# Patient Record
Sex: Female | Born: 1980 | Race: White | Hispanic: No | Marital: Married | Smoking: Never smoker
Health system: Southern US, Community
[De-identification: ages and names within clinical notes are randomized; demographics above are authoritative.]

## PROBLEM LIST (undated history)

## (undated) DIAGNOSIS — T7840XA Allergy, unspecified, initial encounter: Secondary | ICD-10-CM

## (undated) DIAGNOSIS — R112 Nausea with vomiting, unspecified: Secondary | ICD-10-CM

## (undated) DIAGNOSIS — F419 Anxiety disorder, unspecified: Secondary | ICD-10-CM

## (undated) DIAGNOSIS — J329 Chronic sinusitis, unspecified: Secondary | ICD-10-CM

## (undated) DIAGNOSIS — J45909 Unspecified asthma, uncomplicated: Secondary | ICD-10-CM

## (undated) DIAGNOSIS — E079 Disorder of thyroid, unspecified: Secondary | ICD-10-CM

## (undated) DIAGNOSIS — Z9889 Other specified postprocedural states: Secondary | ICD-10-CM

## (undated) DIAGNOSIS — B009 Herpesviral infection, unspecified: Secondary | ICD-10-CM

## (undated) DIAGNOSIS — A64 Unspecified sexually transmitted disease: Secondary | ICD-10-CM

## (undated) DIAGNOSIS — F4310 Post-traumatic stress disorder, unspecified: Secondary | ICD-10-CM

## (undated) DIAGNOSIS — G43009 Migraine without aura, not intractable, without status migrainosus: Secondary | ICD-10-CM

## (undated) DIAGNOSIS — G43019 Migraine without aura, intractable, without status migrainosus: Principal | ICD-10-CM

## (undated) DIAGNOSIS — E282 Polycystic ovarian syndrome: Secondary | ICD-10-CM

## (undated) HISTORY — DX: Post-traumatic stress disorder, unspecified: F43.10

## (undated) HISTORY — DX: Unspecified sexually transmitted disease: A64

## (undated) HISTORY — DX: Anxiety disorder, unspecified: F41.9

## (undated) HISTORY — PX: TONSILLECTOMY: SUR1361

## (undated) HISTORY — DX: Migraine without aura, not intractable, without status migrainosus: G43.009

## (undated) HISTORY — DX: Allergy, unspecified, initial encounter: T78.40XA

## (undated) HISTORY — DX: Herpesviral infection, unspecified: B00.9

## (undated) HISTORY — DX: Disorder of thyroid, unspecified: E07.9

## (undated) HISTORY — PX: COLPOSCOPY: SHX161

## (undated) HISTORY — DX: Migraine without aura, intractable, without status migrainosus: G43.019

## (undated) HISTORY — DX: Polycystic ovarian syndrome: E28.2

## (undated) HISTORY — DX: Chronic sinusitis, unspecified: J32.9

## (undated) HISTORY — DX: Unspecified asthma, uncomplicated: J45.909

---
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
## 2000-07-25 ENCOUNTER — Encounter: Admission: RE | Admit: 2000-07-25 | Discharge: 2000-07-25 | Payer: Self-pay | Admitting: *Deleted

## 2000-07-25 ENCOUNTER — Encounter: Payer: Self-pay | Admitting: *Deleted

## 2001-01-05 ENCOUNTER — Other Ambulatory Visit: Admission: RE | Admit: 2001-01-05 | Discharge: 2001-01-05 | Payer: Self-pay | Admitting: *Deleted

## 2013-02-04 DIAGNOSIS — F4310 Post-traumatic stress disorder, unspecified: Secondary | ICD-10-CM

## 2013-02-04 HISTORY — DX: Post-traumatic stress disorder, unspecified: F43.10

## 2015-07-06 HISTORY — PX: NASAL SINUS SURGERY: SHX719

## 2016-06-20 ENCOUNTER — Encounter: Payer: Self-pay | Admitting: Neurology

## 2016-06-20 ENCOUNTER — Encounter (INDEPENDENT_AMBULATORY_CARE_PROVIDER_SITE_OTHER): Payer: Self-pay

## 2016-06-20 ENCOUNTER — Ambulatory Visit (INDEPENDENT_AMBULATORY_CARE_PROVIDER_SITE_OTHER): Payer: PRIVATE HEALTH INSURANCE | Admitting: Neurology

## 2016-06-20 DIAGNOSIS — G43019 Migraine without aura, intractable, without status migrainosus: Secondary | ICD-10-CM | POA: Diagnosis not present

## 2016-06-20 HISTORY — DX: Migraine without aura, intractable, without status migrainosus: G43.019

## 2016-06-20 MED ORDER — PREDNISONE 5 MG PO TABS: ORAL_TABLET | ORAL | 0 refills | Status: DC

## 2016-06-20 MED ORDER — TOPIRAMATE 25 MG PO TABS: ORAL_TABLET | ORAL | 3 refills | Status: DC

## 2016-06-20 NOTE — Progress Notes (Signed)
Reason for visit: Headache  Referring physician: [name] is a 36 y.o. female  History of present illness:  Elizabeth Contreras is a 36 year old right-handed white female with a history of migraine headache that has been present off and on over the last 2 and half years. The patient just recently moved from [place] to this area about 2 weeks ago. Her headaches previously were about twice a week, but they have converted to daily headaches since her move. The patient usually has left-sided headaches, occasionally on the right. The headaches are in the frontotemporal regions, but more recently she has had dull achy sensations in the lower face as well with some tingling in the tongue at times on the left. The patient denies any electric sharp shocking pain. She has undergone a course of Naprosyn over 5 days which seemed to help her headaches, her headaches have become less severe after this trial. In the past, she has used propranolol which was not well tolerated. Imitrex was also not tolerated. The patient has been treated with Tylenol No. 3 previously. She indicates that she had MRI evaluation 2 years ago that was reportedly normal. The patient has had sinus surgery in June 2017. The patient denies any neck pain, she does have some photophobia and phonophobia with the headache. She may have nausea without vomiting. She denies any dizziness. She has not had any cognitive clouding with the headache. She reports that weather changes may have some impact on the headache. She denies any visual disturbances or problems with memory. She denies any issues with numbness or weakness of the arms or legs and denies problems with balance or difficulty controlling the bowels or the bladder. She comes to this office for an evaluation.  Past Medical History:  Diagnosis Date  . Asthma   . Herpes   . Migraine   . PCOS (polycystic ovarian syndrome)   . Sinusitis     Past Surgical History:    Procedure Laterality Date  . NASAL SINUS SURGERY  07/2015    Family History  Problem Relation Age of Onset  . Migraines Mother   . Aneurysm Mother   . Cancer Maternal Aunt        ovarian  . Cancer Paternal Uncle        colon    Social history:  reports that she has never smoked. She has never used smokeless tobacco. She reports that she drinks alcohol. She reports that she does not use drugs.  Medications:  Prior to Admission medications   Medication Sig Start Date End Date Taking? Authorizing Provider  5-Hydroxytryptophan (5-HTP) 100 MG CAPS Take by mouth.   Yes [provider]  acetaminophen (TYLENOL) 325 MG tablet Take 650 mg by mouth every 6 (six) hours as needed.   Yes [provider]  fexofenadine (ALLEGRA) 30 MG tablet Take 30 mg by mouth 2 (two) times daily.   Yes [provider]  fluticasone (FLONASE) 50 MCG/ACT nasal spray Place into both nostrils daily.   Yes [provider]  ibuprofen (ADVIL,MOTRIN) 200 MG tablet Take 200 mg by mouth every 6 (six) hours as needed.   Yes [provider]  naproxen (NAPROSYN) 500 MG tablet Take 500 mg by mouth 2 (two) times daily with a meal.   Yes [provider]  Probiotic Product (PROBIOTIC-10 PO) Take by mouth daily.   Yes [provider]  UNABLE TO FIND Med Name: allergy immune drops   Yes  [provider]  VALACYCLOVIR HCL PO Take 1 tablet by mouth daily.   Yes [provider]     No Known Allergies  ROS:  Out of a complete 14 system review of symptoms, the patient complains only of the following symptoms, and all other reviewed systems are negative.  Ringing in the ears Increased thirst Allergies Headache Anxiety, post-traumatic stress disorder Snoring  Blood pressure (!) 88/58, pulse (!) 59, height 5\' 6"  (1.676 m), weight 140 lb (63.5 kg).  Physical Exam  General: The patient is alert and cooperative at the time of the  examination.  Eyes: Pupils are equal, round, and reactive to light. Discs are flat bilaterally. Good venous pulsations are seen bilaterally.  Neck: The neck is supple, no carotid bruits are noted.  Respiratory: The respiratory examination is clear.  Cardiovascular: The cardiovascular examination reveals a regular rate and rhythm, no obvious murmurs or rubs are noted.   Neuromuscular: Range of movement of the cervical spine is full, no crepitus is noted in the temporal mandibular joints.  Skin: Extremities are without significant edema.  Neurologic Exam  Mental status: The patient is alert and oriented x 3 at the time of the examination. The patient has apparent normal recent and remote memory, with an apparently normal attention span and concentration ability.  Cranial nerves: Facial symmetry is present. There is good sensation of the face to pinprick and soft touch bilaterally. The strength of the facial muscles and the muscles to head turning and shoulder shrug are normal bilaterally. Speech is well enunciated, no aphasia or dysarthria is noted. Extraocular movements are full. Visual fields are full. The tongue is midline, and the patient has symmetric elevation of the soft palate. No obvious hearing deficits are noted.  Motor: The motor testing reveals 5 over 5 strength of all 4 extremities. Good symmetric motor tone is noted throughout.  Sensory: Sensory testing is intact to pinprick, soft touch, vibration sensation, and position sense on all 4 extremities. No evidence of extinction is noted.  Coordination: Cerebellar testing reveals good finger-nose-finger and heel-to-shin bilaterally.  Gait and station: Gait is normal. Tandem gait is normal. Romberg is negative. No drift is seen.  Reflexes: Deep tendon reflexes are symmetric and normal bilaterally. Toes are downgoing bilaterally.   Assessment/Plan:  1. Migraine headache, converted migraine, "lower half headaches"  The patient  will be placed on Topamax and the dose will be gradually increased over time. She will call for any dose adjustments. The patient is to take ibuprofen if needed for the headache, she will be given a 5 mg 6 day prednisone Dosepak. She will follow-up in 3 months.  Jill Alexanders MD 06/20/2016 10:18 AM  [facility] [address] [place], [place] [address]  Phone [phone] Fax [phone]

## 2016-06-20 NOTE — Patient Instructions (Signed)
   We will start prednisone for 6 days, and get on Topamax daily for the headache.  Topamax (topiramate) is a seizure medication that has an FDA approval for seizures and for migraine headache. Potential side effects of this medication include weight loss, cognitive slowing, tingling in the fingers and toes, and carbonated drinks will taste bad. If any significant side effects are noted on this drug, please contact our office.

## 2016-07-02 ENCOUNTER — Telehealth: Payer: Self-pay | Admitting: Neurology

## 2016-07-02 MED ORDER — ZONISAMIDE 25 MG PO CAPS: ORAL_CAPSULE | ORAL | 1 refills | Status: DC

## 2016-07-02 NOTE — Telephone Encounter (Signed)
Pt called back. She stated Dr Jannifer Franklin gave her a course of steroids for her migraine when she saw him 06/20/16. She completed course. Migraine went away and came back worse. She then went to urgent care, received steroid shot. She is wondering what other options she has for treatment.  She started topamax, but stopped. She only took for 3-4 nights and stopped. She felt "out of it", waking up with nausea, tingling in hands and feet. She could not tolerate medication. She has tried several triptans in the past but cannot tolerate triptans.  Advised I will send to CW,MD to advise. She verbalized understanding.

## 2016-07-02 NOTE — Telephone Encounter (Signed)
Called pt. LVM returning message. Gave [facility] phone number for call back. Need more information as to why she is requesting sooner appt

## 2016-07-02 NOTE — Telephone Encounter (Signed)
Pt requests sooner apt. Best call back is [phone]

## 2016-08-01 ENCOUNTER — Telehealth: Payer: Self-pay | Admitting: General Practice

## 2016-08-01 NOTE — Telephone Encounter (Signed)
Left message on VM to call the office.  Pt wouldlike to be a new pt.  Received new pt form.

## 2016-09-16 ENCOUNTER — Ambulatory Visit (INDEPENDENT_AMBULATORY_CARE_PROVIDER_SITE_OTHER): Payer: PRIVATE HEALTH INSURANCE | Admitting: Neurology

## 2016-09-16 ENCOUNTER — Encounter: Payer: Self-pay | Admitting: Neurology

## 2016-09-16 VITALS — BP 102/64 | HR 66 | Ht 66.0 in | Wt 142.0 lb

## 2016-09-16 DIAGNOSIS — G43019 Migraine without aura, intractable, without status migrainosus: Secondary | ICD-10-CM | POA: Diagnosis not present

## 2016-09-16 MED ORDER — NORTRIPTYLINE HCL 10 MG PO CAPS: 10.0000 mg | ORAL_CAPSULE | Freq: Every day | ORAL | 3 refills | Status: DC

## 2016-09-16 NOTE — Patient Instructions (Signed)
   We will start nortriptyline 10 mg at night.  Pamelor (nortriptyline) is an antidepressant medication that has many uses that may include headache, whiplash injuries, or for peripheral neuropathy pain. Side effects may include drowsiness, dry mouth, blurred vision, or constipation. As with any antidepressant medication, worsening depression may occur. If you had any significant side effects, please call our office. The full effects of this medication may take 7-10 days after starting the drug, or going up on the dose.

## 2016-09-16 NOTE — Progress Notes (Signed)
Reason for visit: Migraine headache  Elizabeth Contreras is an 36 y.o. female  History of present illness:  Elizabeth Contreras is a 36 year old right-handed white female with a history of migraine headaches. The patient had been placed on Topamax but she could not tolerate the drug and she went off the medication. Zonegran was called in but she never started the medication. She has been taking CBD oil and she will take ibuprofen if needed. Her headaches have reduced to 1 or 2 a week, she may occasionally have a severe headache, the last one was 6 weeks ago. The patient has had reduced stress after she has settled in from her move from [place] and she has gotten married. The patient in the past has tried propranolol but did not tolerate the drug. She returns for an evaluation.  Past Medical History:  Diagnosis Date  . Asthma   . Common migraine with intractable migraine 06/20/2016  . Herpes   . Migraine   . PCOS (polycystic ovarian syndrome)   . Sinusitis     Past Surgical History:  Procedure Laterality Date  . NASAL SINUS SURGERY  07/2015    Family History  Problem Relation Age of Onset  . Migraines Mother   . Aneurysm Mother   . Cancer Maternal Aunt        ovarian  . Cancer Paternal Uncle        colon    Social history:  reports that she has never smoked. She has never used smokeless tobacco. She reports that she drinks alcohol. She reports that she does not use drugs.    Allergies  Allergen Reactions  . Topamax [Topiramate]     Cognitive slowing    Medications:  Prior to Admission medications   Medication Sig Start Date End Date Taking? Authorizing Provider  5-Hydroxytryptophan (5-HTP) 100 MG CAPS Take by mouth.   Yes [provider]  acetaminophen (TYLENOL) 325 MG tablet Take 650 mg by mouth every 6 (six) hours as needed.   Yes [provider]  fexofenadine (ALLEGRA) 30 MG tablet Take 30 mg by mouth 2 (two) times daily.   Yes [provider]    fluticasone (FLONASE) 50 MCG/ACT nasal spray Place into both nostrils daily.   Yes [provider]  ibuprofen (ADVIL,MOTRIN) 200 MG tablet Take 200 mg by mouth every 6 (six) hours as needed.   Yes [provider]  Magnesium 400 MG TABS Take 1 tablet by mouth daily.   Yes [provider]  naproxen (NAPROSYN) 500 MG tablet Take 500 mg by mouth 2 (two) times daily with a meal.   Yes [provider]  Probiotic Product (PROBIOTIC-10 PO) Take by mouth daily.   Yes [provider]  UNABLE TO FIND Med Name: allergy immune drops   Yes [provider]  UNABLE TO FIND Take 1 Dose by mouth daily. Med Name: CBD oil 20MG  nightly   Yes [provider]  VALACYCLOVIR HCL PO Take 1 tablet by mouth daily.   Yes [provider]  zonisamide (ZONEGRAN) 25 MG capsule Take one tablet at night for one week, then take 2 tablets at night for one week, then take 3 tablets at night. Patient not taking: Reported on 09/16/2016 07/02/16   Kathrynn Ducking, MD    ROS:  Out of a complete 14 system review of symptoms, the patient complains only of the following symptoms, and all other reviewed systems are negative.  Headache Achy muscles  Anxiety Nausea  Blood pressure 102/64, pulse 66, height 5\' 6"  (1.676 m), weight 142 lb (64.4 kg).  Physical Exam  General: The patient is alert and cooperative at the time of the examination.  Skin: No significant peripheral edema is noted.   Neurologic Exam  Mental status: The patient is alert and oriented x 3 at the time of the examination. The patient has apparent normal recent and remote memory, with an apparently normal attention span and concentration ability.   Cranial nerves: Facial symmetry is present. Speech is normal, no aphasia or dysarthria is noted. Extraocular movements are full. Visual fields are full.  Motor: The patient has good strength in all 4 extremities.  Sensory examination: Soft  touch sensation is symmetric on the face, arms, and legs.  Coordination: The patient has good finger-nose-finger and heel-to-shin bilaterally.  Gait and station: The patient has a normal gait. Tandem gait is normal. Romberg is negative. No drift is seen.  Reflexes: Deep tendon reflexes are symmetric.   Assessment/Plan:  1. Migraine headache  The patient has been very sensitive to several medications, we will try low-dose nortriptyline taking 10 mg at night. The patient will call for any dose adjustments. She will follow-up in 4 months.  Elizabeth Alexanders MD 09/16/2016 11:08 AM  [facility] [address] [place], [place] [address]  Phone [phone] Fax [phone]

## 2016-11-06 ENCOUNTER — Ambulatory Visit (INDEPENDENT_AMBULATORY_CARE_PROVIDER_SITE_OTHER): Payer: PRIVATE HEALTH INSURANCE | Admitting: Family Medicine

## 2016-11-06 ENCOUNTER — Encounter: Payer: Self-pay | Admitting: Family Medicine

## 2016-11-06 VITALS — BP 110/78 | HR 67 | Temp 97.7°F | Ht 65.25 in | Wt 146.0 lb

## 2016-11-06 DIAGNOSIS — R5383 Other fatigue: Secondary | ICD-10-CM | POA: Diagnosis not present

## 2016-11-06 DIAGNOSIS — B009 Herpesviral infection, unspecified: Secondary | ICD-10-CM | POA: Diagnosis not present

## 2016-11-06 DIAGNOSIS — Z889 Allergy status to unspecified drugs, medicaments and biological substances status: Secondary | ICD-10-CM | POA: Diagnosis not present

## 2016-11-06 DIAGNOSIS — Z7689 Persons encountering health services in other specified circumstances: Secondary | ICD-10-CM

## 2016-11-06 DIAGNOSIS — Z8669 Personal history of other diseases of the nervous system and sense organs: Secondary | ICD-10-CM

## 2016-11-06 DIAGNOSIS — Z8639 Personal history of other endocrine, nutritional and metabolic disease: Secondary | ICD-10-CM

## 2016-11-06 DIAGNOSIS — F4310 Post-traumatic stress disorder, unspecified: Secondary | ICD-10-CM | POA: Diagnosis not present

## 2016-11-06 MED ORDER — FLUTICASONE PROPIONATE 50 MCG/ACT NA SUSP: 2.0000 | Freq: Every day | NASAL | 11 refills | Status: DC

## 2016-11-06 MED ORDER — VALACYCLOVIR HCL 500 MG PO TABS: 500.0000 mg | ORAL_TABLET | Freq: Every day | ORAL | 4 refills | Status: DC

## 2016-11-06 MED ORDER — ALPRAZOLAM 0.25 MG PO TABS: 0.2500 mg | ORAL_TABLET | Freq: Two times a day (BID) | ORAL | 0 refills | Status: DC | PRN

## 2016-11-06 NOTE — Patient Instructions (Addendum)
We have ordered labs at this visit. It can take up to 1-2 weeks for results and processing. If results require follow up or explanation, we will call you with instructions. Clinically stable results will be released to your MYCHART or sent to you via mail. If you have not heard from us or cannot find your results in MYCHART in 2 weeks please contact our office at [phone].    Fatigue Fatigue is feeling tired all of the time, a lack of energy, or a lack of motivation. Occasional or mild fatigue is often a normal response to activity or life in general. However, long-lasting (chronic) or extreme fatigue may indicate an underlying medical condition. Follow these instructions at home: Watch your fatigue for any changes. The following actions may help to lessen any discomfort you are feeling:  Talk to your health care provider about how much sleep you need each night. Try to get the required amount every night.  Take medicines only as directed by your health care provider.  Eat a healthy and nutritious diet. Ask your health care provider if you need help changing your diet.  Drink enough fluid to keep your urine clear or pale yellow.  Practice ways of relaxing, such as yoga, meditation, massage therapy, or acupuncture.  Exercise regularly.  Change situations that cause you stress. Try to keep your work and personal routine reasonable.  Do not abuse illegal drugs.  Limit alcohol intake to no more than 1 drink per day for nonpregnant women and 2 drinks per day for men. One drink equals 12 ounces of beer, 5 ounces of wine, or 1 ounces of hard liquor.  Take a multivitamin, if directed by your health care provider.  Contact a health care provider if:  Your fatigue does not get better.  You have a fever.  You have unintentional weight loss or gain.  You have headaches.  You have difficulty: ? Falling asleep. ? Sleeping throughout the night.  You feel angry, guilty, anxious, or  sad.  You are unable to have a bowel movement (constipation).  You skin is dry.  Your legs or another part of your body is swollen. Get help right away if:  You feel confused.  Your vision is blurry.  You feel faint or pass out.  You have a severe headache.  You have severe abdominal, pelvic, or back pain.  You have chest pain, shortness of breath, or an irregular or fast heartbeat.  You are unable to urinate or you urinate less than normal.  You develop abnormal bleeding, such as bleeding from the rectum, vagina, nose, lungs, or nipples.  You vomit blood.  You have thoughts about harming yourself or committing suicide.  You are worried that you might harm someone else. This information is not intended to replace advice given to you by your health care provider. Make sure you discuss any questions you have with your health care provider. Document Released: 11/18/2006 Document Revised: 06/29/2015 Document Reviewed: 05/25/2013 Elsevier Interactive Patient Education  2018 [employer] Health Maintenance, Female Adopting a healthy lifestyle and getting preventive care can go a long way to promote health and wellness. Talk with your health care provider about what schedule of regular examinations is right for you. This is a good chance for you to check in with your provider about disease prevention and staying healthy. In between checkups, there are plenty of things you can do on your own. Experts have done a lot of research about which lifestyle changes   and preventive measures are most likely to keep you healthy. Ask your health care provider for more information. Weight and diet Eat a healthy diet  Be sure to include plenty of vegetables, fruits, low-fat dairy products, and lean protein.  Do not eat a lot of foods high in solid fats, added sugars, or salt.  Get regular exercise. This is one of the most important things you can do for your health. ? Most adults should  exercise for at least 150 minutes each week. The exercise should increase your heart rate and make you sweat (moderate-intensity exercise). ? Most adults should also do strengthening exercises at least twice a week. This is in addition to the moderate-intensity exercise.  Maintain a healthy weight  Body mass index (BMI) is a measurement that can be used to identify possible weight problems. It estimates body fat based on height and weight. Your health care provider can help determine your BMI and help you achieve or maintain a healthy weight.  For females 20 years of age and older: ? A BMI below 18.5 is considered underweight. ? A BMI of 18.5 to 24.9 is normal. ? A BMI of 25 to 29.9 is considered overweight. ? A BMI of 30 and above is considered obese.  Watch levels of cholesterol and blood lipids  You should start having your blood tested for lipids and cholesterol at 36 years of age, then have this test every 5 years.  You may need to have your cholesterol levels checked more often if: ? Your lipid or cholesterol levels are high. ? You are older than 36 years of age. ? You are at high risk for heart disease.  Cancer screening Lung Cancer  Lung cancer screening is recommended for adults 55-80 years old who are at high risk for lung cancer because of a history of smoking.  A yearly low-dose CT scan of the lungs is recommended for people who: ? Currently smoke. ? Have quit within the past 15 years. ? Have at least a 30-pack-year history of smoking. A pack year is smoking an average of one pack of cigarettes a day for 1 year.  Yearly screening should continue until it has been 15 years since you quit.  Yearly screening should stop if you develop a health problem that would prevent you from having lung cancer treatment.  Breast Cancer  Practice breast self-awareness. This means understanding how your breasts normally appear and feel.  It also means doing regular breast  self-exams. Let your health care provider know about any changes, no matter how small.  If you are in your 20s or 30s, you should have a clinical breast exam (CBE) by a health care provider every 1-3 years as part of a regular health exam.  If you are 40 or older, have a CBE every year. Also consider having a breast X-ray (mammogram) every year.  If you have a family history of breast cancer, talk to your health care provider about genetic screening.  If you are at high risk for breast cancer, talk to your health care provider about having an MRI and a mammogram every year.  Breast cancer gene (BRCA) assessment is recommended for women who have family members with BRCA-related cancers. BRCA-related cancers include: ? Breast. ? Ovarian. ? Tubal. ? Peritoneal cancers.  Results of the assessment will determine the need for genetic counseling and BRCA1 and BRCA2 testing.  Cervical Cancer Your health care provider may recommend that you be screened regularly for cancer   of the pelvic organs (ovaries, uterus, and vagina). This screening involves a pelvic examination, including checking for microscopic changes to the surface of your cervix (Pap test). You may be encouraged to have this screening done every 3 years, beginning at age 21.  For women ages 30-65, health care providers may recommend pelvic exams and Pap testing every 3 years, or they may recommend the Pap and pelvic exam, combined with testing for human papilloma virus (HPV), every 5 years. Some types of HPV increase your risk of cervical cancer. Testing for HPV may also be done on women of any age with unclear Pap test results.  Other health care providers may not recommend any screening for nonpregnant women who are considered low risk for pelvic cancer and who do not have symptoms. Ask your health care provider if a screening pelvic exam is right for you.  If you have had past treatment for cervical cancer or a condition that could  lead to cancer, you need Pap tests and screening for cancer for at least 20 years after your treatment. If Pap tests have been discontinued, your risk factors (such as having a new sexual partner) need to be reassessed to determine if screening should resume. Some women have medical problems that increase the chance of getting cervical cancer. In these cases, your health care provider may recommend more frequent screening and Pap tests.  Colorectal Cancer  This type of cancer can be detected and often prevented.  Routine colorectal cancer screening usually begins at 36 years of age and continues through 36 years of age.  Your health care provider may recommend screening at an earlier age if you have risk factors for colon cancer.  Your health care provider may also recommend using home test kits to check for hidden blood in the stool.  A small camera at the end of a tube can be used to examine your colon directly (sigmoidoscopy or colonoscopy). This is done to check for the earliest forms of colorectal cancer.  Routine screening usually begins at age 50.  Direct examination of the colon should be repeated every 5-10 years through 36 years of age. However, you may need to be screened more often if early forms of precancerous polyps or small growths are found.  Skin Cancer  Check your skin from head to toe regularly.  Tell your health care provider about any new moles or changes in moles, especially if there is a change in a mole's shape or color.  Also tell your health care provider if you have a mole that is larger than the size of a pencil eraser.  Always use sunscreen. Apply sunscreen liberally and repeatedly throughout the day.  Protect yourself by wearing long sleeves, pants, a wide-brimmed hat, and sunglasses whenever you are outside.  Heart disease, diabetes, and high blood pressure  High blood pressure causes heart disease and increases the risk of stroke. High blood pressure  is more likely to develop in: ? People who have blood pressure in the high end of the normal range (130-139/85-89 mm Hg). ? People who are overweight or obese. ? People who are African American.  If you are 18-39 years of age, have your blood pressure checked every 3-5 years. If you are 40 years of age or older, have your blood pressure checked every year. You should have your blood pressure measured twice-once when you are at a hospital or clinic, and once when you are not at a hospital or clinic. Record the   average of the two measurements. To check your blood pressure when you are not at a hospital or clinic, you can use: ? An automated blood pressure machine at a pharmacy. ? A home blood pressure monitor.  If you are between 55 years and 79 years old, ask your health care provider if you should take aspirin to prevent strokes.  Have regular diabetes screenings. This involves taking a blood sample to check your fasting blood sugar level. ? If you are at a normal weight and have a low risk for diabetes, have this test once every three years after 36 years of age. ? If you are overweight and have a high risk for diabetes, consider being tested at a younger age or more often. Preventing infection Hepatitis B  If you have a higher risk for hepatitis B, you should be screened for this virus. You are considered at high risk for hepatitis B if: ? You were born in a country where hepatitis B is common. Ask your health care provider which countries are considered high risk. ? Your parents were born in a high-risk country, and you have not been immunized against hepatitis B (hepatitis B vaccine). ? You have HIV or AIDS. ? You use needles to inject street drugs. ? You live with someone who has hepatitis B. ? You have had sex with someone who has hepatitis B. ? You get hemodialysis treatment. ? You take certain medicines for conditions, including cancer, organ transplantation, and autoimmune  conditions.  Hepatitis C  Blood testing is recommended for: ? Everyone born from 1945 through 1965. ? Anyone with known risk factors for hepatitis C.  Sexually transmitted infections (STIs)  You should be screened for sexually transmitted infections (STIs) including gonorrhea and chlamydia if: ? You are sexually active and are younger than 36 years of age. ? You are older than 36 years of age and your health care provider tells you that you are at risk for this type of infection. ? Your sexual activity has changed since you were last screened and you are at an increased risk for chlamydia or gonorrhea. Ask your health care provider if you are at risk.  If you do not have HIV, but are at risk, it may be recommended that you take a prescription medicine daily to prevent HIV infection. This is called pre-exposure prophylaxis (PrEP). You are considered at risk if: ? You are sexually active and do not regularly use condoms or know the HIV status of your partner(s). ? You take drugs by injection. ? You are sexually active with a partner who has HIV.  Talk with your health care provider about whether you are at high risk of being infected with HIV. If you choose to begin PrEP, you should first be tested for HIV. You should then be tested every 3 months for as long as you are taking PrEP. Pregnancy  If you are premenopausal and you may become pregnant, ask your health care provider about preconception counseling.  If you may become pregnant, take 400 to 800 micrograms (mcg) of folic acid every day.  If you want to prevent pregnancy, talk to your health care provider about birth control (contraception). Osteoporosis and menopause  Osteoporosis is a disease in which the bones lose minerals and strength with aging. This can result in serious bone fractures. Your risk for osteoporosis can be identified using a bone density scan.  If you are 65 years of age or older, or if you are at   risk for  osteoporosis and fractures, ask your health care provider if you should be screened.  Ask your health care provider whether you should take a calcium or vitamin D supplement to lower your risk for osteoporosis.  Menopause may have certain physical symptoms and risks.  Hormone replacement therapy may reduce some of these symptoms and risks. Talk to your health care provider about whether hormone replacement therapy is right for you. Follow these instructions at home:  Schedule regular health, dental, and eye exams.  Stay current with your immunizations.  Do not use any tobacco products including cigarettes, chewing tobacco, or electronic cigarettes.  If you are pregnant, do not drink alcohol.  If you are breastfeeding, limit how much and how often you drink alcohol.  Limit alcohol intake to no more than 1 drink per day for nonpregnant women. One drink equals 12 ounces of beer, 5 ounces of wine, or 1 ounces of hard liquor.  Do not use street drugs.  Do not share needles.  Ask your health care provider for help if you need support or information about quitting drugs.  Tell your health care provider if you often feel depressed.  Tell your health care provider if you have ever been abused or do not feel safe at home. This information is not intended to replace advice given to you by your health care provider. Make sure you discuss any questions you have with your health care provider. Document Released: 08/06/2010 Document Revised: 06/29/2015 Document Reviewed: 10/25/2014 Elsevier Interactive Patient Education  2018 [employer]  Recurrent Migraine Headache A migraine headache is very bad, throbbing pain that is usually on one side of your head. Recurrent migraines keep coming back (recurring). Talk with your doctor about what things may bring on (trigger) your migraine headaches. Follow these instructions at home: Medicines  Take over-the-counter and prescription medicines only  as told by your doctor.  Do not drive or use heavy machinery while taking prescription pain medicine. Lifestyle  Do not use any products that contain nicotine or tobacco, such as cigarettes and e-cigarettes. If you need help quitting, ask your doctor.  Limit alcohol intake to no more than 1 drink a day for nonpregnant women and 2 drinks a day for men. One drink equals 12 oz of beer, 5 oz of wine, or 1 oz of hard liquor.  Get 7-9 hours of sleep each night.  Lessen any stress in your life. Ask your doctor about ways to lower your stress.  Stay at a healthy weight. Talk with your doctor if you need help losing weight.  Get regular exercise. General instructions  Keep a journal to find out if certain things bring on migraine headaches. For example, write down: ? What you eat and drink. ? How much sleep you get. ? Any change to your diet or medicines.  Lie down in a dark, quiet room when you have a migraine.  Try placing a cool towel over your head when you have a migraine.  Keep lights dim if bright lights bother you or make your migraines worse.  Keep all follow-up visits as told by your doctor. This is important. Contact a doctor if:  Medicine does not help your migraines.  Your pain keeps coming back.  You have a fever.  You have weight loss without trying. Get help right away if:  Your migraine becomes really bad and medicine does not help.  You have a stiff neck.  You have trouble seeing.  Your muscles   are weak or you lose control of your muscles.  You lose your balance or have trouble walking.  You feel like you will pass out (faint) or you pass out.  You have really bad symptoms that are different than your first symptoms.  You start having sudden, very bad headaches that last for one second or less, like a thunderclap. Summary  A migraine headache is very bad, throbbing pain that is usually on one side of your head.  Talk with your doctor about what  things may bring on (trigger) your migraine headaches.  Take over-the-counter and prescription medicines only as told by your doctor.  Lie down in a dark, quiet room when you have a migraine.  Keep a journal about what you eat and drink, how much sleep you get, and any changes to your medicines. This can help you find out if certain things make you have migraine headaches. This information is not intended to replace advice given to you by your health care provider. Make sure you discuss any questions you have with your health care provider. Document Released: 10/31/2007 Document Revised: 12/15/2015 Document Reviewed: 12/15/2015 Elsevier Interactive Patient Education  2017 [employer]  Recurrent Migraine Headache A migraine headache is very bad, throbbing pain that is usually on one side of your head. Recurrent migraines keep coming back (recurring). Talk with your doctor about what things may bring on (trigger) your migraine headaches. Follow these instructions at home: Medicines  Take over-the-counter and prescription medicines only as told by your doctor.  Do not drive or use heavy machinery while taking prescription pain medicine. Lifestyle  Do not use any products that contain nicotine or tobacco, such as cigarettes and e-cigarettes. If you need help quitting, ask your doctor.  Limit alcohol intake to no more than 1 drink a day for nonpregnant women and 2 drinks a day for men. One drink equals 12 oz of beer, 5 oz of wine, or 1 oz of hard liquor.  Get 7-9 hours of sleep each night.  Lessen any stress in your life. Ask your doctor about ways to lower your stress.  Stay at a healthy weight. Talk with your doctor if you need help losing weight.  Get regular exercise. General instructions  Keep a journal to find out if certain things bring on migraine headaches. For example, write down: ? What you eat and drink. ? How much sleep you get. ? Any change to your diet or  medicines.  Lie down in a dark, quiet room when you have a migraine.  Try placing a cool towel over your head when you have a migraine.  Keep lights dim if bright lights bother you or make your migraines worse.  Keep all follow-up visits as told by your doctor. This is important. Contact a doctor if:  Medicine does not help your migraines.  Your pain keeps coming back.  You have a fever.  You have weight loss without trying. Get help right away if:  Your migraine becomes really bad and medicine does not help.  You have a stiff neck.  You have trouble seeing.  Your muscles are weak or you lose control of your muscles.  You lose your balance or have trouble walking.  You feel like you will pass out (faint) or you pass out.  You have really bad symptoms that are different than your first symptoms.  You start having sudden, very bad headaches that last for one second or less, like a thunderclap. Summary  A migraine headache is very bad, throbbing pain that is usually on one side of your head.  Talk with your doctor about what things may bring on (trigger) your migraine headaches.  Take over-the-counter and prescription medicines only as told by your doctor.  Lie down in a dark, quiet room when you have a migraine.  Keep a journal about what you eat and drink, how much sleep you get, and any changes to your medicines. This can help you find out if certain things make you have migraine headaches. This information is not intended to replace advice given to you by your health care provider. Make sure you discuss any questions you have with your health care provider. Document Released: 10/31/2007 Document Revised: 12/15/2015 Document Reviewed: 12/15/2015 Elsevier Interactive Patient Education  2017 [employer]  
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
## 2016-11-07 LAB — BASIC METABOLIC PANEL
BUN: 11 mg/dL (ref 6–23)
CALCIUM: 9.3 mg/dL (ref 8.4–10.5)
CO2: 27 mEq/L (ref 19–32)
Chloride: 102 mEq/L (ref 96–112)
Creatinine, Ser: 0.72 mg/dL (ref 0.40–1.20)
GFR: 97.09 mL/min (ref >60.00)
GLUCOSE: 91 mg/dL (ref 70–99)
Potassium: 3.9 mEq/L (ref 3.5–5.1)
SODIUM: 137 meq/L (ref 135–145)

## 2016-11-07 LAB — CBC WITH DIFFERENTIAL/PLATELET
BASOS PCT: 0.2 % (ref 0.0–3.0)
Basophils Absolute: 0 10*3/uL (ref 0.0–0.1)
EOS ABS: 0.4 10*3/uL (ref 0.0–0.7)
EOS PCT: 5 % (ref 0.0–5.0)
HEMATOCRIT: 40.7 % (ref 36.0–46.0)
HEMOGLOBIN: 13.3 g/dL (ref 12.0–15.0)
LYMPHS PCT: 35.7 % (ref 12.0–46.0)
Lymphs Abs: 2.9 10*3/uL (ref 0.7–4.0)
MCHC: 32.8 g/dL (ref 30.0–36.0)
MCV: 90.7 fl (ref 78.0–100.0)
Monocytes Absolute: 0.4 10*3/uL (ref 0.1–1.0)
Monocytes Relative: 5.1 % (ref 3.0–12.0)
Neutro Abs: 4.4 10*3/uL (ref 1.4–7.7)
Neutrophils Relative %: 54 % (ref 43.0–77.0)
Platelets: 243 10*3/uL (ref 150.0–400.0)
RBC: 4.48 Mil/uL (ref 3.87–5.11)
RDW: 13.2 % (ref 11.5–15.5)
WBC: 8.2 10*3/uL (ref 4.0–10.5)

## 2016-11-07 LAB — FERRITIN: FERRITIN: 38.8 ng/mL (ref 10.0–291.0)

## 2016-11-07 LAB — VITAMIN B12: VITAMIN B 12: 653 pg/mL (ref 211–911)

## 2016-11-07 LAB — FOLATE: Folate: >23.9 ng/mL (ref >5.9)

## 2016-11-07 LAB — VITAMIN D 25 HYDROXY (VIT D DEFICIENCY, FRACTURES): VITD: 24.95 ng/mL — AB (ref 30.00–100.00)

## 2016-11-07 LAB — T4, FREE: Free T4: 0.66 ng/dL (ref 0.60–1.60)

## 2016-11-07 LAB — TSH: TSH: 5.62 u[IU]/mL — ABNORMAL HIGH (ref 0.35–4.50)

## 2016-11-11 ENCOUNTER — Telehealth: Payer: Self-pay | Admitting: Obstetrics and Gynecology

## 2016-11-11 NOTE — Telephone Encounter (Signed)
Called and left a message for patient to call back to schedule a new patient doctor referral to establish care.

## 2016-11-12 NOTE — Telephone Encounter (Signed)
Called and left a message for patient to call back to schedule a new patient doctor referral to establish care.

## 2016-11-12 NOTE — Telephone Encounter (Signed)
Patient returning your call.

## 2016-11-13 ENCOUNTER — Other Ambulatory Visit: Payer: Self-pay | Admitting: Family Medicine

## 2016-11-13 MED ORDER — VITAMIN D (ERGOCALCIFEROL) 1.25 MG (50000 UNIT) PO CAPS: 50000.0000 [IU] | ORAL_CAPSULE | ORAL | 0 refills | Status: DC

## 2016-11-13 NOTE — Telephone Encounter (Signed)
Called and left a message for patient to call back to schedule a new patient doctor referral to establish care.

## 2016-11-14 NOTE — Progress Notes (Signed)
Synthroid (levothyroixine).  Depends.

## 2016-11-20 ENCOUNTER — Other Ambulatory Visit: Payer: Self-pay | Admitting: Family Medicine

## 2016-11-20 ENCOUNTER — Encounter: Payer: Self-pay | Admitting: Obstetrics and Gynecology

## 2016-11-20 ENCOUNTER — Ambulatory Visit (INDEPENDENT_AMBULATORY_CARE_PROVIDER_SITE_OTHER): Payer: PRIVATE HEALTH INSURANCE | Admitting: Obstetrics and Gynecology

## 2016-11-20 ENCOUNTER — Other Ambulatory Visit (HOSPITAL_COMMUNITY)
Admission: RE | Admit: 2016-11-20 | Discharge: 2016-11-20 | Disposition: A | Discharge status: Home / Self Care | Payer: PRIVATE HEALTH INSURANCE | Source: Ambulatory Visit | Attending: Obstetrics and Gynecology | Admitting: Obstetrics and Gynecology

## 2016-11-20 VITALS — BP 100/58 | HR 72 | Resp 14 | Ht 66.0 in | Wt 146.0 lb

## 2016-11-20 DIAGNOSIS — Z01419 Encounter for gynecological examination (general) (routine) without abnormal findings: Secondary | ICD-10-CM

## 2016-11-20 DIAGNOSIS — Z124 Encounter for screening for malignant neoplasm of cervix: Secondary | ICD-10-CM | POA: Insufficient documentation

## 2016-11-20 DIAGNOSIS — N941 Unspecified dyspareunia: Secondary | ICD-10-CM | POA: Diagnosis not present

## 2016-11-20 DIAGNOSIS — E039 Hypothyroidism, unspecified: Secondary | ICD-10-CM

## 2016-11-20 DIAGNOSIS — Z9889 Other specified postprocedural states: Secondary | ICD-10-CM

## 2016-11-20 DIAGNOSIS — E038 Other specified hypothyroidism: Secondary | ICD-10-CM

## 2016-11-20 NOTE — Addendum Note (Signed)
Addended by: Dorothy Spark on: 11/20/2016 04:37 PM   Modules accepted: Orders

## 2016-11-20 NOTE — Patient Instructions (Signed)

## 2016-11-20 NOTE — Progress Notes (Signed)
36 y.o. G0P0000 MarriedCaucasianF here for annual exam.  She has a h/o PCOS. Cycles q 4.5-7 weeks. No BTB. Cramps are mostly tolerable. Helped with OTC ibuprofen.  Not using contraception, husband with female factor infertility. They don't want kids. She has hair growth that she gets rid of.  She recently had an elevated TSH and subclinical hypothyroidism. She is going in to see her provider to discuss possible treatment. Her weight is up 15 lbs in the last year. She has been working out more and eating better. She has some fatigue. Slight constipation. The last 3 years she has had irregular paps, leep 3 years ago, colpo last year was okay.  Period Duration (Days): 3 days- only  Period Pattern: (!) Irregular Menstrual Flow: Light, Heavy Menstrual Control: Tampon Menstrual Control Change Freq (Hours): changes tampon 3-4 tmes a day Dysmenorrhea: (!) Moderate  She c/o intermittent dyspareunia, can be entry or deep. Gets better with time. Occasionally notices spotting after intercourse. Thinks mostly a lubrication issue and size issue.   Patient's last menstrual period was 11/02/2016 (approximate).          Sexually active: Yes.    The current method of family planning is none.    Exercising: No.  The patient does not participate in regular exercise at present. Smoker:  no  Health Maintenance: Pap:  10/2015 abnormal + HPV had colposcopy History of abnormal Pap:  LEEP in 2015, Colposcopy last year TDaP:  2009, doesn't want it today.  Gardasil: no    reports that she has never smoked. She has never used smokeless tobacco. She reports that she drinks alcohol. She reports that she does not use drugs. She drinks 2 drinks a month. 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 Just got married in June  Past Medical History:  Diagnosis Date  . Asthma   . Common migraine with intractable migraine 06/20/2016  . Herpes   . Migraine without aura   . PCOS (polycystic ovarian  syndrome)   . PTSD (post-traumatic stress disorder) 2015  . Sinusitis   . 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 Thyroid disease     Past Surgical History:  Procedure Laterality Date  . COLPOSCOPY    . NASAL SINUS SURGERY  07/2015    Current Outpatient Prescriptions  Medication Sig Dispense Refill  . 5-Hydroxytryptophan (5-HTP) 100 MG CAPS Take by mouth.    Marland Kitchen acetaminophen (TYLENOL) 325 MG tablet Take 650 mg by mouth every 6 (six) hours as needed.    . ALPRAZolam (XANAX) 0.25 MG tablet Take 1 tablet (0.25 mg total) by mouth 2 (two) times daily as needed for anxiety. 20 tablet 0  . B Complex Vitamins (VITAMIN-B COMPLEX) TABS     . fexofenadine (ALLEGRA) 30 MG tablet Take 30 mg by mouth 2 (two) times daily.    . fluticasone (FLONASE) 50 MCG/ACT nasal spray Place 2 sprays into both nostrils daily. 15.8 g 11  . ibuprofen (ADVIL,MOTRIN) 200 MG tablet Take 200 mg by mouth every 6 (six) hours as needed.    . Magnesium 400 MG TABS Take 1 tablet by mouth daily.    . Misc Natural Products (ADRENAL) CAPS     . Probiotic Product (PROBIOTIC-10 PO) Take by mouth daily.    Marland Kitchen UNABLE TO FIND Take 1 Dose by mouth daily. Med Name: CBD oil 20MG  nightly    . valACYclovir (VALTREX) 500 MG tablet Take  1 tablet (500 mg total) by mouth daily. 30 tablet 4  . Vitamin D, Ergocalciferol, (DRISDOL) 50000 units CAPS capsule Take 1 capsule (50,000 Units total) by mouth every 7 (seven) days. 8 capsule 0   No current facility-administered medications for this visit.     Family History  Problem Relation Age of Onset  . Migraines Mother   . Aneurysm Mother   . Cancer Maternal Aunt        ovarian  . Cancer Paternal Uncle        colon  MAunt was in her early 63's.  Paternal uncle was in his 99's  Review of Systems  Constitutional: Negative.   HENT: Negative.   Eyes: Negative.   Respiratory: Negative.   Cardiovascular: Negative.   Gastrointestinal: Negative.   Endocrine: Negative.    Genitourinary: Positive for dyspareunia.  Musculoskeletal: Negative.   Skin: Negative.   Allergic/Immunologic: Negative.   Neurological: Negative.   Psychiatric/Behavioral: Negative.     Exam:   BP (!) 100/58 (BP Location: Right Arm, Patient Position: Sitting, Cuff Size: Normal)   Pulse 72   Resp 14   Ht 5\' 6"  (1.676 m)   Wt 146 lb (66.2 kg)   LMP 11/02/2016 (Approximate)   BMI 23.57 kg/m   Weight change: @WEIGHTCHANGE @ Height:   Height: 5\' 6"  (167.6 cm)  Ht Readings from Last 3 Encounters:  11/20/16 5\' 6"  (1.676 m)  11/06/16 5' 5.25" (1.657 m)  09/16/16 5\' 6"  (1.676 m)    General appearance: alert, cooperative and appears stated age Head: Normocephalic, without obvious abnormality, atraumatic Neck: no adenopathy, supple, symmetrical, trachea midline and thyroid normal to inspection and palpation Lungs: clear to auscultation bilaterally Cardiovascular: regular rate and rhythm Breasts: normal appearance, no masses or tenderness Abdomen: soft, non-tender; non distended,  no masses,  no organomegaly Extremities: extremities normal, atraumatic, no cyanosis or edema Skin: Skin color, texture, turgor normal. No rashes or lesions Lymph nodes: Cervical, supraclavicular, and axillary nodes normal. No abnormal inguinal nodes palpated Neurologic: Grossly normal   Pelvic: External genitalia:  no lesions              Urethra:  normal appearing urethra with no masses, tenderness or lesions              Bartholins and Skenes: normal                 Vagina: normal appearing vagina with normal color and discharge, no lesions              Cervix: no cervical motion tenderness and no lesions               Bimanual Exam:  Uterus:  normal size, contour, position, consistency, mobility, non-tender              Adnexa: no mass, fullness, tenderness               Rectovaginal: Confirms               Anus:  normal sphincter tone, no lesions Pelvic floor: not tender  Chaperone was present  for exam.  A:  Well Woman with normal exam  H/O LEEP  H/O abnormal pap last year  Dyspareunia  P:   Pap with hpv  Get copy of records of paps  Discussed breast self exam  Discussed calcium and vit D intake  Use a lubricant, she should control rate and depth of penetration.   Information on gardasil given.

## 2016-11-21 ENCOUNTER — Ambulatory Visit (INDEPENDENT_AMBULATORY_CARE_PROVIDER_SITE_OTHER): Payer: PRIVATE HEALTH INSURANCE | Admitting: Family Medicine

## 2016-11-21 ENCOUNTER — Encounter: Payer: Self-pay | Admitting: Family Medicine

## 2016-11-21 VITALS — BP 98/80 | HR 67

## 2016-11-21 DIAGNOSIS — E559 Vitamin D deficiency, unspecified: Secondary | ICD-10-CM

## 2016-11-21 DIAGNOSIS — E039 Hypothyroidism, unspecified: Secondary | ICD-10-CM | POA: Diagnosis not present

## 2016-11-21 DIAGNOSIS — E038 Other specified hypothyroidism: Secondary | ICD-10-CM

## 2016-11-21 NOTE — Progress Notes (Signed)
Subjective:    Patient ID: Elizabeth Contreras, female    DOB: 10/08/1980, 36 y.o.   MRN: [id number]  No chief complaint on file.   HPI Patient was seen today for f/u on labs.  At last visit pt mentioned decreased energy, was found to have vit D deficiency and subclinical hypothyroidism.  Pt just started taking Ergocalciferol 50,000 IU.  Pt wanted to further discuss subclinical hypothyroidism.  Since last visit, she has been working out more and has noticed a slight increase in wt and energy.  Past Medical History:  Diagnosis Date  . Asthma   . Common migraine with intractable migraine 06/20/2016  . Herpes   . Migraine without aura   . PCOS (polycystic ovarian syndrome)   . PTSD (post-traumatic stress disorder) 2015  . Sinusitis   . 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 Thyroid disease     Allergies  Allergen Reactions  . Topamax [Topiramate]     Cognitive slowing    ROS General: Denies fever, chills, night sweats, changes in weight, changes in appetite HEENT: Denies headaches, ear pain, changes in vision, rhinorrhea, sore throat CV: Denies CP, palpitations, SOB, orthopnea Pulm: Denies SOB, cough, wheezing GI: Denies abdominal pain, nausea, vomiting, diarrhea, constipation GU: Denies dysuria, hematuria, frequency, vaginal discharge Msk: Denies muscle cramps, joint pains Neuro: Denies weakness, numbness, tingling Skin: Denies rashes, bruising Psych: Denies depression, anxiety, hallucinations     Objective:    Blood pressure 98/80, pulse 67, last menstrual period 11/02/2016.   Gen. Pleasant, well-nourished, in no distress, normal affect   HEENT: [place]/AT, face symmetric, no scleral icterus, PERRLA, nares patent without drainage. Lungs: no accessory muscle use, CTAB Cardiovascular: RRR, no m/r/g, no peripheral edema Musculoskeletal: No deformities, no cyanosis or clubbing, normal tone Neuro:  A&Ox3, CN II-XII intact, normal gait Skin:  Warm, no lesions/  rash   Wt Readings from Last 3 Encounters:  11/20/16 146 lb (66.2 kg)  11/06/16 146 lb (66.2 kg)  09/16/16 142 lb (64.4 kg)    Lab Results  Component Value Date   WBC 8.2 11/06/2016   HGB 13.3 11/06/2016   HCT 40.7 11/06/2016   PLT 243.0 11/06/2016   GLUCOSE 91 11/06/2016   NA 137 11/06/2016   K 3.9 11/06/2016   CL 102 11/06/2016   CREATININE 0.72 11/06/2016   BUN 11 11/06/2016   CO2 27 11/06/2016   TSH 5.62 (H) 11/06/2016    Assessment/Plan:  Subclinical hypothyroidism  -Questions answered to satisfaction. -Pt wishes to wait on medication at this time. -Given information on topic -Will recheck TSH, T4 in 6 wks from time of initial labs.  Future lab orders placed this visit. - Plan: future TSH, T4, free  Vitamin D deficiency -Continue Ergocalciferol 50, 000 IU weekly x 8 wks  F/u prn

## 2016-11-21 NOTE — Patient Instructions (Addendum)
Vitamin D Deficiency °Vitamin D deficiency is when your body does not have enough vitamin D. Vitamin D is important to your body for many reasons: °· It helps the body to absorb two important minerals, called calcium and phosphorus. °· It plays a role in bone health. °· It may help to prevent some diseases, such as diabetes and multiple sclerosis. °· It plays a role in muscle function, including heart function. ° °You can get vitamin D by: °· Eating foods that naturally contain vitamin D. °· Eating or drinking milk or other dairy products that have vitamin D added to them. °· Taking a vitamin D supplement or a multivitamin supplement that contains vitamin D. °· Being in the sun. Your body naturally makes vitamin D when your skin is exposed to sunlight. Your body changes the sunlight into a form of the vitamin that the body can use. ° °If vitamin D deficiency is severe, it can cause a condition in which your bones become soft. In adults, this condition is called osteomalacia. In children, this condition is called rickets. °What are the causes? °Vitamin D deficiency may be caused by: °· Not eating enough foods that contain vitamin D. °· Not getting enough sun exposure. °· Having certain digestive system diseases that make it difficult for your body to absorb vitamin D. These diseases include Crohn disease, chronic pancreatitis, and cystic fibrosis. °· Having a surgery in which a part of the stomach or a part of the small intestine is removed. °· Being obese. °· Having chronic kidney disease or liver disease. ° °What increases the risk? °This condition is more likely to develop in: °· Older people. °· People who do not spend much time outdoors. °· People who live in a long-term care facility. °· People who have had broken bones. °· People with weak or thin bones (osteoporosis). °· People who have a disease or condition that changes how the body absorbs vitamin D. °· People who have dark skin. °· People who take certain  medicines, such as steroid medicines or certain seizure medicines. °· People who are overweight or obese. ° °What are the signs or symptoms? °In mild cases of vitamin D deficiency, there may not be any symptoms. If the condition is severe, symptoms may include: °· Bone pain. °· Muscle pain. °· Falling often. °· Broken bones caused by a minor injury. ° °How is this diagnosed? °This condition is usually diagnosed with a blood test. °How is this treated? °Treatment for this condition may depend on what caused the condition. Treatment options include: °· Taking vitamin D supplements. °· Taking a calcium supplement. Your health care provider will suggest what dose is best for you. ° °Follow these instructions at home: °· Take medicines and supplements only as told by your health care provider. °· Eat foods that contain vitamin D. Choices include: °? Fortified dairy products, cereals, or juices. Fortified means that vitamin D has been added to the food. Check the label on the package to be sure. °? Fatty fish, such as salmon or trout. °? Eggs. °? Oysters. °· Do not use a tanning bed. °· Maintain a healthy weight. Lose weight, if needed. °· Keep all follow-up visits as told by your health care provider. This is important. °Contact a health care provider if: °· Your symptoms do not go away. °· You feel like throwing up (nausea) or you throw up (vomit). °· You have fewer bowel movements than usual or it is difficult for you to have a   bowel movement (constipation). °This information is not intended to replace advice given to you by your health care provider. Make sure you discuss any questions you have with your health care provider. °Document Released: 04/15/2011 Document Revised: 07/05/2015 Document Reviewed: 06/08/2014 °Elsevier Interactive Patient Education © 2018 [employer] ° °

## 2016-11-22 LAB — CYTOLOGY - PAP: HPV (WINDOPATH): DETECTED — AB

## 2016-11-27 ENCOUNTER — Telehealth: Payer: Self-pay | Admitting: *Deleted

## 2016-11-27 NOTE — Telephone Encounter (Signed)
Notes recorded by Burnice Logan, RN on 11/27/2016 at 11:09 AM EDT Left message to call Elizabeth Contreras at [phone]. See telephone encounter dated 11/27/16. ------  Notes recorded by Salvadore Dom, MD on 11/25/2016 at 5:26 PM EDT Please inform and set her up for a colposcopy. She has a h/o a leep and then an abnormal pap last year, so she knows what a colposcopy is.

## 2016-12-03 NOTE — Telephone Encounter (Signed)
Spoke with patient, advised as seen below per Dr. Talbert Nan. Advised of pap LGSIL, positive HPV. No current contraceptive, LMP 11/02/16. Patient aware to return call to office on first day of menses to schedule colposcopy. Patient verbalizes understanding and is agreeable.

## 2016-12-04 ENCOUNTER — Encounter: Payer: Self-pay | Admitting: Family Medicine

## 2016-12-16 NOTE — Telephone Encounter (Signed)
Left message to call Madalin Hughart at [phone].  

## 2016-12-18 ENCOUNTER — Encounter: Payer: Self-pay | Admitting: Adult Health

## 2016-12-19 NOTE — Telephone Encounter (Signed)
Spoke with patient. Reports LMP 12/07/16. Currently SA, no contraception. Advised patient colposcopy to be scheduled before day 12 of cycle. Call with first day of menses to schedule colposcopy before day 12 of cycle.   Advised patient will review with Dr. Talbert Nan and return call with any additional recommendations. Patient is agreeable and verbalizes understanding.   Reviewed with Dr. Talbert Nan -agreeable to plan, call with start of menses.   1 month recall placed 01/18/17 for colpo.  Dr. Talbert Nan -ok to close encounter? Lab result open to f/u and 1 mo  recall placed.

## 2016-12-19 NOTE — Telephone Encounter (Signed)
Patient returning call.

## 2016-12-19 NOTE — Telephone Encounter (Signed)
Left message to call Akeia Perot at [phone].  

## 2016-12-24 ENCOUNTER — Ambulatory Visit (INDEPENDENT_AMBULATORY_CARE_PROVIDER_SITE_OTHER): Payer: PRIVATE HEALTH INSURANCE | Admitting: Family Medicine

## 2016-12-24 ENCOUNTER — Encounter: Payer: Self-pay | Admitting: Family Medicine

## 2016-12-24 VITALS — BP 100/70 | HR 63 | Temp 98.7°F | Resp 12 | Ht 66.0 in | Wt 142.5 lb

## 2016-12-24 DIAGNOSIS — J069 Acute upper respiratory infection, unspecified: Secondary | ICD-10-CM | POA: Diagnosis not present

## 2016-12-24 DIAGNOSIS — R059 Cough, unspecified: Secondary | ICD-10-CM

## 2016-12-24 DIAGNOSIS — J4521 Mild intermittent asthma with (acute) exacerbation: Secondary | ICD-10-CM | POA: Diagnosis not present

## 2016-12-24 DIAGNOSIS — R05 Cough: Secondary | ICD-10-CM

## 2016-12-24 MED ORDER — ALBUTEROL SULFATE HFA 108 (90 BASE) MCG/ACT IN AERS: 2.0000 | INHALATION_SPRAY | Freq: Four times a day (QID) | RESPIRATORY_TRACT | 1 refills | Status: DC | PRN

## 2016-12-24 MED ORDER — PREDNISONE 20 MG PO TABS: 40.0000 mg | ORAL_TABLET | Freq: Every day | ORAL | 0 refills | Status: AC

## 2016-12-24 MED ORDER — BENZONATATE 100 MG PO CAPS: 200.0000 mg | ORAL_CAPSULE | Freq: Two times a day (BID) | ORAL | 0 refills | Status: AC | PRN

## 2016-12-24 NOTE — Progress Notes (Signed)
ACUTE VISIT  HPI:  Chief Complaint  Patient presents with  . Cough    [name] is a 36 y.o.female here today complaining of 11 days of cough.  Initially she had some sore throat but this has improved.  Cough  This is a new problem. Associated symptoms include postnasal drip, rhinorrhea, a sore throat and wheezing. Pertinent negatives include no chills, ear congestion, ear pain, eye redness, fever, headaches, heartburn, myalgias, rash or shortness of breath. The symptoms are aggravated by exercise. Her past medical history is significant for environmental allergies.    Wheezing started about 2 weeks before URI symptoms. Exacerbated with exercise.   No Hx of recent travel. No sick contact. No known insect bite.  Hx of allergies: Hx of asthma,Albuterol inh last used 10/2015.  OTC medications for this problem: Zinc powder and vit D.   Symptoms otherwise stable.   Review of Systems  Constitutional: Positive for fatigue. Negative for activity change, appetite change, chills and fever.  HENT: Positive for congestion, postnasal drip, rhinorrhea and sore throat. Negative for ear pain, mouth sores, sinus pressure, trouble swallowing and voice change.   Eyes: Negative for discharge, redness and itching.  Respiratory: Positive for cough and wheezing. Negative for chest tightness and shortness of breath.   Gastrointestinal: Negative for abdominal pain, diarrhea, heartburn, nausea and vomiting.  Musculoskeletal: Negative for myalgias and neck pain.  Skin: Negative for pallor and rash.  Allergic/Immunologic: Positive for environmental allergies.  Neurological: Negative for weakness and headaches.  Hematological: Negative for adenopathy. Does not bruise/bleed easily.      Current Outpatient Medications on File Prior to Visit  Medication Sig Dispense Refill  . 5-Hydroxytryptophan (5-HTP) 100 MG CAPS Take by mouth.    Marland Kitchen acetaminophen (TYLENOL) 325 MG tablet Take  650 mg by mouth every 6 (six) hours as needed.    . ALPRAZolam (XANAX) 0.25 MG tablet Take 1 tablet (0.25 mg total) by mouth 2 (two) times daily as needed for anxiety. 20 tablet 0  . ALPRAZolam (XANAX) 0.25 MG tablet alprazolam 0.25 mg tablet    . B Complex Vitamins (VITAMIN-B COMPLEX) TABS     . Cholecalciferol (VITAMIN D) 2000 units CAPS Take by mouth.    . fexofenadine (ALLEGRA) 30 MG tablet Take 30 mg by mouth 2 (two) times daily.    . fluticasone (FLONASE) 50 MCG/ACT nasal spray Place 2 sprays into both nostrils daily. 15.8 g 11  . ibuprofen (ADVIL,MOTRIN) 200 MG tablet Take 200 mg by mouth every 6 (six) hours as needed.    . Magnesium 400 MG TABS Take 1 tablet by mouth daily.    . Misc Natural Products (ADRENAL) CAPS     . Probiotic Product (PROBIOTIC-10 PO) Take by mouth daily.    Marland Kitchen UNABLE TO FIND Take 1 Dose by mouth daily. Med Name: CBD oil 20MG  nightly    . valACYclovir (VALTREX) 500 MG tablet Take 1 tablet (500 mg total) by mouth daily. 30 tablet 4  . Vitamin D, Ergocalciferol, (DRISDOL) 50000 units CAPS capsule      No current facility-administered medications on file prior to visit.      Past Medical History:  Diagnosis Date  . Asthma   . Common migraine with intractable migraine 06/20/2016  . Herpes   . Migraine without aura   . PCOS (polycystic ovarian syndrome)   . PTSD (post-traumatic stress disorder) 2015  . Sinusitis   . 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 Thyroid disease    Allergies  Allergen Reactions  . Topamax [Topiramate]     Cognitive slowing    Social History   Socioeconomic History  . Marital status: Married    Spouse name: None  . Number of children: 0  . Years of education: 68  . Highest education level: None  Social Needs  . Financial resource strain: None  . Food insecurity - worry: None  . Food insecurity - inability: None  . Transportation needs - medical: None  . Transportation needs - non-medical: None    Occupational History    Comment: unemployed  Tobacco Use  . Smoking status: Never Smoker  . Smokeless tobacco: Never Used  Substance and Sexual Activity  . Alcohol use: Yes    Comment: once weekly  . Drug use: No  . Sexual activity: Yes    Partners: Male    Birth control/protection: None  Other Topics Concern  . None  Social History Narrative   Lives with fiancee   Caffeine -tea 1-2 daily    Vitals:   12/24/16 1108  BP: 100/70  Pulse: 63  Resp: 12  Temp: 98.7 F (37.1 C)  SpO2: 98%   Body mass index is 23 kg/m.   Physical Exam  Nursing note and vitals reviewed. Constitutional: She is oriented to person, place, and time. She appears well-developed and well-nourished. She does not appear ill. No distress.  HENT:  Head: Normocephalic and atraumatic.  Right Ear: Tympanic membrane, external ear and ear canal normal.  Left Ear: Tympanic membrane, external ear and ear canal normal.  Nose: Rhinorrhea present. Right sinus exhibits no maxillary sinus tenderness and no frontal sinus tenderness. Left sinus exhibits no maxillary sinus tenderness and no frontal sinus tenderness.  Mouth/Throat: Oropharynx is clear and moist and mucous membranes are normal.  Post nasal drainage.  Eyes: Conjunctivae are normal.  Cardiovascular: Normal rate and regular rhythm.  No murmur heard. Respiratory: Effort normal and breath sounds normal. No respiratory distress.  Prolonged expiration.  Lymphadenopathy:       Head (right side): No submandibular adenopathy present.       Head (left side): No submandibular adenopathy present.    She has no cervical adenopathy.  Neurological: She is alert and oriented to person, place, and time. She has normal strength.  Skin: Skin is warm. No rash noted. No erythema.  Psychiatric: She has a normal mood and affect. Her speech is normal.  Well groomed, good eye contact.     ASSESSMENT AND PLAN:   Ms. Elizabeth Contreras was seen today for cough.  Diagnoses and  all orders for this visit:  Mild intermittent asthma with acute exacerbation  Today auscultation otherwise negative. Albuterol inh 2 puff every 6 hours for a week then as needed for wheezing or shortness of breath.  She does not want to take Prednisone now. Rx sent, so she can start in 2-3 days if not any better. Side effects discussed.  -     predniSONE (DELTASONE) 20 MG tablet; Take 2 tablets (40 mg total) by mouth daily with breakfast for 5 days. -     albuterol (PROVENTIL HFA;VENTOLIN HFA) 108 (90 Base) MCG/ACT inhaler; Inhale 2 puffs into the lungs every 6 (six) hours as needed for wheezing or shortness of breath.  Viral upper respiratory tract infection  Resolved and now having residual symptoms. Instructed to monitor for signs of complications, including new onset of fever among some, clearly instructed about  warning signs.  F/U as needed.  Cough  Explained that cough and nasal congestion can last a few days and sometimes weeks. Further recommendations will be given according to CXR report.  -     benzonatate (TESSALON) 100 MG capsule; Take 2 capsules (200 mg total) by mouth 2 (two) times daily as needed for up to 10 days for cough. -     DG Chest 2 View; Future    -Ms. Elizabeth Contreras was advised to seek attention immediately if symptoms worsen or to follow if they persist or new concerns arise.       Arlynn Mcdermid G. [place], MD  [employer] [place] office.

## 2016-12-24 NOTE — Patient Instructions (Addendum)
  [name] I have seen you today for an acute visit.  A few things to remember from today's visit:   URI, acute  Mild intermittent asthma with acute exacerbation - Plan: predniSONE (DELTASONE) 20 MG tablet, albuterol (PROVENTIL HFA;VENTOLIN HFA) 108 (90 Base) MCG/ACT inhaler  Cough - Plan: benzonatate (TESSALON) 100 MG capsule, DG Chest 2 View   Medications prescribed today are intended for short period of time and will not be refill upon request, a follow up appointment might be necessary to discuss continuation of of treatment if appropriate.     In general please monitor for signs of worsening symptoms and seek immediate medical attention if any concerning.  If symptoms are not resolved in 1-2 weeks you should schedule a follow up appointment with your doctor, before if needed.  I hope you get better soon!

## 2016-12-25 ENCOUNTER — Ambulatory Visit: Payer: PRIVATE HEALTH INSURANCE | Admitting: Adult Health

## 2017-01-15 ENCOUNTER — Telehealth: Payer: Self-pay | Admitting: Obstetrics and Gynecology

## 2017-01-15 DIAGNOSIS — R87612 Low grade squamous intraepithelial lesion on cytologic smear of cervix (LGSIL): Secondary | ICD-10-CM

## 2017-01-15 DIAGNOSIS — B977 Papillomavirus as the cause of diseases classified elsewhere: Secondary | ICD-10-CM

## 2017-01-15 NOTE — Telephone Encounter (Signed)
Spoke with patient, colpo scheduled for 01/20/17 at 1:30pm with Dr. Talbert Nan. Advised to take Motrin 800 mg with food and water one hour before procedure. Patient verbalizes understanding and is agreeable.   Routing to provider for final review. Patient is agreeable to disposition. Will close encounter.   Cc: Lerry Liner

## 2017-01-15 NOTE — Telephone Encounter (Signed)
Spoke with patient. LMP 01/12/17, calling to schedule colpo. Patient will be leaving out of town on 12/18. Advised patient will review scheduling with nursing supervisor and return call, patient agreeable.

## 2017-01-15 NOTE — Telephone Encounter (Signed)
Patient called to report she started her menstrual cycle on 01/12/17 and she is ready to schedule her colposcopy.  Routing to triage. Cc: Deloris Ping

## 2017-01-16 ENCOUNTER — Telehealth: Payer: Self-pay | Admitting: Obstetrics and Gynecology

## 2017-01-16 ENCOUNTER — Ambulatory Visit: Payer: PRIVATE HEALTH INSURANCE | Admitting: Adult Health

## 2017-01-16 NOTE — Telephone Encounter (Signed)
Patient returned call. Reviewed benefit for scheduled colposcopy on 01/20/17 (not ultrasound as listed in previous message in error). Patient understood information presented, but is requesting to defer scheduling until after February 03, 2017.  Advised I will review with her provider.  Routing to Dr Talbert Nan  cc: Lamont Snowball, RN

## 2017-01-16 NOTE — Telephone Encounter (Signed)
I think she can defer for a short time, but would recommend she try and get this done in the next 6 months.

## 2017-01-16 NOTE — Telephone Encounter (Signed)
Call placed to patient to review benefits for a scheduled ultrasound. Left voicemail message requesting a return call.

## 2017-01-20 ENCOUNTER — Ambulatory Visit: Payer: Self-pay | Admitting: Obstetrics and Gynecology

## 2017-01-20 NOTE — Telephone Encounter (Signed)
Spoke with patient and conveyed the information, as noted in previous message from Dr Talbert Nan. Patient states she will call in January 2019 at the onset of her next cycle for scheduling.   cc: Dr Talbert Nan

## 2017-02-05 ENCOUNTER — Other Ambulatory Visit (INDEPENDENT_AMBULATORY_CARE_PROVIDER_SITE_OTHER): Payer: PRIVATE HEALTH INSURANCE

## 2017-02-05 DIAGNOSIS — E038 Other specified hypothyroidism: Secondary | ICD-10-CM

## 2017-02-05 DIAGNOSIS — E039 Hypothyroidism, unspecified: Secondary | ICD-10-CM

## 2017-02-06 LAB — T4, FREE: FREE T4: 0.72 ng/dL (ref 0.60–1.60)

## 2017-02-06 LAB — TSH: TSH: 3.15 u[IU]/mL (ref 0.35–4.50)

## 2017-02-10 ENCOUNTER — Telehealth: Payer: Self-pay | Admitting: Family Medicine

## 2017-02-10 NOTE — Telephone Encounter (Signed)
Phone call from pt. for lab results.  Results given per Dr. Volanda Napoleon review.  (No result note was forwarded to the [facility] Triage Pool.)

## 2017-02-24 ENCOUNTER — Telehealth: Payer: Self-pay | Admitting: Obstetrics and Gynecology

## 2017-02-24 NOTE — Telephone Encounter (Signed)
Patient wants to schedule colposcopy

## 2017-02-24 NOTE — Telephone Encounter (Signed)
Spoke with patient, calling to schedule colpo.   LMP 02/20/17 No current contraceptive. SA. Patient states spouse "unable to have children".   Patient states she is currently out of town, will return on 1/28. Advised patient colpo typically scheduled before day 12 of cycle, 1/28 would be day 12.   Colpo scheduled for 1/29 at 1:30pm with Dr. Talbert Nan. Advised patient to abstain until after colpo. Advised to take Motrin 800 mg with food and water one hour before procedure.  Advised will review with Dr. Talbert Nan, will return call with any additional recommendations. Patient verbalizes understanding and is agreeable.   Dr. Nelson Chimes, ok to proceed with colpo as scheduled?   Cc: Lerry Liner

## 2017-02-24 NOTE — Telephone Encounter (Signed)
Yes it is okay to proceed with the colposcopy

## 2017-02-24 NOTE — Telephone Encounter (Signed)
Will close encounter

## 2017-03-04 ENCOUNTER — Other Ambulatory Visit: Payer: Self-pay

## 2017-03-04 ENCOUNTER — Encounter: Payer: Self-pay | Admitting: Obstetrics and Gynecology

## 2017-03-04 ENCOUNTER — Ambulatory Visit (INDEPENDENT_AMBULATORY_CARE_PROVIDER_SITE_OTHER): Payer: PRIVATE HEALTH INSURANCE | Admitting: Obstetrics and Gynecology

## 2017-03-04 VITALS — BP 110/64 | HR 84 | Resp 14 | Wt 143.0 lb

## 2017-03-04 DIAGNOSIS — R87612 Low grade squamous intraepithelial lesion on cytologic smear of cervix (LGSIL): Secondary | ICD-10-CM | POA: Diagnosis not present

## 2017-03-04 DIAGNOSIS — B977 Papillomavirus as the cause of diseases classified elsewhere: Secondary | ICD-10-CM

## 2017-03-04 DIAGNOSIS — Z01812 Encounter for preprocedural laboratory examination: Secondary | ICD-10-CM | POA: Diagnosis not present

## 2017-03-04 LAB — POCT URINE PREGNANCY: PREG TEST UR: NEGATIVE

## 2017-03-04 NOTE — Progress Notes (Signed)
GYNECOLOGY  VISIT   HPI: 37 y.o.   Married  Caucasian  female   G0P0000 with Patient's last menstrual period was 02/20/2017.   here for a colposcopy. Recent pap with LSIL, +HPV. She has a h/o of a LEEP in 4/15, had an ASCUS/+HPV pap in 9/17 and [place] another [place] ([place]). Per patient the colpo was "okay". She is here with her husband.  GYNECOLOGIC HISTORY: Patient's last menstrual period was 02/20/2017. Contraception:none Menopausal hormone therapy: none        OB History    Gravida Para Term Preterm AB Living   0 0 0 0 0 0   SAB TAB Ectopic Multiple Live Births   0 0 0 0 0         Patient Active Problem List   Diagnosis Date Noted  . Subclinical hypothyroidism 11/21/2016  . Vitamin D deficiency 11/21/2016  . Common migraine with intractable migraine 06/20/2016    Past Medical History:  Diagnosis Date  . Asthma   . Common migraine with intractable migraine 06/20/2016  . Herpes   . Migraine without aura   . PCOS (polycystic ovarian syndrome)   . PTSD (post-traumatic stress disorder) 2015  . Sinusitis   . 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 Thyroid disease     Past Surgical History:  Procedure Laterality Date  . COLPOSCOPY    . NASAL SINUS SURGERY  07/2015    Current Outpatient Medications  Medication Sig Dispense Refill  . 5-Hydroxytryptophan (5-HTP) 100 MG CAPS Take by mouth.    Marland Kitchen acetaminophen (TYLENOL) 325 MG tablet Take 650 mg by mouth every 6 (six) hours as needed.    Marland Kitchen albuterol (PROVENTIL HFA;VENTOLIN HFA) 108 (90 Base) MCG/ACT inhaler Inhale 2 puffs into the lungs every 6 (six) hours as needed for wheezing or shortness of breath. 1 Inhaler 1  . ALPRAZolam (XANAX) 0.25 MG tablet Take 1 tablet (0.25 mg total) by mouth 2 (two) times daily as needed for anxiety. 20 tablet 0  . B Complex Vitamins (VITAMIN-B COMPLEX) TABS     . Cholecalciferol (VITAMIN D) 2000 units CAPS Take by mouth.    . fexofenadine (ALLEGRA) 30 MG tablet  Take 30 mg by mouth 2 (two) times daily.    Marland Kitchen ibuprofen (ADVIL,MOTRIN) 200 MG tablet Take 200 mg by mouth every 6 (six) hours as needed.    . Magnesium 400 MG TABS Take 1 tablet by mouth daily.    . Probiotic Product (PROBIOTIC-10 PO) Take by mouth daily.    Marland Kitchen UNABLE TO FIND Take 1 Dose by mouth daily. Med Name: CBD oil 20MG  nightly    . valACYclovir (VALTREX) 500 MG tablet Take 1 tablet (500 mg total) by mouth daily. 30 tablet 4  . Vitamin D, Ergocalciferol, (DRISDOL) 50000 units CAPS capsule      No current facility-administered medications for this visit.      ALLERGIES: Topamax [topiramate]  Family History  Problem Relation Age of Onset  . Migraines Mother   . Aneurysm Mother   . Cancer Maternal Aunt        ovarian  . Cancer Paternal Uncle        colon    Social History   Socioeconomic History  . Marital status: Married    Spouse name: Not on file  . Number of children: 0  . Years of education: 65  . Highest education level: Not on file  Social Needs  .  Financial resource strain: Not on file  . Food insecurity - worry: Not on file  . Food insecurity - inability: Not on file  . Transportation needs - medical: Not on file  . Transportation needs - non-medical: Not on file  Occupational History    Comment: unemployed  Tobacco Use  . Smoking status: Never Smoker  . Smokeless tobacco: Never Used  Substance and Sexual Activity  . Alcohol use: Yes    Comment: once weekly  . Drug use: No  . Sexual activity: Yes    Partners: Male    Birth control/protection: None  Other Topics Concern  . Not on file  Social History Narrative   Lives with fiancee   Caffeine -tea 1-2 daily    Review of Systems  Constitutional: Negative.   HENT: Negative.   Eyes: Negative.   Respiratory: Negative.   Cardiovascular: Negative.   Gastrointestinal: Negative.   Genitourinary: Negative.   Musculoskeletal: Negative.   Skin: Negative.   Neurological: Negative.   Endo/Heme/Allergies:  Negative.   Psychiatric/Behavioral: Negative.     PHYSICAL EXAMINATION:    BP 110/64 (BP Location: Right Arm, Patient Position: Sitting, Cuff Size: Normal)   Pulse 84   Resp 14   Wt 143 lb (64.9 kg)   LMP 02/20/2017   BMI 23.08 kg/m     General appearance: alert, cooperative and appears stated age  Pelvic: External genitalia:  no lesions              Urethra:  normal appearing urethra with no masses, tenderness or lesions              Bartholins and Skenes: normal                 Vagina: normal appearing vagina with normal color and discharge, no lesions              Cervix: no gross lesions  Colposcopy: unsatisfactory, small area of aceto-white changes at 9 o'clock (just at the external os), biopsy done. Endocervical curettage performed. Biopsy site treated with silver nitrate. Negative lugols examination of the vagina.  Chaperone was present for exam.  ASSESSMENT LSIL, +HPV, prior LEEP    PLAN Colposcopy with biopsy and ECC done Further plans based on results   An After Visit Summary was printed and given to the patient.

## 2017-03-04 NOTE — Patient Instructions (Signed)

## 2017-03-07 ENCOUNTER — Telehealth: Payer: Self-pay

## 2017-03-07 DIAGNOSIS — D069 Carcinoma in situ of cervix, unspecified: Secondary | ICD-10-CM

## 2017-03-07 NOTE — Telephone Encounter (Signed)
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 Unfortunately she does need another leep (had one out of state a few years ago). Please set this up. I think this has been very stressful for her. She brought her husband to her colposcopy. See if she wants some ativan prior to the procedure (if she has a ride)

## 2017-03-07 NOTE — Telephone Encounter (Signed)
Spoke with patient. Advised of results as seen below. Patient verbalizes understanding. Does want rx for Ativan before her appointment. Aware she will need to have someone drive her to and from appointment. Will need to pick up rx from the office and sign consent before appointment. Patient is agreeable. LEEP scheduled for 03/14/2017 at 1 pm with Dr.Jertson.  Dr.Jertson please advise dosage of Ativan.

## 2017-03-07 NOTE — Telephone Encounter (Signed)
Ativan 1 mg, take one tablet one hour prior to the procedure.

## 2017-03-10 MED ORDER — LORAZEPAM 1 MG PO TABS: ORAL_TABLET | ORAL | 0 refills | Status: DC

## 2017-03-10 NOTE — Telephone Encounter (Signed)
Rx written and to [place] for signature.

## 2017-03-11 NOTE — Telephone Encounter (Signed)
Called placed to patient [facility] to reveiw benefits for procedure.

## 2017-03-12 NOTE — Telephone Encounter (Signed)
Patient in office, signed consent for LEEP procedure. Printed RX for Ativan given to patient. Patient is aware will need driver day of procedure.   Signed consent to E. Hanner, LPN  Routing to provider for final review. Patient is agreeable to disposition. Will close encounter.  Cc: E. Hanner, K. Sprague

## 2017-03-14 ENCOUNTER — Ambulatory Visit (INDEPENDENT_AMBULATORY_CARE_PROVIDER_SITE_OTHER): Payer: PRIVATE HEALTH INSURANCE | Admitting: Obstetrics and Gynecology

## 2017-03-14 ENCOUNTER — Encounter: Payer: Self-pay | Admitting: Obstetrics and Gynecology

## 2017-03-14 ENCOUNTER — Other Ambulatory Visit: Payer: Self-pay

## 2017-03-14 VITALS — BP 110/70 | HR 80 | Resp 12 | Wt 142.0 lb

## 2017-03-14 DIAGNOSIS — D069 Carcinoma in situ of cervix, unspecified: Secondary | ICD-10-CM | POA: Diagnosis not present

## 2017-03-14 DIAGNOSIS — Z01812 Encounter for preprocedural laboratory examination: Secondary | ICD-10-CM

## 2017-03-14 LAB — POCT URINE PREGNANCY: PREG TEST UR: NEGATIVE

## 2017-03-14 NOTE — Patient Instructions (Signed)

## 2017-03-14 NOTE — Progress Notes (Signed)
GYNECOLOGY  VISIT   HPI: 37 y.o.   Married  Caucasian  female   G0P0000 with Patient's last menstrual period was 02/20/2017.   here for a LEEP. Patient took Ativan 1mg  1 hour prior to today's procedure. 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 She is here with her husband. No plans for children.  GYNECOLOGIC HISTORY: Patient's last menstrual period was 02/20/2017. Contraception:none  Menopausal hormone therapy: none         OB History    Gravida Para Term Preterm AB Living   0 0 0 0 0 0   SAB TAB Ectopic Multiple Live Births   0 0 0 0 0         Patient Active Problem List   Diagnosis Date Noted  . Subclinical hypothyroidism 11/21/2016  . Vitamin D deficiency 11/21/2016  . Common migraine with intractable migraine 06/20/2016    Past Medical History:  Diagnosis Date  . Asthma   . Common migraine with intractable migraine 06/20/2016  . Herpes   . Migraine without aura   . PCOS (polycystic ovarian syndrome)   . PTSD (post-traumatic stress disorder) 2015  . Sinusitis   . 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 Thyroid disease     Past Surgical History:  Procedure Laterality Date  . COLPOSCOPY    . NASAL SINUS SURGERY  07/2015    Current Outpatient Medications  Medication Sig Dispense Refill  . 5-Hydroxytryptophan (5-HTP) 100 MG CAPS Take by mouth.    Marland Kitchen acetaminophen (TYLENOL) 325 MG tablet Take 650 mg by mouth every 6 (six) hours as needed.    Marland Kitchen albuterol (PROVENTIL HFA;VENTOLIN HFA) 108 (90 Base) MCG/ACT inhaler Inhale 2 puffs into the lungs every 6 (six) hours as needed for wheezing or shortness of breath. 1 Inhaler 1  . ALPRAZolam (XANAX) 0.25 MG tablet Take 1 tablet (0.25 mg total) by mouth 2 (two) times daily as needed for anxiety. 20 tablet 0  . B Complex Vitamins (VITAMIN-B COMPLEX) TABS     . Cholecalciferol (VITAMIN D) 2000 units CAPS Take by mouth.    . fexofenadine (ALLEGRA) 30 MG tablet Take 30 mg by mouth 2  (two) times daily.    Marland Kitchen ibuprofen (ADVIL,MOTRIN) 200 MG tablet Take 200 mg by mouth every 6 (six) hours as needed.    Marland Kitchen LORazepam (ATIVAN) 1 MG tablet Take 1 tablet one hour prior to procedure. 1 tablet 0  . Magnesium 400 MG TABS Take 1 tablet by mouth daily.    . Probiotic Product (PROBIOTIC-10 PO) Take by mouth daily.    Marland Kitchen UNABLE TO FIND Take 1 Dose by mouth daily. Med Name: CBD oil 20MG  nightly    . valACYclovir (VALTREX) 500 MG tablet Take 1 tablet (500 mg total) by mouth daily. 30 tablet 4  . Vitamin D, Ergocalciferol, (DRISDOL) 50000 units CAPS capsule      No current facility-administered medications for this visit.      ALLERGIES: Topamax [topiramate]  Family History  Problem Relation Age of Onset  . Migraines Mother   . Aneurysm Mother   . Cancer Maternal Aunt        ovarian  . Cancer Paternal Uncle        colon    Social History   Socioeconomic History  . Marital status: Married    Spouse name: Not on file  . Number of children: 0  . Years of education: 16  . Highest  education level: Not on file  Social Needs  . Financial resource strain: Not on file  . Food insecurity - worry: Not on file  . Food insecurity - inability: Not on file  . Transportation needs - medical: Not on file  . Transportation needs - non-medical: Not on file  Occupational History    Comment: unemployed  Tobacco Use  . Smoking status: Never Smoker  . Smokeless tobacco: Never Used  Substance and Sexual Activity  . Alcohol use: Yes    Comment: once weekly  . Drug use: No  . Sexual activity: Yes    Partners: Male    Birth control/protection: None  Other Topics Concern  . Not on file  Social History Narrative   Lives with fiancee   Caffeine -tea 1-2 daily    Review of Systems  Constitutional: Negative.   HENT: Negative.   Eyes: Negative.   Respiratory: Negative.   Cardiovascular: Negative.   Gastrointestinal: Negative.   Musculoskeletal: Negative.   Skin: Negative.    Neurological: Negative.   Endo/Heme/Allergies: Negative.   Psychiatric/Behavioral: Negative.     PHYSICAL EXAMINATION:    BP 110/70 (BP Location: Right Arm, Patient Position: Sitting, Cuff Size: Normal)   Pulse 80   Resp 12   Wt 142 lb (64.4 kg)   LMP 02/20/2017   BMI 22.92 kg/m     General appearance: alert, cooperative and appears stated age  Pelvic: External genitalia:  no lesions              Urethra:  normal appearing urethra with no masses, tenderness or lesions              Bartholins and Skenes: normal                 Vagina: normal appearing vagina with normal color and discharge, no lesions              Cervix: no gross lesions  Procedure: The patient was counseled as to the risks of the procedure, including: infection, bleeding, future pregnancy risks and cervical stenosis. A consent form was signed.  Under colposcopic guidance, Lugols solution was placed on the cervix and a paracervical block was injected using 1% lidocaine with epinephrine. Under colposcopic guidance, the 2 x 0.8 cm loop was used to remove a portion of the ectocervix taking care to get the entire transformation zone.  A second 1 x 1 cm loop was used to remove a portion of the endocervix. The settings were 55 cut, 25 coag with a blend of 1.  An ECC was performed. The cautery ball was then used to cauterize the base of the biopsy site and monsels were placed. The patient tolerated the procedure well.   Chaperone was present for exam.  ASSESSMENT CIN III, unsatisfactory colposcopy    PLAN Loop cone cervical biopsy with ECC F/U in one month   An After Visit Summary was printed and given to the patient.

## 2017-03-14 NOTE — Addendum Note (Signed)
Addended by: Dorothy Spark on: 03/14/2017 01:37 PM   Modules accepted: Orders

## 2017-04-14 ENCOUNTER — Ambulatory Visit: Payer: PRIVATE HEALTH INSURANCE | Admitting: Obstetrics and Gynecology

## 2017-04-14 ENCOUNTER — Other Ambulatory Visit: Payer: Self-pay

## 2017-04-14 ENCOUNTER — Encounter: Payer: Self-pay | Admitting: Obstetrics and Gynecology

## 2017-04-14 VITALS — BP 90/54 | HR 66 | Resp 14 | Wt 143.0 lb

## 2017-04-14 DIAGNOSIS — Z9889 Other specified postprocedural states: Secondary | ICD-10-CM

## 2017-04-14 NOTE — Progress Notes (Signed)
GYNECOLOGY  VISIT   HPI: 37 y.o.   Married  Caucasian  female   [id number] with Patient's last menstrual period was 04/01/2017.   here for f/u leep.  She is doing well, had a normal cycle at the end of 2/19. Slight spotting the other day.   Diagnosis 1. 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 - ENDOCERVICAL GLANDS INVOLVED. - NO INVASIVE PROCESS IDENTIFIED. - DYSPLASIA EXTENDS TO INKED RESECTION MARGIN. 2. 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 - NO INVASIVE PROCESS IDENTIFIED. - SURGICAL RESECTION MARGIN APPEARS FREE. 3. Endocervix, curettage - SMALL FRAGMENTS OF BENIGN ENDOCERVICAL AND ENDOMETRIAL GLANDULAR TISSUE. - SMALL FRAGMENTS OF BENIGN SQUAMOUS EPITHELIUM. - NO ATYPIA, DYSPLASIA, HYPERPLASIA, OR MALIGNANCY IDENTIFIED. Mark Martinique MD Pathologist, Electronic Signature (Case signed 03/17/2017)  I spoke with the pathologist, the only + margin was the deep margin of the exocervix. CIN II-III on exo and endocervix loop specimens, negative margins, negative ECC    GYNECOLOGIC HISTORY: Patient's last menstrual period was 04/01/2017. Contraception:none Menopausal hormone therapy: none        OB History    Gravida Para Term Preterm AB Living   0 0 0 0 0 0   SAB TAB Ectopic Multiple Live Births   0 0 0 0 0         Patient Active Problem List   Diagnosis Date Noted  . Subclinical hypothyroidism 11/21/2016  . Vitamin D deficiency 11/21/2016  . Common migraine with intractable migraine 06/20/2016    Past Medical History:  Diagnosis Date  . Asthma   . Common migraine with intractable migraine 06/20/2016  . Herpes   . Migraine without aura   . PCOS (polycystic ovarian  syndrome)   . PTSD (post-traumatic stress disorder) 2015  . Sinusitis   . 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 Thyroid disease     Past Surgical History:  Procedure Laterality Date  . COLPOSCOPY    . NASAL SINUS SURGERY  07/2015    Current Outpatient Medications  Medication Sig Dispense Refill  . 5-Hydroxytryptophan (5-HTP) 100 MG CAPS Take by mouth.    Marland Kitchen acetaminophen (TYLENOL) 325 MG tablet Take 650 mg by mouth every 6 (six) hours as needed.    Marland Kitchen albuterol (PROVENTIL HFA;VENTOLIN HFA) 108 (90 Base) MCG/ACT inhaler Inhale 2 puffs into the lungs every 6 (six) hours as needed for wheezing or shortness of breath. 1 Inhaler 1  . ALPRAZolam (XANAX) 0.25 MG tablet Take 1 tablet (0.25 mg total) by mouth 2 (two) times daily as needed for anxiety. 20 tablet 0  . B Complex Vitamins (VITAMIN-B COMPLEX) TABS     . Cholecalciferol (VITAMIN D) 2000 units CAPS Take by mouth.    . fexofenadine (ALLEGRA) 30 MG tablet Take 30 mg by mouth 2 (two) times daily.    Marland Kitchen ibuprofen (ADVIL,MOTRIN) 200 MG tablet Take 200 mg by mouth every 6 (six) hours as needed.    Marland Kitchen LORazepam (ATIVAN) 1 MG tablet Take 1 tablet one hour prior to procedure. 1 tablet 0  . Magnesium 400 MG TABS Take 1 tablet by mouth daily.    . Probiotic Product (PROBIOTIC-10 PO) Take by mouth daily.    Marland Kitchen UNABLE TO FIND Take 1 Dose by mouth daily.  Med Name: CBD oil 20MG  nightly    . valACYclovir (VALTREX) 500 MG tablet Take 1 tablet (500 mg total) by mouth daily. 30 tablet 4  . Vitamin D, Ergocalciferol, (DRISDOL) 50000 units CAPS capsule      No current facility-administered medications for this visit.      ALLERGIES: Topamax [topiramate]  Family History  Problem Relation Age of Onset  . Migraines Mother   . Aneurysm Mother   . Cancer Maternal Aunt        ovarian  . Cancer Paternal Uncle        colon    Social History   Socioeconomic History  . Marital status: Married    Spouse name: Not on file  . Number of  children: 0  . Years of education: 59  . Highest education level: Not on file  Social Needs  . Financial resource strain: Not on file  . Food insecurity - worry: Not on file  . Food insecurity - inability: Not on file  . Transportation needs - medical: Not on file  . Transportation needs - non-medical: Not on file  Occupational History    Comment: unemployed  Tobacco Use  . Smoking status: Never Smoker  . Smokeless tobacco: Never Used  Substance and Sexual Activity  . Alcohol use: Yes    Comment: once weekly  . Drug use: No  . Sexual activity: Yes    Partners: Male    Birth control/protection: None  Other Topics Concern  . Not on file  Social History Narrative   Lives with fiancee   Caffeine -tea 1-2 daily    Review of Systems  Constitutional: Negative.   HENT: Negative.   Eyes: Negative.   Respiratory: Negative.   Cardiovascular: Negative.   Gastrointestinal: Negative.   Genitourinary: Negative.   Musculoskeletal: Negative.   Skin: Negative.   Neurological: Negative.   Endo/Heme/Allergies: Negative.   Psychiatric/Behavioral: Negative.     PHYSICAL EXAMINATION:    BP (!) 90/54 (BP Location: Right Arm, Patient Position: Sitting, Cuff Size: Normal)   Pulse 66   Resp 14   Wt 143 lb (64.9 kg)   LMP 04/01/2017   BMI 23.08 kg/m     General appearance: alert, cooperative and appears stated age  Pelvic: External genitalia:  no lesions              Urethra:  normal appearing urethra with no masses, tenderness or lesions              Bartholins and Skenes: normal                 Vagina: normal appearing vagina with normal color and discharge, no lesions              Cervix: no lesions and healing very well, slight granulation tissue posteriorly, treated with silver nitrate              Bimanual Exam:  Uterus:  normal size, contour, position, consistency, mobility, non-tender              Adnexa: no mass, fullness, tenderness                Chaperone was present  for exam.  ASSESSMENT H/O CIN III, s/p loop cone, negative margins Friable cervix posteriorly, treated with silver nitrate.     PLAN F/U for annual exam and pap in the fall Call with any concerns   An After Visit Summary was printed and given to  the patient.

## 2017-05-05 ENCOUNTER — Encounter: Payer: Self-pay | Admitting: Family Medicine

## 2017-05-05 ENCOUNTER — Ambulatory Visit: Payer: PRIVATE HEALTH INSURANCE | Admitting: Family Medicine

## 2017-05-05 VITALS — BP 118/60 | HR 64 | Temp 97.5°F | Wt 139.0 lb

## 2017-05-05 DIAGNOSIS — R05 Cough: Secondary | ICD-10-CM

## 2017-05-05 DIAGNOSIS — R0982 Postnasal drip: Secondary | ICD-10-CM | POA: Diagnosis not present

## 2017-05-05 DIAGNOSIS — J302 Other seasonal allergic rhinitis: Secondary | ICD-10-CM | POA: Diagnosis not present

## 2017-05-05 DIAGNOSIS — R059 Cough, unspecified: Secondary | ICD-10-CM

## 2017-05-05 MED ORDER — FLUTICASONE PROPIONATE 50 MCG/ACT NA SUSP: 1.0000 | Freq: Every day | NASAL | 3 refills | Status: DC

## 2017-05-05 MED ORDER — CETIRIZINE HCL 10 MG PO TABS: 10.0000 mg | ORAL_TABLET | Freq: Every day | ORAL | 11 refills | Status: DC

## 2017-05-05 MED ORDER — PREDNISONE 20 MG PO TABS: 40.0000 mg | ORAL_TABLET | Freq: Every day | ORAL | 0 refills | Status: AC

## 2017-05-05 NOTE — Progress Notes (Signed)
Subjective:    Patient ID: Elizabeth Contreras, female    DOB: 1980/08/13, 37 y.o.   MRN: [id number]  Chief Complaint  Patient presents with  . Cough    HPI Patient was seen today for acute concern.  Pt endorses cough which has become productive times 8 days.  Pt also notices ear pressure with blowing nose/sneezing.  Pt has been using her albuterol inhaler as needed as her cough is often worse at night with wheezing.  Pt denies fever, sore throat, headache, facial pain or pressure.  Pt denies sick contacts.  Pt has tried DayQuil and Sudafed for her symptoms with little relief.  Past Medical History:  Diagnosis Date  . Asthma   . Common migraine with intractable migraine 06/20/2016  . Herpes   . Migraine without aura   . PCOS (polycystic ovarian syndrome)   . PTSD (post-traumatic stress disorder) 2015  . Sinusitis   . 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 Thyroid disease     Allergies  Allergen Reactions  . Topamax [Topiramate]     Cognitive slowing    ROS General: Denies fever, chills, night sweats, changes in weight, changes in appetite HEENT: Denies headaches, ear pain, changes in vision, rhinorrhea, sore throat CV: Denies CP, palpitations, SOB, orthopnea Pulm: Denies SOB   + at times productive cough and wheezing at night GI: Denies abdominal pain, nausea, vomiting, diarrhea, constipation GU: Denies dysuria, hematuria, frequency, vaginal discharge Msk: Denies muscle cramps, joint pains Neuro: Denies weakness, numbness, tingling Skin: Denies rashes, bruising Psych: Denies depression, anxiety, hallucinations     Objective:    Blood pressure 118/60, pulse 64, temperature (!) 97.5 F (36.4 C), temperature source Oral, weight 139 lb (63 kg), SpO2 97 %.   Gen. Pleasant, well-nourished, in no distress, normal affect   HEENT: [place]/AT, face symmetric, no scleral icterus, PERRLA, nares patent without drainage, pharynx without erythema or exudate.  Postnasal  drainage noted.  TMs full bilaterally.  No cervical lymphadenopathy Lungs: no accessory muscle use, faint scattered wheezes throughout Cardiovascular: RRR, no m/r/g, no peripheral edema Neuro:  A&Ox3, CN II-XII intact, normal gait   Wt Readings from Last 3 Encounters:  05/05/17 139 lb (63 kg)  04/14/17 143 lb (64.9 kg)  03/14/17 142 lb (64.4 kg)    Lab Results  Component Value Date   WBC 8.2 11/06/2016   HGB 13.3 11/06/2016   HCT 40.7 11/06/2016   PLT 243.0 11/06/2016   GLUCOSE 91 11/06/2016   NA 137 11/06/2016   K 3.9 11/06/2016   CL 102 11/06/2016   CREATININE 0.72 11/06/2016   BUN 11 11/06/2016   CO2 27 11/06/2016   TSH 3.15 02/05/2017    Assessment/Plan:  Post-nasal drainage -Given handout - Plan: fluticasone (FLONASE) 50 MCG/ACT nasal spray, cetirizine (ZYRTEC) 10 MG tablet  Cough  -Given history of asthma will give Rx for prednisone burst.  Pt wishes to wait to pick up rx if wheezing continues - Plan: predniSONE (DELTASONE) 20 MG tablet  Seasonal allergies  - Plan: cetirizine (ZYRTEC) 10 MG tablet  Follow-up PRN  Grier Mitts, MD

## 2017-05-05 NOTE — Patient Instructions (Signed)
Postnasal Drip Postnasal drip is the feeling of mucus going down the back of your throat. Mucus is a slimy substance that moistens and cleans your nose and throat, as well as the air pockets in face bones near your forehead and cheeks (sinuses). Small amounts of mucus pass from your nose and sinuses down the back of your throat all the time. This is normal. When you produce too much mucus or the mucus gets too thick, you can feel it. Some common causes of postnasal drip include:  Having more mucus because of: ? A cold or the flu. ? Allergies. ? Cold air. ? Certain medicines.  Having more mucus that is thicker because of: ? A sinus or nasal infection. ? Dry air. ? A food allergy.  Follow these instructions at home: Relieving discomfort  Gargle with a salt-water mixture 3-4 times a day or as needed. To make a salt-water mixture, completely dissolve -1 tsp of salt in 1 cup of warm water.  If the air in your home is dry, use a humidifier to add moisture to the air.  Use a saline spray or container (neti pot) to flush out the nose (nasal irrigation). These methods can help clear away mucus and keep the nasal passages moist. General instructions  Take over-the-counter and prescription medicines only as told by your health care provider.  Follow instructions from your health care provider about eating or drinking restrictions. You may need to avoid caffeine.  Avoid things that you know you are allergic to (allergens), like dust, mold, pollen, pets, or certain foods.  Drink enough fluid to keep your urine pale yellow.  Keep all follow-up visits as told by your health care provider. This is important. Contact a health care provider if:  You have a fever.  You have a sore throat.  You have difficulty swallowing.  You have headache.  You have sinus pain.  You have a cough that does not go away.  The mucus from your nose becomes thick and is green or yellow in color.  You have  cold or flu symptoms that last more than 10 days. Summary  Postnasal drip is the feeling of mucus going down the back of your throat.  If your health care provider approves, use nasal irrigation or a nasal spray 2?4 times a day.  Avoid things that you know you are allergic to (allergens), like dust, mold, pollen, pets, or certain foods. This information is not intended to replace advice given to you by your health care provider. Make sure you discuss any questions you have with your health care provider. Document Released: 05/06/2016 Document Revised: 05/06/2016 Document Reviewed: 05/06/2016 Elsevier Interactive Patient Education  2018 [employer].  Cough, Adult Coughing is a reflex that clears your throat and your airways. Coughing helps to heal and protect your lungs. It is normal to cough occasionally, but a cough that happens with other symptoms or lasts a long time may be a sign of a condition that needs treatment. A cough may last only 2-3 weeks (acute), or it may last longer than 8 weeks (chronic). What are the causes? Coughing is commonly caused by:  Breathing in substances that irritate your lungs.  A viral or bacterial respiratory infection.  Allergies.  Asthma.  Postnasal drip.  Smoking.  Acid backing up from the stomach into the esophagus (gastroesophageal reflux).  Certain medicines.  Chronic lung problems, including COPD (or rarely, lung cancer).  Other medical conditions such as heart failure.  Follow  these instructions at home: Pay attention to any changes in your symptoms. Take these actions to help with your discomfort:  Take medicines only as told by your health care provider. ? If you were prescribed an antibiotic medicine, take it as told by your health care provider. Do not stop taking the antibiotic even if you start to feel better. ? Talk with your health care provider before you take a cough suppressant medicine.  Drink enough fluid to keep your  urine clear or pale yellow.  If the air is dry, use a cold steam vaporizer or humidifier in your bedroom or your home to help loosen secretions.  Avoid anything that causes you to cough at work or at home.  If your cough is worse at night, try sleeping in a semi-upright position.  Avoid cigarette smoke. If you smoke, quit smoking. If you need help quitting, ask your health care provider.  Avoid caffeine.  Avoid alcohol.  Rest as needed.  Contact a health care provider if:  You have new symptoms.  You cough up pus.  Your cough does not get better after 2-3 weeks, or your cough gets worse.  You cannot control your cough with suppressant medicines and you are losing sleep.  You develop pain that is getting worse or pain that is not controlled with pain medicines.  You have a fever.  You have unexplained weight loss.  You have night sweats. Get help right away if:  You cough up blood.  You have difficulty breathing.  Your heartbeat is very fast. This information is not intended to replace advice given to you by your health care provider. Make sure you discuss any questions you have with your health care provider. Document Released: 07/20/2010 Document Revised: 06/29/2015 Document Reviewed: 03/30/2014 Elsevier Interactive Patient Education  [employer].

## 2017-05-16 ENCOUNTER — Other Ambulatory Visit: Payer: Self-pay | Admitting: *Deleted

## 2017-05-16 ENCOUNTER — Telehealth: Payer: Self-pay | Admitting: Family Medicine

## 2017-05-16 ENCOUNTER — Encounter: Payer: Self-pay | Admitting: Family Medicine

## 2017-05-16 ENCOUNTER — Ambulatory Visit: Payer: PRIVATE HEALTH INSURANCE | Admitting: Family Medicine

## 2017-05-16 VITALS — BP 100/62 | HR 57 | Temp 97.7°F | Wt 139.0 lb

## 2017-05-16 DIAGNOSIS — K130 Diseases of lips: Secondary | ICD-10-CM

## 2017-05-16 MED ORDER — HYDROCORTISONE 0.5 % EX OINT: 1.0000 "application " | TOPICAL_OINTMENT | Freq: Two times a day (BID) | CUTANEOUS | 0 refills | Status: DC

## 2017-05-16 MED ORDER — HYDROCORTISONE 0.5 % EX OINT: 1.0000 "application " | TOPICAL_OINTMENT | Freq: Two times a day (BID) | CUTANEOUS | 0 refills | Status: AC

## 2017-05-16 NOTE — Telephone Encounter (Signed)
Rx rerouted to different pharmacy per patient request.

## 2017-05-16 NOTE — Patient Instructions (Signed)
Food Allergy A food allergy is when your body reacts to a food in a way that is not normal. The reaction can be gentle or very bad. Signs of a Gentle Reaction  Stuffy nose.  Tingling in the mouth.  An itchy, red rash.  Throwing up (vomiting).  Watery poop (diarrhea). Signs of a Very Bad Reaction  Puffiness (swelling). This may be on the lips, face, or tongue, or in the mouth or throat.  Breathing loudly (wheezing).  A hoarse voice.  Itchy, red, swollen areas of skin (hives).  Dizziness or light-headedness.  Fainting.  Trouble breathing or swallowing.  A tight feeling in the chest.  A very fast heartbeat. Follow these instructions at home: General instructions  Avoid the foods that you are allergic to.  Read food labels. Look for ingredients that you are allergic to.  When you are at a restaurant, tell your server that you have an allergy. Ask if your meal has an ingredient that you are allergic to.  Take medicines only as told by your doctor. Do not drive until the medicine has worn off, unless your doctor says it is okay.  Tell all people who care for you that you have a food allergy. This includes your doctor and dentist.  If you think that you might be allergic to something else, talk with your doctor. Do not eat a food to see if you are allergic to it without talking with your doctor first. If you have a very bad allergy:  Wear a bracelet or necklace that says you have an allergy.  Carry your allergy kit (anaphylaxis kit) or an allergy shot (epinephrine injection) with you all the time. Use them as told by your doctor.  Make sure that you, your family, and your boss know: ? How to use your allergy kit. ? How to give you an allergy shot.  If you use the medicine epinephrine: ? Get more right away in case you have another reaction. ? Get help. You can have a life-threatening reaction after taking the medicine. If you are being tested for an  allergy :  Follow a diet as told by your doctor.  Keep a food diary as told by your doctor. Every day, write down: ? What you eat and drink and when. ? What problems you have and when. Contact a doctor if:  The signs of your reaction have not gone away within 2 days.  The signs of your reaction get worse.  You have new signs of a reaction. Get help right away if:  You use the medicine epinephrine.  You are having a very bad reaction. Signs of a very bad reaction are: ? Puffiness. This may be on the lips, face, or tongue, or in the mouth or throat. ? Breathing loudly. ? A hoarse voice. ? Itchy, red swollen areas of skin. ? Dizziness or light-headedness. ? Fainting. ? Trouble breathing or swallowing. ? A tight feeling in the chest. ? A very fast heartbeat. This information is not intended to replace advice given to you by your health care provider. Make sure you discuss any questions you have with your health care provider. Document Released: 07/11/2009 Document Revised: 06/29/2015 Document Reviewed: 11/02/2013 Elsevier Interactive Patient Education  2018 [employer] Inc[employer]  

## 2017-05-16 NOTE — Telephone Encounter (Signed)
Copied from [place]. Topic: Quick Communication - Rx Refill/Question >> May 16, 2017  1:43 PM Robina Ade, Helene Kelp D wrote: Medication:hydrocortisone ointment 0.5 % Has the patient contacted their pharmacy? Yes but needs it sent to a different pharmacy  (Agent: If no, request that the patient contact the pharmacy for the refill.) Preferred Pharmacy (with phone number or street name): [facility] on [address] Agent: Please be advised that RX refills may take up to 3 business days. We ask that you follow-up with your pharmacy.

## 2017-05-16 NOTE — Progress Notes (Signed)
Subjective:    Patient ID: Elizabeth Contreras, female    DOB: Jun 25, 1980, 37 y.o.   MRN: [id number]  No chief complaint on file.   HPI Patient was seen today for acute concern.  Pt endorses bumps around her lips that burn times the last 3 days.  Pt denies recent change in diet, but does endorse eating a lot of mangoes recently.  Pt states that she did not eat the skin however she graded the fruit from this and with her teeth.  Pt also endorsed mild edema in hands and she was unable to put on her wedding rings.  Pt has not taking anything for her symptoms.  Past Medical History:  Diagnosis Date  . Asthma   . Common migraine with intractable migraine 06/20/2016  . Herpes   . Migraine without aura   . PCOS (polycystic ovarian syndrome)   . PTSD (post-traumatic stress disorder) 2015  . Sinusitis   . 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 Thyroid disease     Allergies  Allergen Reactions  . Topamax [Topiramate]     Cognitive slowing    ROS General: Denies fever, chills, night sweats, changes in weight, changes in appetite HEENT: Denies headaches, ear pain, changes in vision, rhinorrhea, sore throat  CV: Denies CP, palpitations, SOB, orthopnea Pulm: Denies SOB, cough, wheezing GI: Denies abdominal pain, nausea, vomiting, diarrhea, constipation GU: Denies dysuria, hematuria, frequency, vaginal discharge Msk: Denies muscle cramps, joint pains Neuro: Denies weakness, numbness, tingling Skin: Denies rashes, bruising  + lip edema with rash surrounding Psych: Denies depression, anxiety, hallucinations     Objective:    Blood pressure 100/62, pulse (!) 57, temperature 97.7 F (36.5 C), temperature source Oral, weight 139 lb (63 kg), SpO2 98 %.   Gen. Pleasant, well-nourished, in no distress, normal affect   HEENT: [place]/AT, face symmetric, moves edematous and mildly erythematous, appeared dry with fine papules around the vermilion.  Conjunctiva clear, no scleral icterus,  PERRLA, nares patent without drainage Lungs: no accessory muscle use, CTAB Cardiovascular: RRR, no peripheral edema    Wt Readings from Last 3 Encounters:  05/16/17 139 lb (63 kg)  05/05/17 139 lb (63 kg)  04/14/17 143 lb (64.9 kg)    Lab Results  Component Value Date   WBC 8.2 11/06/2016   HGB 13.3 11/06/2016   HCT 40.7 11/06/2016   PLT 243.0 11/06/2016   GLUCOSE 91 11/06/2016   NA 137 11/06/2016   K 3.9 11/06/2016   CL 102 11/06/2016   CREATININE 0.72 11/06/2016   BUN 11 11/06/2016   CO2 27 11/06/2016   TSH 3.15 02/05/2017    Assessment/Plan:  Cheilitis  -Patient educated on condition -Patient given handout -Patient also given tube of petroleum jelly - Plan: hydrocortisone ointment 0.5 % -Follow-up PRN  Grier Mitts, MD

## 2017-06-14 ENCOUNTER — Encounter: Payer: Self-pay | Admitting: Family Medicine

## 2017-06-14 DIAGNOSIS — B009 Herpesviral infection, unspecified: Secondary | ICD-10-CM

## 2017-06-16 MED ORDER — VALACYCLOVIR HCL 500 MG PO TABS: 500.0000 mg | ORAL_TABLET | Freq: Every day | ORAL | 4 refills | Status: DC

## 2017-07-22 ENCOUNTER — Encounter: Payer: Self-pay | Admitting: Family Medicine

## 2017-11-20 ENCOUNTER — Other Ambulatory Visit: Payer: Self-pay | Admitting: Family Medicine

## 2017-11-20 DIAGNOSIS — B009 Herpesviral infection, unspecified: Secondary | ICD-10-CM

## 2017-12-16 ENCOUNTER — Other Ambulatory Visit: Payer: Self-pay | Admitting: Family Medicine

## 2017-12-16 DIAGNOSIS — B009 Herpesviral infection, unspecified: Secondary | ICD-10-CM

## 2018-01-12 ENCOUNTER — Other Ambulatory Visit: Payer: Self-pay | Admitting: Family Medicine

## 2018-01-12 DIAGNOSIS — B009 Herpesviral infection, unspecified: Secondary | ICD-10-CM

## 2018-02-22 ENCOUNTER — Other Ambulatory Visit: Payer: Self-pay | Admitting: Family Medicine

## 2018-02-22 DIAGNOSIS — B009 Herpesviral infection, unspecified: Secondary | ICD-10-CM

## 2018-02-24 ENCOUNTER — Other Ambulatory Visit: Payer: Self-pay | Admitting: Family Medicine

## 2018-02-24 DIAGNOSIS — J4521 Mild intermittent asthma with (acute) exacerbation: Secondary | ICD-10-CM

## 2018-03-09 ENCOUNTER — Ambulatory Visit (INDEPENDENT_AMBULATORY_CARE_PROVIDER_SITE_OTHER): Payer: BLUE CROSS/BLUE SHIELD | Admitting: Family Medicine

## 2018-03-09 ENCOUNTER — Encounter: Payer: Self-pay | Admitting: Family Medicine

## 2018-03-09 VITALS — BP 98/64 | HR 68 | Temp 98.2°F | Wt 136.0 lb

## 2018-03-09 DIAGNOSIS — E039 Hypothyroidism, unspecified: Secondary | ICD-10-CM | POA: Diagnosis not present

## 2018-03-09 DIAGNOSIS — S46912A Strain of unspecified muscle, fascia and tendon at shoulder and upper arm level, left arm, initial encounter: Secondary | ICD-10-CM | POA: Diagnosis not present

## 2018-03-09 DIAGNOSIS — D367 Benign neoplasm of other specified sites: Secondary | ICD-10-CM

## 2018-03-09 DIAGNOSIS — N6001 Solitary cyst of right breast: Secondary | ICD-10-CM

## 2018-03-09 DIAGNOSIS — E038 Other specified hypothyroidism: Secondary | ICD-10-CM

## 2018-03-09 DIAGNOSIS — R413 Other amnesia: Secondary | ICD-10-CM | POA: Diagnosis not present

## 2018-03-09 LAB — CBC
HCT: 40.6 % (ref 36.0–46.0)
HEMOGLOBIN: 13.4 g/dL (ref 12.0–15.0)
MCHC: 33.1 g/dL (ref 30.0–36.0)
MCV: 92.5 fl (ref 78.0–100.0)
Platelets: 190 10*3/uL (ref 150.0–400.0)
RBC: 4.39 Mil/uL (ref 3.87–5.11)
RDW: 12.9 % (ref 11.5–15.5)
WBC: 9 10*3/uL (ref 4.0–10.5)

## 2018-03-09 LAB — TSH: TSH: 3.45 u[IU]/mL (ref 0.35–4.50)

## 2018-03-09 LAB — FOLATE: Folate: 16.3 ng/mL (ref >5.9)

## 2018-03-09 LAB — T4, FREE: Free T4: 0.83 ng/dL (ref 0.60–1.60)

## 2018-03-09 LAB — VITAMIN B12: Vitamin B-12: 474 pg/mL (ref 211–911)

## 2018-03-09 LAB — VITAMIN D 25 HYDROXY (VIT D DEFICIENCY, FRACTURES): VITD: 56.08 ng/mL (ref 30.00–100.00)

## 2018-03-09 NOTE — Progress Notes (Signed)
Subjective:    Patient ID: Elizabeth Contreras, female    DOB: 01-11-1981, 38 y.o.   MRN: [id number]  No chief complaint on file.   HPI Patient was seen today for acute concern.  Pt notes lump in breast.  initally noted 2 months ago.  Pt states it has remained so she decided to have it checked.  Pt denies breast pain, nipple d/c, inversion, or skin changes.  Pt has a spot on her R arm she would like checked.  Pt state she was told the are was a cyst. Present x 10 yrs. It has become slightly larger and the skin feels tight but not uncomfortable.  The skin over the area has become slightly darker in color.  Pt dx'd with subclinical hypothyroidism.  Has not had a recent TSH, T4 recheck.  Pt also notes memory concerns.  States having trouble remembering names of her husband's family members that have passed.  L shoulder pain x a few wks.  Pt endorses working out more than her normal.  Notes doing push ups.  Pt has tried to rest but has continued to work out.  Pt inquires about it being safe to take valtrex 500 mg daily.  Pt has been taking the med to keep her husband from getting HSV1.  Pt denies recent outbreaks.  Past Medical History:  Diagnosis Date  . Asthma   . Common migraine with intractable migraine 06/20/2016  . Herpes   . Migraine without aura   . PCOS (polycystic ovarian syndrome)   . PTSD (post-traumatic stress disorder) 2015  . Sinusitis   . 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 Thyroid disease     Allergies  Allergen Reactions  . Topamax [Topiramate]     Cognitive slowing    ROS General: Denies fever, chills, night sweats, changes in weight, changes in appetite HEENT: Denies headaches, ear pain, changes in vision, rhinorrhea, sore throat CV: Denies CP, palpitations, SOB, orthopnea Pulm: Denies SOB, cough, wheezing GI: Denies abdominal pain, nausea, vomiting, diarrhea, constipation GU: Denies dysuria, hematuria, frequency, vaginal discharge  +lump in  R breast. Msk: Denies muscle cramps, joint pains Neuro: Denies weakness, numbness, tingling Skin: Denies rashes, bruising +bump on R arm. Psych: Denies depression, anxiety, hallucinations      Objective:    Blood pressure 98/64, pulse 68, temperature 98.2 F (36.8 C), temperature source Oral, weight 136 lb (61.7 kg), last menstrual period 02/21/2018, SpO2 98 %.  Gen. Pleasant, well-nourished, in no distress, normal affect   HEENT: /AT, face symmetric, no scleral icterus, PERRLA, nares patent without drainage  Lungs: no accessory muscle use, CTAB, no wheezes or rales Cardiovascular: RRR, no m/r/g, noperipheral edema GU:  Dense fibrocystic breast tissue b/l.  R lateral breast at 9 o' clock position with 8 mm round mobile mass.  No skin changes, nipple inversion, or nipple d/c. Musculoskeletal: No TTP of LUE.  No deformities, no cyanosis or clubbing, normal tone Neuro:  A&Ox3, CN II-XII intact, normal gait Skin:  Warm, no lesions/ rash.  Nodule of R arm.   Wt Readings from Last 3 Encounters:  03/09/18 136 lb (61.7 kg)  05/16/17 139 lb (63 kg)  05/05/17 139 lb (63 kg)    Lab Results  Component Value Date   WBC 8.2 11/06/2016   HGB 13.3 11/06/2016   HCT 40.7 11/06/2016   PLT 243.0 11/06/2016   GLUCOSE 91 11/06/2016   NA 137 11/06/2016   K 3.9 11/06/2016  CL 102 11/06/2016   CREATININE 0.72 11/06/2016   BUN 11 11/06/2016   CO2 27 11/06/2016   TSH 3.15 02/05/2017    Assessment/Plan:  Cyst of right breast  - Plan: US BREAST COMPLETE UNI RIGHT INC AXILLA  Dermoid cyst of arm, right  - Plan: Ambulatory referral to Dermatology  Subclinical hypothyroidism  - Plan: TSH, T4, free  Strain of left shoulder, initial encounter -discussed rest, ice, heat, NSAIDs -given handout  Memory deficit  - Plan: TSH, T4, free, Vitamin D, 25-hydroxy, CBC (no diff), Folate, Vitamin B12   F/u prn  Grier Mitts, MD

## 2018-03-09 NOTE — Patient Instructions (Signed)
Breast Cyst  A breast cyst is a sac in the breast that is filled with fluid. Breast cysts are usually noncancerous (benign). They are common among women, and they are most often located in the upper, outer portion of the breast. One or more cysts may develop. They form when fluid builds up inside of the breast glands. There are several types of breast cysts:  Macrocyst. This is a cyst that is about 2 inches (5.1 cm) across (in diameter).  Microcyst. This is a very small cyst that you cannot feel, but it can be seen with imaging tests such as an X-ray of the breast (mammogram) or ultrasound.  Galactocele. This is a cyst that contains milk. It may develop if you suddenly stop breastfeeding. Breast cysts do not increase your risk of breast cancer. They usually disappear after menopause, unless you take artificial hormones (are on hormone therapy). What are the causes? The exact cause of breast cysts is not known. Possible causes include:  Blockage of tubes (ducts) in the breast glands, which leads to fluid buildup. Duct blockage may result from: ? Fibrocystic breast changes. This is a common, benign condition that occurs when women go through hormonal changes during the menstrual cycle. This is a common cause of multiple breast cysts. ? Overgrowth of breast tissue or breast glands. ? Scar tissue in the breast from previous surgery.  Changes in certain female hormones (estrogen and progesterone). What increases the risk? You may be more likely to develop breast cysts if you have not gone through menopause. What are the signs or symptoms? Symptoms of a breast cyst may include:  Feeling one or more smooth, round, soft lumps (like grapes) in the breast that are easily moveable. The lump(s) may get bigger and more painful before your period and get smaller after your period.  Breast discomfort or pain. How is this diagnosed? A cyst can be felt during a physical exam by your health care provider.  A mammogram and ultrasound will be done to confirm the diagnosis. Fluid may be removed from the cyst with a needle (fine-needle aspiration) and tested to make sure the cyst is not cancerous. How is this treated? Treatment may not be necessary. Your health care provider may monitor the cyst to see if it goes away on its own. If the cyst is uncomfortable or gets bigger, or if you do not like how the cyst makes your breast look, you may need treatment. Treatment may include:  Hormone treatment.  Fine-needle aspiration, to drain fluid from the cyst. There is a chance of the cyst coming back (recurring) after aspiration.  Surgery to remove the cyst. Follow these instructions at home:  See your health care provider regularly. ? Get a yearly physical exam. ? If you are 36-49 years old, get a clinical breast exam every 1-3 years. After age 47, get this exam every year. ? Get mammograms as often as directed.  Do a breast self-exam every month, or as often as directed. Having many breast cysts, or "lumpy" breasts, may make it harder to feel for new lumps. Understand how your breasts normally look and feel, and write down any changes in your breasts so you can tell your health care provider about the changes. A breast self-exam involves: ? Comparing your breasts in the mirror. ? Looking for visible changes in your skin or nipples. ? Feeling for lumps or changes.  Take over-the-counter and prescription medicines only as told by your health care provider.  Wear  a supportive bra, especially when exercising.  Follow instructions from your health care provider about eating and drinking restrictions. ? Avoid caffeine. ? Cut down on salt (sodium) in what you eat and drink, especially before your menstrual period. Too much sodium can cause fluid buildup (retention), breast swelling, and discomfort.  Keep all follow-up visits as told your health care provider. This is important. Contact a health care  provider if:  You feel, or think you feel, a lump in your breast.  You notice that both breasts look or feel different than usual.  Your breast is still causing pain after your menstrual period is over.  You find new lumps or bumps that were not there before.  You feel lumps in your armpit (axilla). Get help right away if:  You have severe pain, tenderness, redness, or warmth in your breast.  You have fluid or blood leaking from your nipple.  Your breast lump becomes hard and painful.  You notice dimpling or wrinkling of the breast or nipple. This information is not intended to replace advice given to you by your health care provider. Make sure you discuss any questions you have with your health care provider. Document Released: 01/21/2005 Document Revised: 10/13/2015 Document Reviewed: 10/13/2015 Elsevier Interactive Patient Education  2019 [place].  Muscle Strain A muscle strain is an injury that occurs when a muscle is stretched beyond its normal length. Usually, a small number of muscle fibers are torn when this happens. There are three types of muscle strains. First-degree strains have the least amount of muscle fiber tearing and the least amount of pain. Second-degree and third-degree strains have more tearing and pain. Usually, recovery from muscle strain takes 1-2 weeks. Complete healing normally takes 5-6 weeks. What are the causes? This condition is caused when a sudden, violent force is placed on a muscle and stretches it too far. This may occur with a fall, lifting, or sports. What increases the risk? This condition is more likely to develop in athletes and people who are physically active. What are the signs or symptoms? Symptoms of this condition include:  Pain.  Bruising.  Swelling.  Trouble using the muscle. How is this diagnosed? This condition is diagnosed based on a physical exam and your medical history. Tests may also be done, including an X-ray,  ultrasound, or MRI. How is this treated? This condition is initially treated with PRICE therapy. This therapy involves:  Protecting the muscle from being injured again.  Resting the injured muscle.  Icing the injured muscle.  Applying pressure (compression) to the injured muscle. This may be done with a splint or elastic bandage.  Raising (elevating) the injured muscle. Your health care provider may also recommend medicine for pain. Follow these instructions at home: If you have a splint:  Wear the splint as told by your health care provider. Remove it only as told by your health care provider.  Loosen the splint if your fingers or toes tingle, become numb, or turn cold and blue.  Keep the splint clean.  If the splint is not waterproof: ? Do not let it get wet. ? Cover it with a watertight covering when you take a bath or a shower. Managing pain, stiffness, and swelling   If directed, put ice on the injured area. ? If you have a removable splint, remove it as told by your health care provider. ? Put ice in a plastic bag. ? Place a towel between your skin and the bag. ? Leave  the ice on for 20 minutes, 2-3 times a day.  Move your fingers or toes often to avoid stiffness and to lessen swelling.  Raise (elevate) the injured area above the level of your heart while you are sitting or lying down.  Wear an elastic bandage as told by your health care provider. Make sure that it is not too tight. General instructions  Take over-the-counter and prescription medicines only as told by your health care provider.  Restrict your activity and rest the injured muscle as told by your health care provider. Gentle movements may be allowed.  If physical therapy was prescribed, do exercises as told by your health care provider.  Do not put pressure on any part of the splint until it is fully hardened. This may take several hours.  Do not use any products that contain nicotine or tobacco,  such as cigarettes and e-cigarettes. These can delay bone healing. If you need help quitting, ask your health care provider.  Ask your health care provider when it is safe to drive if you have a splint.  Keep all follow-up visits as told by your health care provider. This is important. How is this prevented?  Warm up before exercising. This helps to prevent future muscle strains. Contact a health care provider if:  You have more pain or swelling in the injured area. Get help right away if:  You have numbness or tingling or lose a lot of strength in the injured area. Summary  A muscle strain is an injury that occurs when a muscle is stretched beyond its normal length.  This condition is caused when a sudden, violent force is placed on a muscle and stretches it too far.  This condition is initially treated with PRICE therapy, which involves protecting, resting, icing, compressing, and elevating.  Gentle movements may be allowed. If physical therapy was prescribed, do exercises as told by your health care provider. This information is not intended to replace advice given to you by your health care provider. Make sure you discuss any questions you have with your health care provider. Document Released: 01/21/2005 Document Revised: 02/28/2016 Document Reviewed: 02/28/2016 Elsevier Interactive Patient Education  2019 [place].  Epidermal Cyst  An epidermal cyst is a small, painless lump under your skin. The cyst contains a grayish-white, bad-smelling substance (keratin). Do not try to pop or open an epidermal cyst yourself. What are the causes?  A blocked hair follicle.  A hair that curls and re-enters the skin instead of growing straight out of the skin.  A blocked pore.  Irritated skin.  An injury to the skin.  Certain conditions that are passed along from parent to child (inherited).  Human papillomavirus (HPV).  Long-term sun damage to the skin. What increases the  risk?  Having acne.  Being overweight.  Being 42-58 years old. What are the signs or symptoms? These cysts are usually harmless, but they can get infected. Symptoms of infection may include:  Redness.  Inflammation.  Tenderness.  Warmth.  Fever.  A grayish-white, bad-smelling substance drains from the cyst.  Pus drains from the cyst. How is this treated? In many cases, epidermal cysts go away on their own without treatment. If a cyst becomes infected, treatment may include:  Opening and draining the cyst, done by a doctor. After draining, you may need minor surgery to remove the rest of the cyst.  Antibiotic medicine.  Shots of medicines (steroids) that help to reduce inflammation.  Surgery to remove the  cyst. Surgery may be done if the cyst: ? Becomes large. ? Bothers you. ? Has a chance of turning into cancer.  Do not try to open a cyst yourself. Follow these instructions at home:  Take over-the-counter and prescription medicines only as told by your doctor.  If you were prescribed an antibiotic medicine, take it it as told by your doctor. Do not stop using the antibiotic even if you start to feel better.  Keep the area around your cyst clean and dry.  Wear loose, dry clothing.  Avoid touching your cyst.  Check your cyst every day for signs of infection. Check for: ? Redness, swelling, or pain. ? Fluid or blood. ? Warmth. ? Pus or a bad smell.  Keep all follow-up visits as told by your doctor. This is important. How is this prevented?  Wear clean, dry, clothing.  Avoid wearing tight clothing.  Keep your skin clean and dry. Take showers or baths every day. Contact a doctor if:  Your cyst has symptoms of infection.  Your condition does not improve or gets worse.  You have a cyst that looks different from other cysts you have had.  You have a fever. Get help right away if:  Redness spreads from the cyst into the area close by. Summary  An  epidermal cyst is a sac made of skin tissue.  If a cyst becomes infected, treatment may include surgery to open and drain the cyst, or to remove it.  Take over-the-counter and prescription medicines only as told by your doctor.  Contact a doctor if your condition is not improving or is getting worse.  Keep all follow-up visits as told by your doctor. This is important. This information is not intended to replace advice given to you by your health care provider. Make sure you discuss any questions you have with your health care provider. Document Released: 02/29/2004 Document Revised: 08/04/2017 Document Reviewed: 11/23/2014 Elsevier Interactive Patient Education  2019 [employer].

## 2018-03-19 ENCOUNTER — Other Ambulatory Visit: Payer: Self-pay | Admitting: Family Medicine

## 2018-03-19 DIAGNOSIS — N6001 Solitary cyst of right breast: Secondary | ICD-10-CM

## 2018-03-27 ENCOUNTER — Other Ambulatory Visit: Payer: Self-pay | Admitting: Family Medicine

## 2018-03-27 DIAGNOSIS — B009 Herpesviral infection, unspecified: Secondary | ICD-10-CM

## 2018-04-02 ENCOUNTER — Ambulatory Visit
Admission: RE | Admit: 2018-04-02 | Discharge: 2018-04-02 | Disposition: A | Discharge status: Home / Self Care | Payer: BLUE CROSS/BLUE SHIELD | Source: Ambulatory Visit | Attending: Family Medicine | Admitting: Family Medicine

## 2018-04-02 DIAGNOSIS — N6011 Diffuse cystic mastopathy of right breast: Secondary | ICD-10-CM | POA: Diagnosis not present

## 2018-04-02 DIAGNOSIS — N6001 Solitary cyst of right breast: Secondary | ICD-10-CM

## 2018-04-02 DIAGNOSIS — R922 Inconclusive mammogram: Secondary | ICD-10-CM | POA: Diagnosis not present

## 2018-07-06 ENCOUNTER — Other Ambulatory Visit: Payer: Self-pay

## 2018-07-06 ENCOUNTER — Encounter: Payer: Self-pay | Admitting: Family Medicine

## 2018-07-06 ENCOUNTER — Ambulatory Visit: Payer: Self-pay

## 2018-07-06 ENCOUNTER — Ambulatory Visit (INDEPENDENT_AMBULATORY_CARE_PROVIDER_SITE_OTHER): Payer: BLUE CROSS/BLUE SHIELD | Admitting: Family Medicine

## 2018-07-06 DIAGNOSIS — F419 Anxiety disorder, unspecified: Secondary | ICD-10-CM | POA: Diagnosis not present

## 2018-07-06 DIAGNOSIS — J4521 Mild intermittent asthma with (acute) exacerbation: Secondary | ICD-10-CM

## 2018-07-06 DIAGNOSIS — B349 Viral infection, unspecified: Secondary | ICD-10-CM

## 2018-07-06 DIAGNOSIS — L729 Follicular cyst of the skin and subcutaneous tissue, unspecified: Secondary | ICD-10-CM
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
## 2018-07-06 NOTE — Telephone Encounter (Signed)
Pt is scheduled for today  °
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
## 2018-07-08 ENCOUNTER — Encounter: Payer: Self-pay | Admitting: Family Medicine

## 2018-07-08 ENCOUNTER — Other Ambulatory Visit: Payer: Self-pay

## 2018-07-08 ENCOUNTER — Ambulatory Visit (INDEPENDENT_AMBULATORY_CARE_PROVIDER_SITE_OTHER): Payer: BLUE CROSS/BLUE SHIELD | Admitting: Family Medicine

## 2018-07-08 DIAGNOSIS — L729 Follicular cyst of the skin and subcutaneous tissue, unspecified: Secondary | ICD-10-CM

## 2018-07-08 NOTE — Progress Notes (Signed)
Virtual Visit via Video Note Pt initially given an in office appointment, however given recent febrile illness pt advised not to come into office. I connected with Chari Manning. [place] on 07/08/18 at  2:30 PM EDT by a video enabled telemedicine application and verified that I am speaking with the correct person using two identifiers.  Location patient: home Location provider:work or home office Persons participating in the virtual visit: patient, provider  I discussed the limitations of evaluation and management by telemedicine and the availability of in person appointments. The patient expressed understanding and agreed to proceed.   HPI: Pt following up on cyst on L lateral chest/upper flank.  Area at times is irritated by pt's bra strap.  Area without increased warmth or drainage.  Pt denies fever, chills, n/v.  Area discussed during virtual visit on Monday 6/1.  Unable to see the area at the time of visit.  Pt's in office appt today canceled 2/2 her recent febrile viral illness in light of the COVID-19 pandemic.     ROS: See pertinent positives and negatives per HPI.  Past Medical History:  Diagnosis Date  . Asthma   . Common migraine with intractable migraine 06/20/2016  . Herpes   . Migraine without aura   . PCOS (polycystic ovarian syndrome)   . PTSD (post-traumatic stress disorder) 2015  . Sinusitis   . 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 Thyroid disease     Past Surgical History:  Procedure Laterality Date  . COLPOSCOPY    . NASAL SINUS SURGERY  07/2015    Family History  Problem Relation Age of Onset  . Migraines Mother   . Aneurysm Mother   . Cancer Maternal Aunt        ovarian  . Cancer Paternal Uncle        colon     Current Outpatient Medications:  .  5-Hydroxytryptophan (5-HTP) 50 MG CAPS, Take 50 mg by mouth. , Disp: , Rfl:  .  acetaminophen (TYLENOL) 325 MG tablet, Take 650 mg by mouth every 6 (six) hours as needed., Disp: , Rfl:  .   ALPRAZolam (XANAX) 0.25 MG tablet, Take 1 tablet (0.25 mg total) by mouth 2 (two) times daily as needed for anxiety., Disp: 20 tablet, Rfl: 0 .  b complex-C-folic acid 1 MG capsule, Take 1 capsule by mouth daily., Disp: , Rfl:  .  Cholecalciferol (VITAMIN D) 2000 units CAPS, Take by mouth., Disp: , Rfl:  .  fluticasone (FLONASE) 50 MCG/ACT nasal spray, Place 1 spray into both nostrils daily., Disp: 16 g, Rfl: 3 .  ibuprofen (ADVIL,MOTRIN) 200 MG tablet, Take 200 mg by mouth every 6 (six) hours as needed., Disp: , Rfl:  .  Loratadine (CLARITIN PO), Take by mouth daily at 12 noon., Disp: , Rfl:  .  PROAIR HFA 108 (90 Base) MCG/ACT inhaler, INHALE 2 PUFFS EVERY 6 HOURS AS NEEDED FOR WHEEZE OR SHORTNESS OF BREATH, Disp: 8.5 Inhaler, Rfl: 1 .  Probiotic Product (PROBIOTIC-10 PO), Take by mouth daily., Disp: , Rfl:  .  UNABLE TO FIND, Take 1 Dose by mouth daily. Med Name: CBD oil 20MG  nightly, Disp: , Rfl:  .  valACYclovir (VALTREX) 500 MG tablet, TAKE 1 TABLET BY MOUTH EVERY DAY, Disp: 30 tablet, Rfl: 5  EXAM:  VITALS per patient if applicable:  RR between 12-20 bpm  GENERAL: alert, oriented, appears well and in no acute distress  HEENT: atraumatic, conjunctiva clear, no obvious  abnormalities on inspection of external nose and ears  NECK: normal movements of the head and neck  LUNGS: on inspection no signs of respiratory distress, breathing rate appears normal, no obvious gross SOB, gasping or wheezing  CV: no obvious cyanosis  MS: moves all visible extremities without noticeable abnormality  PSYCH/NEURO: pleasant and cooperative, no obvious depression or anxiety, speech and thought processing grossly intact  ASSESSMENT AND PLAN:  Discussed the following assessment and plan:  Cyst of skin  -questions regarding area answered -discussed avoiding garments that can rub/irritate area.  Ok to use ice. -pt given precautions -will tentatively plan to have pt in office next wks for further  eval of area and possible I&D.  F/u prn next wk.   I discussed the assessment and treatment plan with the patient. The patient was provided an opportunity to ask questions and all were answered. The patient agreed with the plan and demonstrated an understanding of the instructions.   The patient was advised to call back or seek an in-person evaluation if the symptoms worsen or if the condition fails to improve as anticipated.  Billie Ruddy, MD

## 2018-07-15 ENCOUNTER — Encounter: Payer: Self-pay | Admitting: Family Medicine

## 2018-07-15 ENCOUNTER — Other Ambulatory Visit: Payer: Self-pay

## 2018-07-15 ENCOUNTER — Ambulatory Visit (INDEPENDENT_AMBULATORY_CARE_PROVIDER_SITE_OTHER): Payer: BLUE CROSS/BLUE SHIELD | Admitting: Family Medicine

## 2018-07-15 VITALS — BP 98/70 | HR 60 | Temp 97.6°F | Wt 135.0 lb

## 2018-07-15 DIAGNOSIS — T3695XA Adverse effect of unspecified systemic antibiotic, initial encounter: Secondary | ICD-10-CM | POA: Diagnosis not present

## 2018-07-15 DIAGNOSIS — L0291 Cutaneous abscess, unspecified: Secondary | ICD-10-CM

## 2018-07-15 DIAGNOSIS — B379 Candidiasis, unspecified: Secondary | ICD-10-CM

## 2018-07-15 MED ORDER — SULFAMETHOXAZOLE-TRIMETHOPRIM 800-160 MG PO TABS: 1.0000 | ORAL_TABLET | Freq: Two times a day (BID) | ORAL | 0 refills | Status: AC

## 2018-07-15 MED ORDER — FLUCONAZOLE 150 MG PO TABS: 150.0000 mg | ORAL_TABLET | Freq: Once | ORAL | 0 refills | Status: AC

## 2018-07-15 NOTE — Patient Instructions (Addendum)
Skin Abscess  A skin abscess is an infected area on or under your skin that contains a collection of pus and other material. An abscess may also be called a furuncle, carbuncle, or boil. An abscess can occur in or on almost any part of your body. Some abscesses break open (rupture) on their own. Most continue to get worse unless they are treated. The infection can spread deeper into the body and eventually into your blood, which can make you feel ill. Treatment usually involves draining the abscess. What are the causes? An abscess occurs when germs, like bacteria, pass through your skin and cause an infection. This may be caused by:  A scrape or cut on your skin.  A puncture wound through your skin, including a needle injection or insect bite.  Blocked oil or sweat glands.  Blocked and infected hair follicles.  A cyst that forms beneath your skin (sebaceous cyst) and becomes infected. What increases the risk? This condition is more likely to develop in people who:  Have a weak body defense system (immune system).  Have diabetes.  Have dry and irritated skin.  Get frequent injections or use illegal IV drugs.  Have a foreign body in a wound, such as a splinter.  Have problems with their lymph system or veins. What are the signs or symptoms? Symptoms of this condition include:  A painful, firm bump under the skin.  A bump with pus at the top. This may break through the skin and drain. Other symptoms include:  Redness surrounding the abscess site.  Warmth.  Swelling of the lymph nodes (glands) near the abscess.  Tenderness.  A sore on the skin. How is this diagnosed? This condition may be diagnosed based on:  A physical exam.  Your medical history.  A sample of pus. This may be used to find out what is causing the infection.  Blood tests.  Imaging tests, such as an ultrasound, CT scan, or MRI. How is this treated? A small abscess that drains on its own may not  need treatment. Treatment for larger abscesses may include:  Moist heat or heat pack applied to the area several times a day.  A procedure to drain the abscess (incision and drainage).  Antibiotic medicines. For a severe abscess, you may first get antibiotics through an IV and then change to antibiotics by mouth. Follow these instructions at home: Medicines   Take over-the-counter and prescription medicines only as told by your health care provider.  If you were prescribed an antibiotic medicine, take it as told by your health care provider. Do not stop taking the antibiotic even if you start to feel better. Abscess care   If you have an abscess that has not drained, apply heat to the affected area. Use the heat source that your health care provider recommends, such as a moist heat pack or a heating pad. ? Place a towel between your skin and the heat source. ? Leave the heat on for 20-30 minutes. ? Remove the heat if your skin turns bright red. This is especially important if you are unable to feel pain, heat, or cold. You may have a greater risk of getting burned.  Follow instructions from your health care provider about how to take care of your abscess. Make sure you: ? Cover the abscess with a bandage (dressing). ? Change your dressing or gauze as told by your health care provider. ? Wash your hands with soap and water before you change the   as told by your health care provider.  Wash your hands with soap and water before you change the dressing or gauze. If soap and water are not available, use hand sanitizer.  Check your abscess every day for signs of a worsening infection. Check for:  More redness, swelling, or pain.  More fluid or blood.  Warmth.  More pus or a bad smell.  General instructions  To avoid spreading the infection:  Do not share personal care items, towels, or hot tubs with others.  Avoid making skin contact with other people.  Keep all follow-up visits as told by your health care provider. This is important.  Contact a health care provider if you have:  More redness, swelling, or pain around your abscess.  More fluid or blood coming from  your abscess.  Warm skin around your abscess.  More pus or a bad smell coming from your abscess.  A fever.  Muscle aches.  Chills or a general ill feeling.  Get help right away if you:  Have severe pain.  See red streaks on your skin spreading away from the abscess.  Summary  A skin abscess is an infected area on or under your skin that contains a collection of pus and other material.  A small abscess that drains on its own may not need treatment.  Treatment for larger abscesses may include having a procedure to drain the abscess and taking an antibiotic.  This information is not intended to replace advice given to you by your health care provider. Make sure you discuss any questions you have with your health care provider.  Document Released: 10/31/2004 Document Revised: 03/06/2017 Document Reviewed: 03/06/2017  Elsevier Interactive Patient Education  2019 [employer]    Incision and Drainage, Care After  Refer to this sheet in the next few weeks. These instructions provide you with information about caring for yourself after your procedure. Your health care provider may also give you more specific instructions. Your treatment has been planned according to current medical practices, but problems sometimes occur. Call your health care provider if you have any problems or questions after your procedure.  What can I expect after the procedure?  After the procedure, it is common to have:  Pain or discomfort around your incision site.  Drainage from your incision.  Follow these instructions at home:  Take over-the-counter and prescription medicines only as told by your health care provider.  If you were prescribed an antibiotic medicine, take it as told by your health care provider. Do not stop taking the antibiotic even if you start to feel better.  Follow instructions from your health care provider about:  How to take care of your incision.  When and how you should change your packing and bandage (dressing). Wash  your hands with soap and water before you change your dressing. If soap and water are not available, use hand sanitizer.  When you should remove your dressing.  Do not take baths, swim, or use a hot tub until your health care provider approves.  Keep all follow-up visits as told by your health care provider. This is important.  Check your incision area every day for signs of infection. Check for:  More redness, swelling, or pain.  More fluid or blood.  Warmth.  Pus or a bad smell.  Contact a health care provider if:  Your cyst or abscess returns.  You have a fever.  You have more redness, swelling, or pain around your incision.    You have more fluid or blood coming from your incision.  Your incision feels warm to the touch.  You have pus or a bad smell coming from your incision.  Get help right away if:  You have severe pain or bleeding.  You cannot eat or drink without vomiting.  You have decreased urine output.  You become short of breath.  You have chest pain.  You cough up blood.  The area where the incision and drainage occurred becomes numb or it tingles.  This information is not intended to replace advice given to you by your health care provider. Make sure you discuss any questions you have with your health care provider.  Document Released: 04/15/2011 Document Revised: 06/23/2015 Document Reviewed: 11/11/2014  Elsevier Interactive Patient Education  2019 [employer]

## 2018-07-15 NOTE — Progress Notes (Signed)
Subjective:    Patient ID: Kristopher Oppenheim, female    DOB: May 23, 1980, 38 y.o.   MRN: [id number]  No chief complaint on file.   HPI Patient was seen today for f/u on abscess with possible I&D.  Area present on L upper flank inferior to L breast x a while.  Irritated by bra rubbing on it.  Nonpainful, mild erythema.  Denies drainage, erythema, increased warmth.  Past Medical History:  Diagnosis Date  . Asthma   . Common migraine with intractable migraine 06/20/2016  . Herpes   . Migraine without aura   . PCOS (polycystic ovarian syndrome)   . PTSD (post-traumatic stress disorder) 2015  . Sinusitis   . 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 Thyroid disease     Allergies  Allergen Reactions  . Topamax [Topiramate]     Cognitive slowing    ROS General: Denies fever, chills, night sweats, changes in weight, changes in appetite HEENT: Denies headaches, ear pain, changes in vision, rhinorrhea, sore throat CV: Denies CP, palpitations, SOB, orthopnea Pulm: Denies SOB, cough, wheezing GI: Denies abdominal pain, nausea, vomiting, diarrhea, constipation GU: Denies dysuria, hematuria, frequency, vaginal discharge Msk: Denies muscle cramps, joint pains Neuro: Denies weakness, numbness, tingling Skin: Denies rashes, bruising   +abscess Psych: Denies depression, anxiety, hallucinations     Objective:    Blood pressure 98/70, pulse 60, temperature 97.6 F (36.4 C), temperature source Oral, weight 135 lb (61.2 kg), SpO2 99 %.   Gen. Pleasant, well-nourished, anxious, in no distress, normal affect   HEENT: [place]/AT, face symmetric, conjunctiva clear, no scleral icterus, PERRLA, nares patent without drainage Lungs: no accessory muscle use Cardiovascular: RRR, no peripheral edema Neuro:  A&Ox3, CN II-XII intact, normal gait Skin:  Warm, no rash.  1.5 cm abscess on L upper flank lateral to inferior lateral L breast.   Wt Readings from Last 3 Encounters:   07/15/18 135 lb (61.2 kg)  03/09/18 136 lb (61.7 kg)  05/16/17 139 lb (63 kg)    Lab Results  Component Value Date   WBC 9.0 03/09/2018   HGB 13.4 03/09/2018   HCT 40.6 03/09/2018   PLT 190.0 03/09/2018   GLUCOSE 91 11/06/2016   NA 137 11/06/2016   K 3.9 11/06/2016   CL 102 11/06/2016   CREATININE 0.72 11/06/2016   BUN 11 11/06/2016   CO2 27 11/06/2016   TSH 3.45 03/09/2018    Incision and Drainage Procedure Note  Pre-operative Diagnosis: abscess  Post-operative Diagnosis: same  Anesthesia: 1% lidocaine with epinephrine  Procedure Details  The procedure, risks and complications have been discussed in detail (including, but not limited to airway compromise, infection, bleeding) with the patient, and the patient has signed consent to the procedure.  The skin was sterilely prepped and draped over the affected area in the usual fashion. After adequate local anesthesia, I&D with a #11 and 15 blade was performed on the left upper flank, inferior to L breast. Purulent drainage: present The patient was observed until stable.  EBL: minimal  Condition: Tolerated procedure well and Stable   Complications: none.   Assessment/Plan:  Abscess  -I&D preformed.  See procedure note above -given precautions -given handouts - Plan: sulfamethoxazole-trimethoprim (BACTRIM DS) 800-160 MG tablet  Antibiotic-induced yeast infection  - Plan: fluconazole (DIFLUCAN) 150 MG tablet  F/u prn  Grier Mitts, MD

## 2018-10-02 ENCOUNTER — Encounter: Payer: Self-pay | Admitting: Family Medicine

## 2018-10-02 ENCOUNTER — Other Ambulatory Visit: Payer: Self-pay | Admitting: Family Medicine

## 2018-10-02 DIAGNOSIS — B009 Herpesviral infection, unspecified: Secondary | ICD-10-CM

## 2018-10-16 ENCOUNTER — Other Ambulatory Visit: Payer: Self-pay

## 2018-10-19 ENCOUNTER — Other Ambulatory Visit: Payer: Self-pay

## 2018-10-19 ENCOUNTER — Ambulatory Visit: Payer: BC Managed Care – PPO | Admitting: Obstetrics and Gynecology

## 2018-10-19 ENCOUNTER — Other Ambulatory Visit (HOSPITAL_COMMUNITY)
Admission: RE | Admit: 2018-10-19 | Discharge: 2018-10-19 | Disposition: A | Discharge status: Home / Self Care | Payer: BC Managed Care – PPO | Source: Ambulatory Visit | Attending: Obstetrics and Gynecology | Admitting: Obstetrics and Gynecology

## 2018-10-19 ENCOUNTER — Encounter: Payer: Self-pay | Admitting: Obstetrics and Gynecology

## 2018-10-19 VITALS — BP 100/62 | HR 72 | Temp 97.1°F | Ht 66.0 in | Wt 137.0 lb

## 2018-10-19 DIAGNOSIS — Z01419 Encounter for gynecological examination (general) (routine) without abnormal findings: Secondary | ICD-10-CM

## 2018-10-19 DIAGNOSIS — Z0000 Encounter for general adult medical examination without abnormal findings: Secondary | ICD-10-CM

## 2018-10-19 DIAGNOSIS — Z9889 Other specified postprocedural states: Secondary | ICD-10-CM

## 2018-10-19 DIAGNOSIS — N913 Primary oligomenorrhea: Secondary | ICD-10-CM | POA: Diagnosis not present

## 2018-10-19 DIAGNOSIS — Z23 Encounter for immunization: Secondary | ICD-10-CM | POA: Diagnosis not present

## 2018-10-19 DIAGNOSIS — Z124 Encounter for screening for malignant neoplasm of cervix: Secondary | ICD-10-CM

## 2018-10-19 NOTE — Progress Notes (Signed)
38 y.o. G0P0000 Married White or Caucasian Not Hispanic or Latino female here for annual exam.  Not using contraception, h/o female factor infertility. No desire for children.  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   She had a mammogram in 2/20 for a lump she felt on exam. She was noted to have 2 small cysts, dense breast tissue category D. She has questions about breast density.   Her cycles have always been irregular. Range from 30-45 days (no change). Normal TSH in 2/20. Recently she is spotting up to 3 days prior to her cycle. Total of 6 days of bleeding. Moderate flow, she would need to change a regular tampon in 3-4 hours. No BTB. Never bleeding for more than 6 days. She denies galactorrhea.  Period Cycle (Days): 35 Period Duration (Days): 5-6 days Period Pattern: Regular Menstrual Flow: Light, Heavy Menstrual Control: Tampon, Thin pad, Panty liner Menstrual Control Change Freq (Hours): changes tampon/pad every 2-3 hours on heavy day, on lighter days changing every 4-6 hours Dysmenorrhea: (!) Moderate Dysmenorrhea Symptoms: Cramping  No significant dyspareunia.   Patient's last menstrual period was 09/07/2018 (exact date).          Sexually active: Yes.    The current method of family planning is none.    Exercising: Yes.    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 She has never used smokeless tobacco. She reports current alcohol use. She reports that she does not use drugs. She has rare ETOH. She is working part time from home in [place].  Past Medical History:  Diagnosis Date  . Asthma   . Common  migraine with intractable migraine 06/20/2016  . Herpes   . Migraine without aura   . PCOS (polycystic ovarian syndrome)   . PTSD (post-traumatic stress disorder) 2015  . Sinusitis   . 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 Thyroid disease     Past Surgical History:  Procedure Laterality Date  . COLPOSCOPY    . NASAL SINUS SURGERY  07/2015    Current Outpatient Medications  Medication Sig Dispense Refill  . 5-Hydroxytryptophan (5-HTP) 50 MG CAPS Take 50 mg by mouth.     Marland Kitchen acetaminophen (TYLENOL) 325 MG tablet Take 650 mg by mouth every 6 (six) hours as needed.    . ALPRAZolam (XANAX) 0.25 MG tablet Take 1 tablet (0.25 mg total) by mouth 2 (two) times daily as needed for anxiety. 20 tablet 0  . b complex-C-folic acid 1 MG capsule Take 1 capsule by mouth daily.    . Cholecalciferol (VITAMIN D) 2000 units CAPS Take by mouth.    . fluticasone (FLONASE) 50 MCG/ACT nasal spray Place 1 spray into both nostrils daily. 16 g 3  . ibuprofen (ADVIL,MOTRIN) 200 MG tablet Take 200 mg by mouth every 6 (six) hours as needed.    . Loratadine (CLARITIN PO) Take by mouth daily at 12 noon.    Marland Kitchen PROAIR HFA 108 (90 Base) MCG/ACT inhaler INHALE 2 PUFFS EVERY 6 HOURS AS NEEDED FOR WHEEZE OR SHORTNESS OF BREATH 8.5 Inhaler 1  .  Probiotic Product (PROBIOTIC-10 PO) Take by mouth daily.    . valACYclovir (VALTREX) 500 MG tablet TAKE 1 TABLET BY MOUTH EVERY DAY 90 tablet 1   No current facility-administered medications for this visit.     Family History  Problem Relation Age of Onset  . Migraines Mother   . Aneurysm Mother   . Cancer Maternal Aunt        ovarian  . Cancer Paternal Uncle        colon    Review of Systems  Constitutional: Negative.   HENT: Negative.   Eyes: Negative.   Respiratory: Negative.   Cardiovascular: Negative.   Gastrointestinal: Negative.   Endocrine: Negative.   Genitourinary: Positive for menstrual problem.  Musculoskeletal: Negative.   Skin:  Negative.   Allergic/Immunologic: Negative.   Neurological: Negative.   Hematological: Negative.   Psychiatric/Behavioral: Negative.     Exam:   BP 100/62 (BP Location: Right Arm, Patient Position: Sitting, Cuff Size: Normal)   Pulse 72   Temp (!) 97.1 F (36.2 C) (Skin)   Ht 5\' 6"  (1.676 m)   Wt 137 lb (62.1 kg)   LMP 09/07/2018 (Exact Date)   BMI 22.11 kg/m   Weight change: @WEIGHTCHANGE @ Height:   Height: 5\' 6"  (167.6 cm)  Ht Readings from Last 3 Encounters:  10/19/18 5\' 6"  (1.676 m)  12/24/16 5\' 6"  (1.676 m)  11/20/16 5\' 6"  (1.676 m)    General appearance: alert, cooperative and appears stated age Head: Normocephalic, without obvious abnormality, atraumatic Neck: no adenopathy, supple, symmetrical, trachea midline and thyroid normal to inspection and palpation Lungs: clear to auscultation bilaterally Cardiovascular: regular rate and rhythm Breasts: normal appearance, no masses or tenderness Abdomen: soft, non-tender; non distended,  no masses,  no organomegaly Extremities: extremities normal, atraumatic, no cyanosis or edema Skin: Skin color, texture, turgor normal. No rashes or lesions Lymph nodes: Cervical, supraclavicular, and axillary nodes normal. No abnormal inguinal nodes palpated Neurologic: Grossly normal   Pelvic: External genitalia:  no lesions              Urethra:  normal appearing urethra with no masses, tenderness or lesions              Bartholins and Skenes: normal                 Vagina: normal appearing vagina with normal color and discharge, no lesions              Cervix: no lesions               Bimanual Exam:  Uterus:  normal size, contour, position, consistency, mobility, non-tender and retroverted              Adnexa: no mass, fullness, tenderness               Rectovaginal: Confirms               Anus:  normal sphincter tone, no lesions  Chaperone was present for exam.  A:  Well Woman with normal exam  Oligomenorrhea, long term. Recent  normal TSH  Dense breasts, category D. Discussed that young women typically have denser breasts. Reviewed the density categories. Discussed that dense breasts can make it harder to detect breast cancer and may increase her risk of breast cancer.   P:   Pap with hpv  Discussed and encouraged breast self exam  Discussed calcium and vit D intake  TDAP today  Lipids, CMP, prolactin. Other  labs UTD.   She will get her next mammogram at 64, recommend 3D

## 2018-10-19 NOTE — Patient Instructions (Signed)
EXERCISE AND DIET:  We recommended that you start or continue a regular exercise program for good health. Regular exercise means any activity that makes your heart beat faster and makes you sweat.  We recommend exercising at least 30 minutes per day at least 3 days a week, preferably 4 or 5.  We also recommend a diet low in fat and sugar.  Inactivity, poor dietary choices and obesity can cause diabetes, heart attack, stroke, and kidney damage, among others.    ALCOHOL AND SMOKING:  Women should limit their alcohol intake to no more than 7 drinks/beers/glasses of wine (combined, not each!) per week. Moderation of alcohol intake to this level decreases your risk of breast cancer and liver damage. And of course, no recreational drugs are part of a healthy lifestyle.  And absolutely no smoking or even second hand smoke. Most people know smoking can cause heart and lung diseases, but did you know it also contributes to weakening of your bones? Aging of your skin?  Yellowing of your teeth and nails?  CALCIUM AND VITAMIN D:  Adequate intake of calcium and Vitamin D are recommended.  The recommendations for exact amounts of these supplements seem to change often, but generally speaking 1,000 mg of calcium (between diet and supplement) and 800 units of Vitamin D per day seems prudent. Certain women may benefit from higher intake of Vitamin D.  If you are among these women, your doctor will have told you during your visit.    PAP SMEARS:  Pap smears, to check for cervical cancer or precancers,  have traditionally been done yearly, although recent scientific advances have shown that most women can have pap smears less often.  However, every woman still should have a physical exam from her gynecologist every year. It will include a breast check, inspection of the vulva and vagina to check for abnormal growths or skin changes, a visual exam of the cervix, and then an exam to evaluate the size and shape of the uterus and  ovaries.  And after 38 years of age, a rectal exam is indicated to check for rectal cancers. We will also provide age appropriate advice regarding health maintenance, like when you should have certain vaccines, screening for sexually transmitted diseases, bone density testing, colonoscopy, mammograms, etc.   MAMMOGRAMS:  All women over 40 years old should have a yearly mammogram. Many facilities now offer a "3D" mammogram, which may cost around $50 extra out of pocket. If possible,  we recommend you accept the option to have the 3D mammogram performed.  It both reduces the number of women who will be called back for extra views which then turn out to be normal, and it is better than the routine mammogram at detecting truly abnormal areas.    COLON CANCER SCREENING: Now recommend starting at age 45. At this time colonoscopy is not covered for routine screening until 50. There are take home tests that can be done between 45-49.   COLONOSCOPY:  Colonoscopy to screen for colon cancer is recommended for all women at age 50.  We know, you hate the idea of the prep.  We agree, BUT, having colon cancer and not knowing it is worse!!  Colon cancer so often starts as a polyp that can be seen and removed at colonscopy, which can quite literally save your life!  And if your first colonoscopy is normal and you have no family history of colon cancer, most women don't have to have it again for   10 years.  Once every ten years, you can do something that may end up saving your life, right?  We will be happy to help you get it scheduled when you are ready.  Be sure to check your insurance coverage so you understand how much it will cost.  It may be covered as a preventative service at no cost, but you should check your particular policy.      Breast Self-Awareness Breast self-awareness means being familiar with how your breasts look and feel. It involves checking your breasts regularly and reporting any changes to your  health care provider. Practicing breast self-awareness is important. A change in your breasts can be a sign of a serious medical problem. Being familiar with how your breasts look and feel allows you to find any problems early, when treatment is more likely to be successful. All women should practice breast self-awareness, including women who have had breast implants. How to do a breast self-exam One way to learn what is normal for your breasts and whether your breasts are changing is to do a breast self-exam. To do a breast self-exam: Look for Changes  1. Remove all the clothing above your waist. 2. Stand in front of a mirror in a room with good lighting. 3. Put your hands on your hips. 4. Push your hands firmly downward. 5. Compare your breasts in the mirror. Look for differences between them (asymmetry), such as: ? Differences in shape. ? Differences in size. ? Puckers, dips, and bumps in one breast and not the other. 6. Look at each breast for changes in your skin, such as: ? Redness. ? Scaly areas. 7. Look for changes in your nipples, such as: ? Discharge. ? Bleeding. ? Dimpling. ? Redness. ? A change in position. Feel for Changes Carefully feel your breasts for lumps and changes. It is best to do this while lying on your back on the floor and again while sitting or standing in the shower or tub with soapy water on your skin. Feel each breast in the following way:  Place the arm on the side of the breast you are examining above your head.  Feel your breast with the other hand.  Start in the nipple area and make  inch (2 cm) overlapping circles to feel your breast. Use the pads of your three middle fingers to do this. Apply light pressure, then medium pressure, then firm pressure. The light pressure will allow you to feel the tissue closest to the skin. The medium pressure will allow you to feel the tissue that is a little deeper. The firm pressure will allow you to feel the tissue  close to the ribs.  Continue the overlapping circles, moving downward over the breast until you feel your ribs below your breast.  Move one finger-width toward the center of the body. Continue to use the  inch (2 cm) overlapping circles to feel your breast as you move slowly up toward your collarbone.  Continue the up and down exam using all three pressures until you reach your armpit.  Write Down What You Find  Write down what is normal for each breast and any changes that you find. Keep a written record with breast changes or normal findings for each breast. By writing this information down, you do not need to depend only on memory for size, tenderness, or location. Write down where you are in your menstrual cycle, if you are still menstruating. If you are having trouble noticing differences   in your breasts, do not get discouraged. With time you will become more familiar with the variations in your breasts and more comfortable with the exam. How often should I examine my breasts? Examine your breasts every month. If you are breastfeeding, the best time to examine your breasts is after a feeding or after using a breast pump. If you menstruate, the best time to examine your breasts is 5-7 days after your period is over. During your period, your breasts are lumpier, and it may be more difficult to notice changes. When should I see my health care provider? See your health care provider if you notice:  A change in shape or size of your breasts or nipples.  A change in the skin of your breast or nipples, such as a reddened or scaly area.  Unusual discharge from your nipples.  A lump or thick area that was not there before.  Pain in your breasts.  Anything that concerns you.  

## 2018-10-20 LAB — COMPREHENSIVE METABOLIC PANEL
ALT: 9 IU/L (ref 0–32)
AST: 15 IU/L (ref 0–40)
Albumin/Globulin Ratio: 1.7 (ref 1.2–2.2)
Albumin: 4.6 g/dL (ref 3.8–4.8)
Alkaline Phosphatase: 55 IU/L (ref 39–117)
BUN/Creatinine Ratio: 16 (ref 9–23)
BUN: 12 mg/dL (ref 6–20)
Bilirubin Total: 0.4 mg/dL (ref 0.0–1.2)
CO2: 26 mmol/L (ref 20–29)
Calcium: 9.1 mg/dL (ref 8.7–10.2)
Chloride: 101 mmol/L (ref 96–106)
Creatinine, Ser: 0.74 mg/dL (ref 0.57–1.00)
GFR calc Af Amer: 119 mL/min/{1.73_m2} (ref >59)
GFR calc non Af Amer: 103 mL/min/{1.73_m2} (ref >59)
Globulin, Total: 2.7 g/dL (ref 1.5–4.5)
Glucose: 68 mg/dL (ref 65–99)
Potassium: 4.1 mmol/L (ref 3.5–5.2)
Sodium: 140 mmol/L (ref 134–144)
Total Protein: 7.3 g/dL (ref 6.0–8.5)

## 2018-10-20 LAB — PROLACTIN: Prolactin: 13 ng/mL (ref 4.8–23.3)

## 2018-10-20 LAB — LIPID PANEL
Chol/HDL Ratio: 2.7 ratio (ref 0.0–4.4)
Cholesterol, Total: 153 mg/dL (ref 100–199)
HDL: 56 mg/dL (ref >39)
LDL Chol Calc (NIH): 86 mg/dL (ref 0–99)
Triglycerides: 55 mg/dL (ref 0–149)
VLDL Cholesterol Cal: 11 mg/dL (ref 5–40)

## 2018-10-21 LAB — CYTOLOGY - PAP
Diagnosis: NEGATIVE
HPV: NOT DETECTED

## 2019-01-13 ENCOUNTER — Other Ambulatory Visit: Payer: Self-pay

## 2019-01-14 ENCOUNTER — Encounter: Payer: Self-pay | Admitting: Family Medicine

## 2019-01-14 ENCOUNTER — Ambulatory Visit: Payer: BC Managed Care – PPO | Admitting: Family Medicine

## 2019-01-14 ENCOUNTER — Ambulatory Visit (INDEPENDENT_AMBULATORY_CARE_PROVIDER_SITE_OTHER): Payer: BC Managed Care – PPO

## 2019-01-14 VITALS — BP 100/62 | HR 71 | Temp 97.6°F | Wt 135.4 lb

## 2019-01-14 DIAGNOSIS — G8929 Other chronic pain: Secondary | ICD-10-CM

## 2019-01-14 DIAGNOSIS — M25512 Pain in left shoulder: Secondary | ICD-10-CM | POA: Diagnosis not present

## 2019-01-14 DIAGNOSIS — G43809 Other migraine, not intractable, without status migrainosus: Secondary | ICD-10-CM

## 2019-01-14 DIAGNOSIS — R6884 Jaw pain: Secondary | ICD-10-CM

## 2019-01-14 MED ORDER — CYCLOBENZAPRINE HCL 5 MG PO TABS: 5.0000 mg | ORAL_TABLET | Freq: Three times a day (TID) | ORAL | 1 refills | Status: DC | PRN

## 2019-01-14 NOTE — Patient Instructions (Signed)
Migraine Headache A migraine headache is an intense, throbbing pain on one side or both sides of the head. Migraine headaches may also cause other symptoms, such as nausea, vomiting, and sensitivity to light and noise. A migraine headache can last from 4 hours to 3 days. Talk with your doctor about what things may bring on (trigger) your migraine headaches. What are the causes? The exact cause of this condition is not known. However, a migraine may be caused when nerves in the brain become irritated and release chemicals that cause inflammation of blood vessels. This inflammation causes pain. This condition may be triggered or caused by:  Drinking alcohol.  Smoking.  Taking medicines, such as: ? Medicine used to treat chest pain (nitroglycerin). ? Birth control pills. ? Estrogen. ? Certain blood pressure medicines.  Eating or drinking products that contain nitrates, glutamate, aspartame, or tyramine. Aged cheeses, chocolate, or caffeine may also be triggers.  Doing physical activity. Other things that may trigger a migraine headache include:  Menstruation.  Pregnancy.  Hunger.  Stress.  Lack of sleep or too much sleep.  Weather changes.  Fatigue. What increases the risk? The following factors may make you more likely to experience migraine headaches:  Being a certain age. This condition is more common in people who are 26-74 years old.  Being female.  Having a family history of migraine headaches.  Being Caucasian.  Having a mental health condition, such as depression or anxiety.  Being obese. What are the signs or symptoms? The main symptom of this condition is pulsating or throbbing pain. This pain may:  Happen in any area of the head, such as on one side or both sides.  Interfere with daily activities.  Get worse with physical activity.  Get worse with exposure to bright lights or loud noises. Other symptoms may  include:  Nausea.  Vomiting.  Dizziness.  General sensitivity to bright lights, loud noises, or smells. Before you get a migraine headache, you may get warning signs (an aura). An aura may include:  Seeing flashing lights or having blind spots.  Seeing bright spots, halos, or zigzag lines.  Having tunnel vision or blurred vision.  Having numbness or a tingling feeling.  Having trouble talking.  Having muscle weakness. Some people have symptoms after a migraine headache (postdromal phase), such as:  Feeling tired.  Difficulty concentrating. How is this diagnosed? A migraine headache can be diagnosed based on:  Your symptoms.  A physical exam.  Tests, such as: ? CT scan or an MRI of the head. These imaging tests can help rule out other causes of headaches. ? Taking fluid from the spine (lumbar puncture) and analyzing it (cerebrospinal fluid analysis, or CSF analysis). How is this treated? This condition may be treated with medicines that:  Relieve pain.  Relieve nausea.  Prevent migraine headaches. Treatment for this condition may also include:  Acupuncture.  Lifestyle changes like avoiding foods that trigger migraine headaches.  Biofeedback.  Cognitive behavioral therapy. Follow these instructions at home: Medicines  Take over-the-counter and prescription medicines only as told by your health care provider.  Ask your health care provider if the medicine prescribed to you: ? Requires you to avoid driving or using heavy machinery. ? Can cause constipation. You may need to take these actions to prevent or treat constipation:  Drink enough fluid to keep your urine pale yellow.  Take over-the-counter or prescription medicines.  Eat foods that are high in fiber, such as beans, whole grains, and  fresh fruits and vegetables.  Limit foods that are high in fat and processed sugars, such as fried or sweet foods. Lifestyle  Do not drink alcohol.  Do not  use any products that contain nicotine or tobacco, such as cigarettes, e-cigarettes, and chewing tobacco. If you need help quitting, ask your health care provider.  Get at least 8 hours of sleep every night.  Find ways to manage stress, such as meditation, deep breathing, or yoga. General instructions      Keep a journal to find out what may trigger your migraine headaches. For example, write down: ? What you eat and drink. ? How much sleep you get. ? Any change to your diet or medicines.  If you have a migraine headache: ? Avoid things that make your symptoms worse, such as bright lights. ? It may help to lie down in a dark, quiet room. ? Do not drive or use heavy machinery. ? Ask your health care provider what activities are safe for you while you are experiencing symptoms.  Keep all follow-up visits as told by your health care provider. This is important. Contact a health care provider if:  You develop symptoms that are different or more severe than your usual migraine headache symptoms.  You have more than 15 headache days in one month. Get help right away if:  Your migraine headache becomes severe.  Your migraine headache lasts longer than 72 hours.  You have a fever.  You have a stiff neck.  You have vision loss.  Your muscles feel weak or like you cannot control them.  You start to lose your balance often.  You have trouble walking.  You faint.  You have a seizure. Summary  A migraine headache is an intense, throbbing pain on one side or both sides of the head. Migraines may also cause other symptoms, such as nausea, vomiting, and sensitivity to light and noise.  This condition may be treated with medicines and lifestyle changes. You may also need to avoid certain things that trigger a migraine headache.  Keep a journal to find out what may trigger your migraine headaches.  Contact your health care provider if you have more than 15 headache days in a  month or you develop symptoms that are different or more severe than your usual migraine headache symptoms. This information is not intended to replace advice given to you by your health care provider. Make sure you discuss any questions you have with your health care provider. Document Released: 01/21/2005 Document Revised: 05/15/2018 Document Reviewed: 03/05/2018 Elsevier Patient Education  [place].  Shoulder Pain Many things can cause shoulder pain, including:  An injury to the shoulder.  Overuse of the shoulder.  Arthritis. The source of the pain can be:  Inflammation.  An injury to the shoulder joint.  An injury to a tendon, ligament, or bone. Follow these instructions at home: Pay attention to changes in your symptoms. Let your health care provider know about them. Follow these instructions to relieve your pain. If you have a sling:  Wear the sling as told by your health care provider. Remove it only as told by your health care provider.  Loosen the sling if your fingers tingle, become numb, or turn cold and blue.  Keep the sling clean.  If the sling is not waterproof: ? Do not let it get wet. Remove it to shower or bathe.  Move your arm as little as possible, but keep your hand moving to  prevent swelling. Managing pain, stiffness, and swelling   If directed, put ice on the painful area: ? Put ice in a plastic bag. ? Place a towel between your skin and the bag. ? Leave the ice on for 20 minutes, 2-3 times per day. Stop applying ice if it does not help with the pain.  Squeeze a soft ball or a foam pad as much as possible. This helps to keep the shoulder from swelling. It also helps to strengthen the arm. General instructions  Take over-the-counter and prescription medicines only as told by your health care provider.  Keep all follow-up visits as told by your health care provider. This is important. Contact a health care provider if:  Your pain gets  worse.  Your pain is not relieved with medicines.  New pain develops in your arm, hand, or fingers. Get help right away if:  Your arm, hand, or fingers: ? Tingle. ? Become numb. ? Become swollen. ? Become painful. ? Turn white or blue. Summary  Shoulder pain can be caused by an injury, overuse, or arthritis.  Pay attention to changes in your symptoms. Let your health care provider know about them.  This condition may be treated with a sling, ice, and pain medicines.  Contact your health care provider if the pain gets worse or new pain develops. Get help right away if your arm, hand, or fingers tingle or become numb, swollen, or painful.  Keep all follow-up visits as told by your health care provider. This is important. This information is not intended to replace advice given to you by your health care provider. Make sure you discuss any questions you have with your health care provider. Document Released: 10/31/2004 Document Revised: 08/05/2017 Document Reviewed: 08/05/2017 Elsevier Patient Education  [place].  Jaw Range of Motion Exercises Jaw range of motion exercises are exercises that help your jaw move better. Exercises that help you have good posture (postural exercises) also help relieve jaw discomfort. These are often done along with range of motion exercises. These exercises can help prevent or improve:  Difficulty opening your mouth.  Pain in your jaw while it is open or closed.  Temporomandibular joint (TMJ) pain.  Headache caused by jaw tension. Take other actions to prevent or relieve jaw pain, such as:  Avoiding things that cause or increase jaw pain. This may include: ? Chewing gum or eating hard foods. ? Clenching your jaw or teeth, grinding your teeth, or keeping tension in your jaw muscles. ? Opening your mouth wide, such as for a big yawn. ? Leaning on your jaw, such as resting your jaw in your hand while leaning on a desk.  Putting ice on  your jaw. ? Put ice in a plastic bag. ? Place a towel between your skin and the bag. ? Leave the ice on for 10-15 minutes, 2-3 times a day. Only do jaw exercises that your health care provider approves of. Only move your jaw as far as it can comfortably go in each direction. Do not move your jaw into positions that cause pain. Range of motion exercises Repeat each of these exercises 8 times, 1-2 times a day, or as told by your health care provider. Exercise A: Forward protrusion 1. Push your jaw forward. Hold this position for 1-2 seconds. 2. Allow your jaw to return to its normal position and rest it there for 1-2 seconds. Exercise B: Controlled opening 1. Stand or sit in front of a mirror. Place your  tongue on the roof of your mouth, just behind your top teeth. 2. Keeping your tongue on the roof of your mouth, slowly open and close your mouth. 3. While you open and close your mouth, watch your jaw in the mirror. Try to keep your jaw from moving to one side or the other. Exercise C: Right and left motion 1. Move your jaw right. Hold this position for 1-2 seconds. Allow your jaw to return to its normal position, and rest it there for 1-2 seconds. 2. Move your jaw left. Hold this position for 1-2 seconds. Allow your jaw to return to its normal position, and rest it there for 1-2 seconds. Postural exercises Exercise A: Chin tucks 1. You can do this exercise sitting, standing, or lying down. 2. Move your head straight back, keeping your head level. You can guide the movement by placing your fingers on your chin to push your jaw back in an even motion. You should be able to feel a double chin form at the end of the motion. 3. Hold this position for 5 seconds. Repeat 10-15 times. Exercise B: Shoulder blade squeeze 1. Sit or stand. 2. Bend your elbows to about 90 degrees, which is the shape of a capital letter "L." Keep your upper arms by your body. 3. Squeeze your shoulder blades down and back,  as though you were trying to touch your elbows behind you. Do not shrug your shoulders or move your head. 4. Hold this position for 5 seconds. Repeat 10-15 times. Exercise C: Chest stretch 1. Stand facing a corner. 2. Put both of your hands and your forearms on the wall, with your arms wide apart. 3. Make sure your arms are at a 90-degree angle to your body. This means that you should hold your arms straight out from your body, level with the floor. 4. Step in toward the corner. Do not lean in. 5. Hold this position for 30 seconds. Repeat 3 times. Contact a health care provider if you have:  Jaw pain that is new or gets worse.  Clicking or popping sounds while doing the exercises. Get help right away if:  Your jaw is stuck in one place and you cannot move it.  You cannot open or close your mouth. This information is not intended to replace advice given to you by your health care provider. Make sure you discuss any questions you have with your health care provider. Document Released: 01/04/2008 Document Revised: 05/15/2018 Document Reviewed: 12/18/2016 Elsevier Patient Education  2020 [place] is a normal reaction to life events. Stress is what you feel when life demands more than you are used to, or more than you think you can handle. Some stress can be useful, such as studying for a test or meeting a deadline at work. Stress that occurs too often or for too long can cause problems. It can affect your emotional health and interfere with relationships and normal daily activities. Too much stress can weaken your body's defense system (immune system) and increase your risk for physical illness. If you already have a medical problem, stress can make it worse. What are the causes? All sorts of life events can cause stress. An event that causes stress for one person may not be stressful for another person. Major life events, whether positive or negative, commonly cause stress.  Examples include:  Losing a job or starting a new job.  Losing a loved one.  Moving to a new town or  home.  Getting married or divorced.  Having a baby.  Injury or illness. Less obvious life events can also cause stress, especially if they occur day after day or in combination with each other. Examples include:  Working long hours.  Driving in traffic.  Caring for children.  Being in debt.  Being in a difficult relationship. What are the signs or symptoms? Stress can cause emotional symptoms, including:  Anxiety. This is feeling worried, afraid, on edge, overwhelmed, or out of control.  Anger, including irritation or impatience.  Depression. This is feeling sad, down, helpless, or guilty.  Trouble focusing, remembering, or making decisions. Stress can cause physical symptoms, including:  Aches and pains. These may affect your head, neck, back, stomach, or other areas of your body.  Tight muscles or a clenched jaw.  Low energy.  Trouble sleeping. Stress can cause unhealthy behaviors, including:  Eating to feel better (overeating) or skipping meals.  Working too much or putting off tasks.  Smoking, drinking alcohol, or using drugs to feel better. How is this diagnosed? Stress is diagnosed through an assessment by your health care provider. He or she may diagnose this condition based on:  Your symptoms and any stressful life events.  Your medical history.  Tests to rule out other causes of your symptoms. Depending on your condition, your health care provider may refer you to a specialist for further evaluation. How is this treated?  Stress management techniques are the recommended treatment for stress. Medicine is not typically recommended for the treatment of stress. Techniques to reduce your reaction to stressful life events include:  Stress identification. Monitor yourself for symptoms of stress and identify what causes stress for you. These skills may  help you to avoid or prepare for stressful events.  Time management. Set your priorities, keep a calendar of events, and learn to say no. Taking these actions can help you avoid making too many commitments. Techniques for coping with stress include:  Rethinking the problem. Try to think realistically about stressful events rather than ignoring them or overreacting. Try to find the positives in a stressful situation rather than focusing on the negatives.  Exercise. Physical exercise can release both physical and emotional tension. The key is to find a form of exercise that you enjoy and do it regularly.  Relaxation techniques. These relax the body and mind. The key is to find one or more that you enjoy and use the technique(s) regularly. Examples include: ? Meditation, deep breathing, or progressive relaxation techniques. ? Yoga or tai chi. ? Biofeedback, mindfulness techniques, or journaling. ? Listening to music, being out in nature, or participating in other hobbies.  Practicing a healthy lifestyle. Eat a balanced diet, drink plenty of water, limit or avoid caffeine, and get plenty of sleep.  Having a strong support network. Spend time with family, friends, or other people you enjoy being around. Express your feelings and talk things over with someone you trust. Counseling or talk therapy with a mental health professional may be helpful if you are having trouble managing stress on your own. Follow these instructions at home: Lifestyle   Avoid drugs.  Do not use any products that contain nicotine or tobacco, such as cigarettes and e-cigarettes. If you need help quitting, ask your health care provider.  Limit alcohol intake to no more than 1 drink a day for nonpregnant women and 2 drinks a day for men. One drink equals 12 oz of beer, 5 oz of wine, or 1 oz  of hard liquor.  Do not use alcohol or drugs to relax.  Eat a balanced diet that includes fresh fruits and vegetables, whole  grains, lean meats, fish, eggs, and beans, and low-fat dairy. Avoid processed foods and foods high in added fat, sugar, and salt.  Exercise at least 30 minutes on 5 or more days each week.  Get 7-8 hours of sleep each night. General instructions   Practice stress management techniques as discussed with your health care provider.  Drink enough fluid to keep your urine clear or pale yellow.  Take over-the-counter and prescription medicines only as told by your health care provider.  Keep all follow-up visits as told by your health care provider. This is important. Contact a health care provider if:  Your symptoms get worse.  You have new symptoms.  You feel overwhelmed by your problems and can no longer manage them on your own. Get help right away if:  You have thoughts of hurting yourself or others. If you ever feel like you may hurt yourself or others, or have thoughts about taking your own life, get help right away. You can go to your nearest emergency department or call:  Your local emergency services (911 in the [place]  A suicide crisis helpline, such as the [facility] at (506)235-7137. This is open 24 hours a day. Summary  Stress is a normal reaction to life events. It can cause problems if it happens too often or for too long.  Practicing stress management techniques is the best way to treat stress.  Counseling or talk therapy with a mental health professional may be helpful if you are having trouble managing stress on your own. This information is not intended to replace advice given to you by your health care provider. Make sure you discuss any questions you have with your health care provider. Document Released: 07/17/2000 Document Revised: 01/03/2017 Document Reviewed: 03/13/2016 Elsevier Patient Education  2020 [employer].

## 2019-01-14 NOTE — Progress Notes (Signed)
miSubjective:    Patient ID: Elizabeth Contreras, female    DOB: 1980/03/26, 38 y.o.   MRN: [id number]  Chief Complaint  Patient presents with  . Headache    Pt states that her headaches over the last month have become more constant and counted 18 days within the last month     HPI Patient was seen today for ongoing concern.  Pt with increased HAs over the last few months and daily L jaw pain that will cause a HA.  HAs are L sided, behind eye and at L temple.  Pt endorses sensitivity to sound> light, and nausea.  This month alone pt with 8 days straight of jaw pain and 2 HAs.  Taking Ibuprofen prn.  Pt increased magnesium to help with HAs, but it caused nausea and heart flutters.  In the past pt seen by Dr. Jannifer Franklin, neuro.  Unable to tolerate Topamax,  rx'd nortriptyline but never started.  Pt has an appt with HA specialist in January.  Pt notes increased stress at work and from caring for her husband who had complications after knee replacement surgery.  Pt doing yoga BID.  Pt also with continued L shoulder issues, states "it doesn't feel right when moved in certain directions.  Feels like it needs to pop".  Pt notes a pain/tension in shoulder with soreness in the proximal humerus.  Started several months ago.  At times hears a pop or click.  Past Medical History:  Diagnosis Date  . Asthma   . Common migraine with intractable migraine 06/20/2016  . Herpes   . Migraine without aura   . PCOS (polycystic ovarian syndrome)   . PTSD (post-traumatic stress disorder) 2015  . Sinusitis   . 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 Thyroid disease     Allergies  Allergen Reactions  . Topamax [Topiramate]     Cognitive slowing    ROS General: Denies fever, chills, night sweats, changes in weight, changes in appetite HEENT: Denies headaches, ear pain, changes in vision, rhinorrhea, sore throat  +migriane CV: Denies CP, palpitations, SOB, orthopnea Pulm: Denies SOB, cough,  wheezing GI: Denies abdominal pain, vomiting, diarrhea, constipation  + nausea GU: Denies dysuria, hematuria, frequency, vaginal discharge Msk: Denies muscle cramps, joint pains  + left shoulder pain Neuro: Denies weakness, numbness, tingling Skin: Denies rashes, bruising Psych: Denies depression, anxiety, hallucinations    Objective:    Blood pressure 100/62, pulse 71, temperature 97.6 F (36.4 C), temperature source Temporal, weight 135 lb 6.4 oz (61.4 kg), SpO2 98 %.   Gen. Pleasant, well-nourished, in no distress, normal affect   HEENT: [place]/AT, face symmetric, no scleral icterus, PERRLA, EOMI, nares patent without drainage Lungs: no accessory muscle use, CTAB, no wheezes or rales Cardiovascular: RRR, no m/r/g, no peripheral edema Musculoskeletal: Tension in L shoulder.  B/l shoulders symmetric.  TTP of greater tuburcle proximal humerus. Normal ROM, and scapular movement.  +cross arm, pain with empty can test but arm does not drop.  Negative lift off, Neer's and apprehension.   No deformities, no cyanosis or clubbing, normal tone Neuro:  A&Ox3, CN II-XII intact, normal gait Skin:  Warm, no lesions/ rash  Wt Readings from Last 3 Encounters:  01/14/19 135 lb 6.4 oz (61.4 kg)  10/19/18 137 lb (62.1 kg)  07/15/18 135 lb (61.2 kg)    Lab Results  Component Value Date   WBC 9.0 03/09/2018   HGB 13.4 03/09/2018   HCT 40.6  03/09/2018   PLT 190.0 03/09/2018   GLUCOSE 68 10/19/2018   CHOL 153 10/19/2018   TRIG 55 10/19/2018   HDL 56 10/19/2018   LDLCALC 86 10/19/2018   ALT 9 10/19/2018   AST 15 10/19/2018   NA 140 10/19/2018   K 4.1 10/19/2018   CL 101 10/19/2018   CREATININE 0.74 10/19/2018   BUN 12 10/19/2018   CO2 26 10/19/2018   TSH 3.45 03/09/2018    Assessment/Plan:  Other migraine without status migrainosus, not intractable  -muscle tension likely contributing to HAs. -discussed ways to reduce stress -will try flexeril at night to relieve tension in neck and L  shoulder.  If tolerated pt can take during the day prn -ok to use ibuprofen prn.  Avoid frequent use to prevent rebound HAs -in the past did not tolerate propranolol, topamax.   -encouraged to keep f/u appt with HA specialist in Jan. -consider trigger point injections in shoulder and nortriptyline for continued symptoms. - Plan: cyclobenzaprine (FLEXERIL) 5 MG tablet, CANCELED: POCT urine pregnancy  Jaw pain  -clenching jaw/TMJ likely contributing to HAs -discussed ways to relieve stress and tension -consider mouth guard at night - Plan: cyclobenzaprine (FLEXERIL) 5 MG tablet  Chronic left shoulder pain  -consider muscle strain, arthritis, rotator cuff injury -discussed supportive care: Tylenol, stretching, heat, OTC analgesic rubs, massage -consider PT and trigger point injections. - Plan: DG Shoulder Left, cyclobenzaprine (FLEXERIL) 5 MG tablet, DG Shoulder Left  F/u prn in the next few wks.  Grier Mitts, MD  This note is not being shared with the patient for the following reason: To prevent harm (release of this note would result in harm to the life or physical safety of the patient or another).

## 2019-01-18 DIAGNOSIS — M9902 Segmental and somatic dysfunction of thoracic region: Secondary | ICD-10-CM | POA: Diagnosis not present

## 2019-01-18 DIAGNOSIS — R519 Headache, unspecified: Secondary | ICD-10-CM | POA: Diagnosis not present

## 2019-01-18 DIAGNOSIS — M26619 Adhesions and ankylosis of temporomandibular joint, unspecified side: Secondary | ICD-10-CM | POA: Diagnosis not present

## 2019-01-18 DIAGNOSIS — M9901 Segmental and somatic dysfunction of cervical region: Secondary | ICD-10-CM | POA: Diagnosis not present

## 2019-01-19 DIAGNOSIS — M9901 Segmental and somatic dysfunction of cervical region: Secondary | ICD-10-CM | POA: Diagnosis not present

## 2019-01-19 DIAGNOSIS — M26619 Adhesions and ankylosis of temporomandibular joint, unspecified side: Secondary | ICD-10-CM | POA: Diagnosis not present

## 2019-01-19 DIAGNOSIS — R519 Headache, unspecified: Secondary | ICD-10-CM | POA: Diagnosis not present

## 2019-01-19 DIAGNOSIS — M9902 Segmental and somatic dysfunction of thoracic region: Secondary | ICD-10-CM | POA: Diagnosis not present

## 2019-01-22 DIAGNOSIS — M26619 Adhesions and ankylosis of temporomandibular joint, unspecified side: Secondary | ICD-10-CM | POA: Diagnosis not present

## 2019-01-22 DIAGNOSIS — M9902 Segmental and somatic dysfunction of thoracic region: Secondary | ICD-10-CM | POA: Diagnosis not present

## 2019-01-22 DIAGNOSIS — R519 Headache, unspecified: Secondary | ICD-10-CM | POA: Diagnosis not present

## 2019-01-22 DIAGNOSIS — M9901 Segmental and somatic dysfunction of cervical region: Secondary | ICD-10-CM | POA: Diagnosis not present

## 2019-02-01 DIAGNOSIS — M9902 Segmental and somatic dysfunction of thoracic region: Secondary | ICD-10-CM | POA: Diagnosis not present

## 2019-02-01 DIAGNOSIS — M26619 Adhesions and ankylosis of temporomandibular joint, unspecified side: Secondary | ICD-10-CM | POA: Diagnosis not present

## 2019-02-01 DIAGNOSIS — R519 Headache, unspecified: Secondary | ICD-10-CM | POA: Diagnosis not present

## 2019-02-01 DIAGNOSIS — M9901 Segmental and somatic dysfunction of cervical region: Secondary | ICD-10-CM | POA: Diagnosis not present

## 2019-02-03 DIAGNOSIS — M9902 Segmental and somatic dysfunction of thoracic region: Secondary | ICD-10-CM | POA: Diagnosis not present

## 2019-02-03 DIAGNOSIS — M9901 Segmental and somatic dysfunction of cervical region: Secondary | ICD-10-CM | POA: Diagnosis not present

## 2019-02-03 DIAGNOSIS — M26619 Adhesions and ankylosis of temporomandibular joint, unspecified side: Secondary | ICD-10-CM | POA: Diagnosis not present

## 2019-02-03 DIAGNOSIS — R519 Headache, unspecified: Secondary | ICD-10-CM | POA: Diagnosis not present

## 2019-02-10 ENCOUNTER — Other Ambulatory Visit: Payer: Self-pay | Admitting: Family Medicine

## 2019-02-10 DIAGNOSIS — M9902 Segmental and somatic dysfunction of thoracic region: Secondary | ICD-10-CM | POA: Diagnosis not present

## 2019-02-10 DIAGNOSIS — R519 Headache, unspecified: Secondary | ICD-10-CM | POA: Diagnosis not present

## 2019-02-10 DIAGNOSIS — M26619 Adhesions and ankylosis of temporomandibular joint, unspecified side: Secondary | ICD-10-CM | POA: Diagnosis not present

## 2019-02-10 DIAGNOSIS — B009 Herpesviral infection, unspecified: Secondary | ICD-10-CM

## 2019-02-10 DIAGNOSIS — M9901 Segmental and somatic dysfunction of cervical region: Secondary | ICD-10-CM | POA: Diagnosis not present

## 2019-02-17 DIAGNOSIS — R519 Headache, unspecified: Secondary | ICD-10-CM | POA: Diagnosis not present

## 2019-02-17 DIAGNOSIS — M26619 Adhesions and ankylosis of temporomandibular joint, unspecified side: Secondary | ICD-10-CM | POA: Diagnosis not present

## 2019-02-17 DIAGNOSIS — M9901 Segmental and somatic dysfunction of cervical region: Secondary | ICD-10-CM | POA: Diagnosis not present

## 2019-02-17 DIAGNOSIS — M9902 Segmental and somatic dysfunction of thoracic region: Secondary | ICD-10-CM | POA: Diagnosis not present

## 2019-02-23 ENCOUNTER — Encounter: Payer: Self-pay | Admitting: Family Medicine

## 2019-02-24 DIAGNOSIS — M26619 Adhesions and ankylosis of temporomandibular joint, unspecified side: Secondary | ICD-10-CM | POA: Diagnosis not present

## 2019-02-24 DIAGNOSIS — M9901 Segmental and somatic dysfunction of cervical region: Secondary | ICD-10-CM | POA: Diagnosis not present

## 2019-02-24 DIAGNOSIS — M9902 Segmental and somatic dysfunction of thoracic region: Secondary | ICD-10-CM | POA: Diagnosis not present

## 2019-02-24 DIAGNOSIS — R519 Headache, unspecified: Secondary | ICD-10-CM | POA: Diagnosis not present

## 2019-02-26 ENCOUNTER — Other Ambulatory Visit: Payer: Self-pay | Admitting: Family Medicine

## 2019-02-26 DIAGNOSIS — G8929 Other chronic pain: Secondary | ICD-10-CM

## 2019-02-26 DIAGNOSIS — M25512 Pain in left shoulder: Secondary | ICD-10-CM

## 2019-02-26 DIAGNOSIS — F419 Anxiety disorder, unspecified: Secondary | ICD-10-CM | POA: Diagnosis not present

## 2019-02-26 DIAGNOSIS — F4310 Post-traumatic stress disorder, unspecified: Secondary | ICD-10-CM | POA: Insufficient documentation

## 2019-02-26 DIAGNOSIS — G43009 Migraine without aura, not intractable, without status migrainosus: Secondary | ICD-10-CM | POA: Diagnosis not present

## 2019-03-03 DIAGNOSIS — M9901 Segmental and somatic dysfunction of cervical region: Secondary | ICD-10-CM | POA: Diagnosis not present

## 2019-03-03 DIAGNOSIS — M9902 Segmental and somatic dysfunction of thoracic region: Secondary | ICD-10-CM | POA: Diagnosis not present

## 2019-03-03 DIAGNOSIS — M26619 Adhesions and ankylosis of temporomandibular joint, unspecified side: Secondary | ICD-10-CM | POA: Diagnosis not present

## 2019-03-03 DIAGNOSIS — R519 Headache, unspecified: Secondary | ICD-10-CM | POA: Diagnosis not present

## 2019-03-09 ENCOUNTER — Ambulatory Visit: Payer: BC Managed Care – PPO | Admitting: Orthopaedic Surgery

## 2019-03-09 ENCOUNTER — Other Ambulatory Visit: Payer: Self-pay

## 2019-03-09 ENCOUNTER — Encounter: Payer: Self-pay | Admitting: Orthopaedic Surgery

## 2019-03-09 DIAGNOSIS — M25512 Pain in left shoulder: Secondary | ICD-10-CM

## 2019-03-09 DIAGNOSIS — G8929 Other chronic pain: Secondary | ICD-10-CM

## 2019-03-09 MED ORDER — DICLOFENAC SODIUM 75 MG PO TBEC: 75.0000 mg | DELAYED_RELEASE_TABLET | Freq: Two times a day (BID) | ORAL | 2 refills | Status: DC

## 2019-03-09 NOTE — Progress Notes (Signed)
Office Visit Note   Patient: Elizabeth Contreras           Date of Birth: 01/15/1981           MRN: [id number] Visit Date: 03/09/2019              Requested by: Billie Ruddy, MD [place],  [place] [id number] PCP: Billie Ruddy, MD   Assessment & Plan: Visit Diagnoses:  1. Chronic left shoulder pain     Plan: Impression is chronic left shoulder pain for the last year.  I suspect that it is either adhesive capsulitis or biceps tendinitis.  Overall her pain is at a 1-2 out of 10.  Reassurance was provided that she likely does not have anything surgical regarding her shoulder.  I would recommend relative rest from certain activities that we discussed.  Based on our discussion of treatment options we will go ahead and try 2 weeks of scheduled oral diclofenac then as needed.  If no relief we may consider a steroid taper.  Questions encouraged and answered.  Follow-up as needed.  Follow-Up Instructions: Return if symptoms worsen or fail to improve.   Orders:  No orders of the defined types were placed in this encounter.  Meds ordered this encounter  Medications  . diclofenac (VOLTAREN) 75 MG EC tablet    Sig: Take 1 tablet (75 mg total) by mouth 2 (two) times daily.    Dispense:  30 tablet    Refill:  2      Procedures: No procedures performed   Clinical Data: No additional findings.   Subjective: Chief Complaint  Patient presents with  . Left Shoulder - Pain    Elizabeth Contreras is a 39 year old female who was a classmate of mine from high school comes in for evaluation of chronic left shoulder pain.  She is a referral from her PCP Dr.Banks for evaluation of chronic left shoulder pain without definite injury.  She feels a pinching pain that is slightly localized to the anterior aspect.  Certain movements and activity do elicit the pain.  She feels like the onset was gradual.  Denies any numbness and tingling in her hands.  She does feel some discomfort with  sleeping on the left shoulder.  Denies any previous shoulder surgery.  She has been seeing a chiropractor for this but this does not help.  She also notices some popping.  She has not tried any over-the-counter medications.  Denies a history of diabetes or hypothyroidism   Review of Systems  Constitutional: Negative.   HENT: Negative.   Eyes: Negative.   Respiratory: Negative.   Cardiovascular: Negative.   Endocrine: Negative.   Musculoskeletal: Negative.   Neurological: Negative.   Hematological: Negative.   Psychiatric/Behavioral: Negative.   All other systems reviewed and are negative.    Objective: Vital Signs: There were no vitals taken for this visit.  Physical Exam Vitals and nursing note reviewed.  Constitutional:      Appearance: She is well-developed.  HENT:     Head: Normocephalic and atraumatic.  Pulmonary:     Effort: Pulmonary effort is normal.  Abdominal:     Palpations: Abdomen is soft.  Musculoskeletal:     Cervical back: Neck supple.  Skin:    General: Skin is warm.     Capillary Refill: Capillary refill takes less than 2 seconds.  Neurological:     Mental Status: She is alert and oriented to person, place, and time.  Psychiatric:        Behavior: Behavior normal.        Thought Content: Thought content normal.        Judgment: Judgment normal.     Ortho Exam Left shoulder exam shows no swelling or masses or lesions.  She has slight decreased external rotation and internal rotation.  Her pain localizes to the anterior aspect.  Mild discomfort with palpation of the biceps tendon.  Mild pain with Speed test.  Rotator cuff is normal to manual muscle testing.  Specialty Comments:  No specialty comments available.  Imaging: No results found.   PMFS History: Patient Active Problem List   Diagnosis Date Noted  . Chronic left shoulder pain 03/09/2019  . Subclinical hypothyroidism 11/21/2016  . Vitamin D deficiency 11/21/2016  . Common migraine  with intractable migraine 06/20/2016   Past Medical History:  Diagnosis Date  . Asthma   . Common migraine with intractable migraine 06/20/2016  . Herpes   . Migraine without aura   . PCOS (polycystic ovarian syndrome)   . PTSD (post-traumatic stress disorder) 2015  . Sinusitis   . 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 Thyroid disease     Family History  Problem Relation Age of Onset  . Migraines Mother   . Aneurysm Mother   . Cancer Maternal Aunt        ovarian  . Cancer Paternal Uncle        colon    Past Surgical History:  Procedure Laterality Date  . COLPOSCOPY    . NASAL SINUS SURGERY  07/2015   Social History   Occupational History    Comment: unemployed  Tobacco Use  . Smoking status: Never Smoker  . Smokeless tobacco: Never Used  Substance and Sexual Activity  . Alcohol use: Yes    Comment: rarely  . Drug use: No  . Sexual activity: Yes    Partners: Male    Birth control/protection: None

## 2019-03-17 DIAGNOSIS — M26619 Adhesions and ankylosis of temporomandibular joint, unspecified side: Secondary | ICD-10-CM | POA: Diagnosis not present

## 2019-03-17 DIAGNOSIS — R519 Headache, unspecified: Secondary | ICD-10-CM | POA: Diagnosis not present

## 2019-03-17 DIAGNOSIS — M9901 Segmental and somatic dysfunction of cervical region: Secondary | ICD-10-CM | POA: Diagnosis not present

## 2019-03-17 DIAGNOSIS — M9902 Segmental and somatic dysfunction of thoracic region: Secondary | ICD-10-CM | POA: Diagnosis not present

## 2019-03-31 DIAGNOSIS — M9902 Segmental and somatic dysfunction of thoracic region: Secondary | ICD-10-CM | POA: Diagnosis not present

## 2019-03-31 DIAGNOSIS — M26619 Adhesions and ankylosis of temporomandibular joint, unspecified side: Secondary | ICD-10-CM | POA: Diagnosis not present

## 2019-03-31 DIAGNOSIS — R519 Headache, unspecified: Secondary | ICD-10-CM | POA: Diagnosis not present

## 2019-03-31 DIAGNOSIS — M9901 Segmental and somatic dysfunction of cervical region: Secondary | ICD-10-CM | POA: Diagnosis not present

## 2019-04-07 DIAGNOSIS — M26619 Adhesions and ankylosis of temporomandibular joint, unspecified side: Secondary | ICD-10-CM | POA: Diagnosis not present

## 2019-04-07 DIAGNOSIS — M9902 Segmental and somatic dysfunction of thoracic region: Secondary | ICD-10-CM | POA: Diagnosis not present

## 2019-04-07 DIAGNOSIS — M9901 Segmental and somatic dysfunction of cervical region: Secondary | ICD-10-CM | POA: Diagnosis not present

## 2019-04-07 DIAGNOSIS — R519 Headache, unspecified: Secondary | ICD-10-CM | POA: Diagnosis not present

## 2019-04-19 DIAGNOSIS — M26619 Adhesions and ankylosis of temporomandibular joint, unspecified side: Secondary | ICD-10-CM | POA: Diagnosis not present

## 2019-04-19 DIAGNOSIS — M9901 Segmental and somatic dysfunction of cervical region: Secondary | ICD-10-CM | POA: Diagnosis not present

## 2019-04-19 DIAGNOSIS — R519 Headache, unspecified: Secondary | ICD-10-CM | POA: Diagnosis not present

## 2019-04-19 DIAGNOSIS — M9902 Segmental and somatic dysfunction of thoracic region: Secondary | ICD-10-CM | POA: Diagnosis not present

## 2019-05-11 ENCOUNTER — Other Ambulatory Visit: Payer: Self-pay

## 2019-05-11 ENCOUNTER — Ambulatory Visit (INDEPENDENT_AMBULATORY_CARE_PROVIDER_SITE_OTHER): Payer: Self-pay | Admitting: Orthopaedic Surgery

## 2019-05-11 ENCOUNTER — Encounter: Payer: Self-pay | Admitting: Orthopaedic Surgery

## 2019-05-11 DIAGNOSIS — M25512 Pain in left shoulder: Secondary | ICD-10-CM

## 2019-05-11 DIAGNOSIS — G8929 Other chronic pain: Secondary | ICD-10-CM

## 2019-05-11 NOTE — Progress Notes (Signed)
   Office Visit Note   Patient: Elizabeth Contreras           Date of Birth: [birth year]           MRN: [id number] Visit Date: 05/11/2019              Requested by: Billie Ruddy, MD [place],  [place] [id number] PCP: Billie Ruddy, MD   Assessment & Plan: Visit Diagnoses:  1. Chronic left shoulder pain     Plan: Given the lack of improvement with conservative treatment and after a thorough discussion of treatment options including glenohumeral cortisone injection versus an MR arthrogram the patient has elected to proceed with advanced imaging first to rule out structural abnormalities.  We will see her back after the MRI.  Follow-Up Instructions: Return in about 2 weeks (around 05/25/2019).   Orders:  No orders of the defined types were placed in this encounter.  No orders of the defined types were placed in this encounter.     Procedures: No procedures performed   Clinical Data: No additional findings.   Subjective: Chief Complaint  Patient presents with  . Left Shoulder - Pain    Elizabeth Contreras returns today for continued left shoulder pain.  I saw her about 2 months ago for this issue and this has not improved.  She stated the 2 weeks of diclofenac did not provide any relief.   Review of Systems   Objective: Vital Signs: There were no vitals taken for this visit.  Physical Exam  Ortho Exam Left shoulder shows mild restriction in range of motion with flexion abduction external rotation and internal rotation.  Manual muscle testing is slightly weak secondary to pain.  Bicipital groove is tender to palpation.  Positive Speed test. Specialty Comments:  No specialty comments available.  Imaging: No results found.   PMFS History: Patient Active Problem List   Diagnosis Date Noted  . Chronic left shoulder pain 03/09/2019  . Subclinical hypothyroidism 11/21/2016  . Vitamin D deficiency 11/21/2016  . Common migraine with intractable  migraine 06/20/2016   Past Medical History:  Diagnosis Date  . Asthma   . Common migraine with intractable migraine 06/20/2016  . Herpes   . Migraine without aura   . PCOS (polycystic ovarian syndrome)   . PTSD (post-traumatic stress disorder) 2015  . Sinusitis   . 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 Thyroid disease     Family History  Problem Relation Age of Onset  . Migraines Mother   . Aneurysm Mother   . Cancer Maternal Aunt        ovarian  . Cancer Paternal Uncle        colon    Past Surgical History:  Procedure Laterality Date  . COLPOSCOPY    . NASAL SINUS SURGERY  07/2015   Social History   Occupational History    Comment: unemployed  Tobacco Use  . Smoking status: Never Smoker  . Smokeless tobacco: Never Used  Substance and Sexual Activity  . Alcohol use: Yes    Comment: rarely  . Drug use: No  . Sexual activity: Yes    Partners: Male    Birth control/protection: None

## 2019-05-12 NOTE — Addendum Note (Signed)
Addended by: Precious Bard on: 05/12/2019 04:16 PM   Modules accepted: Orders

## 2019-05-21 ENCOUNTER — Other Ambulatory Visit: Payer: Self-pay | Admitting: Family Medicine

## 2019-05-21 DIAGNOSIS — M25512 Pain in left shoulder: Secondary | ICD-10-CM

## 2019-05-21 DIAGNOSIS — G8929 Other chronic pain: Secondary | ICD-10-CM

## 2019-05-21 DIAGNOSIS — G43809 Other migraine, not intractable, without status migrainosus: Secondary | ICD-10-CM

## 2019-05-21 DIAGNOSIS — R6884 Jaw pain: Secondary | ICD-10-CM

## 2019-05-21 NOTE — Telephone Encounter (Signed)
Pt LOV and Last refill was on 01/14/2019 given 30 tablets and 1 refill, please advise if ok to send refill

## 2019-05-25 ENCOUNTER — Ambulatory Visit: Payer: Self-pay | Admitting: Orthopaedic Surgery

## 2019-05-25 DIAGNOSIS — F419 Anxiety disorder, unspecified: Secondary | ICD-10-CM | POA: Diagnosis not present

## 2019-05-25 DIAGNOSIS — G43009 Migraine without aura, not intractable, without status migrainosus: Secondary | ICD-10-CM | POA: Diagnosis not present

## 2019-05-25 DIAGNOSIS — F4310 Post-traumatic stress disorder, unspecified: Secondary | ICD-10-CM | POA: Diagnosis not present

## 2019-06-02 ENCOUNTER — Other Ambulatory Visit: Payer: Self-pay

## 2019-06-02 ENCOUNTER — Ambulatory Visit
Admission: RE | Admit: 2019-06-02 | Discharge: 2019-06-02 | Disposition: A | Discharge status: Home / Self Care | Payer: BC Managed Care – PPO | Source: Ambulatory Visit | Attending: Orthopaedic Surgery | Admitting: Orthopaedic Surgery

## 2019-06-02 DIAGNOSIS — G8929 Other chronic pain: Secondary | ICD-10-CM

## 2019-06-02 DIAGNOSIS — M25512 Pain in left shoulder: Secondary | ICD-10-CM

## 2019-06-02 MED ORDER — IOPAMIDOL (ISOVUE-M 200) INJECTION 41%
15.0000 mL | Freq: Once | INTRAMUSCULAR | Status: AC
  Administered 2019-06-02: 15 mL via INTRA_ARTICULAR

## 2019-06-04 ENCOUNTER — Other Ambulatory Visit: Payer: Self-pay

## 2019-06-04 ENCOUNTER — Ambulatory Visit: Payer: BC Managed Care – PPO | Admitting: Orthopaedic Surgery

## 2019-06-04 ENCOUNTER — Encounter: Payer: Self-pay | Admitting: Orthopaedic Surgery

## 2019-06-04 VITALS — Ht 66.0 in | Wt 135.0 lb

## 2019-06-04 DIAGNOSIS — S43432A Superior glenoid labrum lesion of left shoulder, initial encounter: Secondary | ICD-10-CM | POA: Diagnosis not present

## 2019-06-04 NOTE — Progress Notes (Signed)
   Office Visit Note   Patient: Elizabeth Contreras           Date of Birth: 1981/01/25           MRN: [id number] Visit Date: 06/04/2019              Requested by: Billie Ruddy, MD [place],  [place] [id number] PCP: Billie Ruddy, MD   Assessment & Plan: Visit Diagnoses:  1. Superior labrum anterior-to-posterior (SLAP) tear of left shoulder     Plan: MR arthrogram shows a SLAP tear with associated para labral cyst extending into the spinal glenoid notch.  There are some erosion of the scapular neck.  No denervation or muscle atrophy.  These findings were reviewed with the patient in detail and based on discussion of risk benefits rehab recovery Elizabeth Contreras has elected to proceed with arthroscopic surgical treatment.  Details of the surgery were reviewed with the patient.  We look forward to treating her in the operating room.  Follow-Up Instructions: Return if symptoms worsen or fail to improve.   Orders:  No orders of the defined types were placed in this encounter.  No orders of the defined types were placed in this encounter.     Procedures: No procedures performed   Clinical Data: No additional findings.   Subjective: Chief Complaint  Patient presents with  . Left Shoulder - Follow-up    MRI review    He returns today for MRI review of the left shoulder.  The pain is slightly worse.   Review of Systems   Objective: Vital Signs: Ht 5\' 6"  (1.676 m)   Wt 135 lb (61.2 kg)   BMI 21.79 kg/m   Physical Exam  Ortho Exam Left shoulder shows normal range of motion with some mild discomfort at the extremes of range of motion.  Rotator cuff normal to manual muscle testing. Specialty Comments:  No specialty comments available.  Imaging: No results found.   PMFS History: Patient Active Problem List   Diagnosis Date Noted  . Superior labrum anterior-to-posterior (SLAP) tear of left shoulder 06/04/2019  . Chronic left shoulder pain  03/09/2019  . Subclinical hypothyroidism 11/21/2016  . Vitamin D deficiency 11/21/2016  . Common migraine with intractable migraine 06/20/2016   Past Medical History:  Diagnosis Date  . Asthma   . Common migraine with intractable migraine 06/20/2016  . Herpes   . Migraine without aura   . PCOS (polycystic ovarian syndrome)   . PTSD (post-traumatic stress disorder) 2015  . Sinusitis   . 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 Thyroid disease     Family History  Problem Relation Age of Onset  . Migraines Mother   . Aneurysm Mother   . Cancer Maternal Aunt        ovarian  . Cancer Paternal Uncle        colon    Past Surgical History:  Procedure Laterality Date  . COLPOSCOPY    . NASAL SINUS SURGERY  07/2015   Social History   Occupational History    Comment: unemployed  Tobacco Use  . Smoking status: Never Smoker  . Smokeless tobacco: Never Used  Substance and Sexual Activity  . Alcohol use: Yes    Comment: rarely  . Drug use: No  . Sexual activity: Yes    Partners: Male    Birth control/protection: None

## 2019-06-16 ENCOUNTER — Other Ambulatory Visit: Payer: Self-pay

## 2019-06-28 ENCOUNTER — Other Ambulatory Visit: Payer: Self-pay

## 2019-07-13 ENCOUNTER — Other Ambulatory Visit: Payer: Self-pay | Admitting: Family

## 2019-07-14 ENCOUNTER — Encounter (HOSPITAL_BASED_OUTPATIENT_CLINIC_OR_DEPARTMENT_OTHER): Payer: Self-pay | Admitting: Orthopaedic Surgery

## 2019-07-14 ENCOUNTER — Other Ambulatory Visit: Payer: Self-pay

## 2019-07-19 ENCOUNTER — Other Ambulatory Visit (HOSPITAL_COMMUNITY)
Admission: RE | Admit: 2019-07-19 | Discharge: 2019-07-19 | Disposition: A | Discharge status: Home / Self Care | Payer: BC Managed Care – PPO | Source: Ambulatory Visit | Attending: Orthopaedic Surgery | Admitting: Orthopaedic Surgery

## 2019-07-19 DIAGNOSIS — Z20822 Contact with and (suspected) exposure to covid-19: Secondary | ICD-10-CM | POA: Insufficient documentation

## 2019-07-19 DIAGNOSIS — Z01812 Encounter for preprocedural laboratory examination: Secondary | ICD-10-CM | POA: Insufficient documentation

## 2019-07-19 LAB — SARS CORONAVIRUS 2 (TAT 6-24 HRS): SARS Coronavirus 2: NEGATIVE

## 2019-07-19 NOTE — Progress Notes (Signed)

## 2019-07-21 ENCOUNTER — Encounter (HOSPITAL_BASED_OUTPATIENT_CLINIC_OR_DEPARTMENT_OTHER)
Admission: RE | Disposition: A | Discharge status: Home / Self Care | Payer: Self-pay | Source: Home / Self Care | Attending: Orthopaedic Surgery

## 2019-07-21 ENCOUNTER — Other Ambulatory Visit: Payer: Self-pay

## 2019-07-21 ENCOUNTER — Encounter: Payer: Self-pay | Admitting: Orthopaedic Surgery

## 2019-07-21 ENCOUNTER — Ambulatory Visit (HOSPITAL_BASED_OUTPATIENT_CLINIC_OR_DEPARTMENT_OTHER)
Admission: RE | Admit: 2019-07-21 | Discharge: 2019-07-21 | Disposition: A | Discharge status: Home / Self Care | Payer: BC Managed Care – PPO | Attending: Orthopaedic Surgery | Admitting: Orthopaedic Surgery

## 2019-07-21 ENCOUNTER — Ambulatory Visit (HOSPITAL_BASED_OUTPATIENT_CLINIC_OR_DEPARTMENT_OTHER): Payer: BC Managed Care – PPO | Admitting: Certified Registered Nurse Anesthetist

## 2019-07-21 ENCOUNTER — Other Ambulatory Visit: Payer: Self-pay | Admitting: Orthopaedic Surgery

## 2019-07-21 ENCOUNTER — Encounter (HOSPITAL_BASED_OUTPATIENT_CLINIC_OR_DEPARTMENT_OTHER): Payer: Self-pay | Admitting: Orthopaedic Surgery

## 2019-07-21 DIAGNOSIS — Z888 Allergy status to other drugs, medicaments and biological substances status: Secondary | ICD-10-CM | POA: Insufficient documentation

## 2019-07-21 DIAGNOSIS — G8918 Other acute postprocedural pain: Secondary | ICD-10-CM | POA: Diagnosis not present

## 2019-07-21 DIAGNOSIS — S43432A Superior glenoid labrum lesion of left shoulder, initial encounter: Secondary | ICD-10-CM | POA: Diagnosis not present

## 2019-07-21 DIAGNOSIS — M65812 Other synovitis and tenosynovitis, left shoulder: Secondary | ICD-10-CM | POA: Diagnosis not present

## 2019-07-21 DIAGNOSIS — X58XXXA Exposure to other specified factors, initial encounter: Secondary | ICD-10-CM | POA: Diagnosis not present

## 2019-07-21 DIAGNOSIS — Z79899 Other long term (current) drug therapy: Secondary | ICD-10-CM | POA: Diagnosis not present

## 2019-07-21 DIAGNOSIS — J45909 Unspecified asthma, uncomplicated: Secondary | ICD-10-CM | POA: Insufficient documentation

## 2019-07-21 DIAGNOSIS — M25812 Other specified joint disorders, left shoulder: Secondary | ICD-10-CM | POA: Diagnosis not present

## 2019-07-21 DIAGNOSIS — M71312 Other bursal cyst, left shoulder: Secondary | ICD-10-CM | POA: Diagnosis not present

## 2019-07-21 DIAGNOSIS — E039 Hypothyroidism, unspecified: Secondary | ICD-10-CM | POA: Diagnosis not present

## 2019-07-21 HISTORY — DX: Nausea with vomiting, unspecified: R11.2

## 2019-07-21 HISTORY — PX: SHOULDER ARTHROSCOPY WITH BICEPS TENDON REPAIR: SHX5674

## 2019-07-21 HISTORY — DX: Other specified postprocedural states: Z98.890

## 2019-07-21 LAB — POCT PREGNANCY, URINE: Preg Test, Ur: NEGATIVE

## 2019-07-21 SURGERY — SHOULDER ARTHROSCOPY WITH BICEPS TENDON REPAIR
Anesthesia: General | Site: Shoulder | Laterality: Left

## 2019-07-21 MED ORDER — MIDAZOLAM HCL 2 MG/2ML IJ SOLN
2.0000 mg | Freq: Once | INTRAMUSCULAR | Status: AC
  Administered 2019-07-21: 2 mg via INTRAVENOUS

## 2019-07-21 MED ORDER — CEFAZOLIN SODIUM-DEXTROSE 2-4 GM/100ML-% IV SOLN
INTRAVENOUS | Status: AC
  Filled 2019-07-21: qty 100

## 2019-07-21 MED ORDER — SCOPOLAMINE 1 MG/3DAYS TD PT72
MEDICATED_PATCH | TRANSDERMAL | Status: DC | PRN
  Administered 2019-07-21: 1 via TRANSDERMAL

## 2019-07-21 MED ORDER — DEXAMETHASONE SODIUM PHOSPHATE 10 MG/ML IJ SOLN
INTRAMUSCULAR | Status: AC
  Filled 2019-07-21: qty 1

## 2019-07-21 MED ORDER — DROPERIDOL 2.5 MG/ML IJ SOLN
INTRAMUSCULAR | Status: DC | PRN
Start: 2019-07-21 — End: 2019-07-21
  Administered 2019-07-21: .625 mg via INTRAVENOUS

## 2019-07-21 MED ORDER — OXYCODONE-ACETAMINOPHEN 5-325 MG PO TABS: 1.0000 | ORAL_TABLET | Freq: Three times a day (TID) | ORAL | 0 refills | Status: DC | PRN

## 2019-07-21 MED ORDER — FENTANYL CITRATE (PF) 250 MCG/5ML IJ SOLN
INTRAMUSCULAR | Status: DC | PRN
  Administered 2019-07-21: 50 ug via INTRAVENOUS

## 2019-07-21 MED ORDER — ONDANSETRON HCL 4 MG PO TABS: 4.0000 mg | ORAL_TABLET | Freq: Three times a day (TID) | ORAL | 0 refills | Status: DC | PRN

## 2019-07-21 MED ORDER — DROPERIDOL 2.5 MG/ML IJ SOLN
INTRAMUSCULAR | Status: AC
  Filled 2019-07-21: qty 2

## 2019-07-21 MED ORDER — PROPOFOL 10 MG/ML IV BOLUS
INTRAVENOUS | Status: DC | PRN
  Administered 2019-07-21: 200 mg via INTRAVENOUS

## 2019-07-21 MED ORDER — HYDROMORPHONE HCL 1 MG/ML IJ SOLN: 0.2500 mg | INTRAMUSCULAR | Status: DC | PRN

## 2019-07-21 MED ORDER — MIDAZOLAM HCL 2 MG/2ML IJ SOLN
INTRAMUSCULAR | Status: AC
  Filled 2019-07-21: qty 2

## 2019-07-21 MED ORDER — OXYCODONE HCL 5 MG PO TABS: 5.0000 mg | ORAL_TABLET | Freq: Once | ORAL | Status: DC | PRN

## 2019-07-21 MED ORDER — LIDOCAINE 2% (20 MG/ML) 5 ML SYRINGE
INTRAMUSCULAR | Status: DC | PRN
  Administered 2019-07-21: 100 mg via INTRAVENOUS

## 2019-07-21 MED ORDER — MEPERIDINE HCL 25 MG/ML IJ SOLN: 6.2500 mg | INTRAMUSCULAR | Status: DC | PRN

## 2019-07-21 MED ORDER — FENTANYL CITRATE (PF) 100 MCG/2ML IJ SOLN
INTRAMUSCULAR | Status: AC
  Filled 2019-07-21: qty 2

## 2019-07-21 MED ORDER — ROCURONIUM BROMIDE 10 MG/ML (PF) SYRINGE
PREFILLED_SYRINGE | INTRAVENOUS | Status: AC
  Filled 2019-07-21: qty 10

## 2019-07-21 MED ORDER — POVIDONE-IODINE 10 % EX SWAB
2.0000 "application " | Freq: Once | CUTANEOUS | Status: AC
  Administered 2019-07-21: 2 via TOPICAL

## 2019-07-21 MED ORDER — PROMETHAZINE HCL 25 MG/ML IJ SOLN
INTRAMUSCULAR | Status: AC
  Filled 2019-07-21: qty 1

## 2019-07-21 MED ORDER — PROMETHAZINE HCL 25 MG/ML IJ SOLN
6.2500 mg | INTRAMUSCULAR | Status: DC | PRN
  Administered 2019-07-21: 6.25 mg via INTRAVENOUS

## 2019-07-21 MED ORDER — SODIUM CHLORIDE 0.9 % IR SOLN
Status: DC | PRN
  Administered 2019-07-21: 10000 mL

## 2019-07-21 MED ORDER — LIDOCAINE 2% (20 MG/ML) 5 ML SYRINGE
INTRAMUSCULAR | Status: AC
  Filled 2019-07-21: qty 5

## 2019-07-21 MED ORDER — BUPIVACAINE LIPOSOME 1.3 % IJ SUSP
INTRAMUSCULAR | Status: DC | PRN
  Administered 2019-07-21: 10 mL via PERINEURAL

## 2019-07-21 MED ORDER — BUPIVACAINE HCL (PF) 0.5 % IJ SOLN
INTRAMUSCULAR | Status: DC | PRN
  Administered 2019-07-21: 20 mL via PERINEURAL

## 2019-07-21 MED ORDER — SCOPOLAMINE 1 MG/3DAYS TD PT72
MEDICATED_PATCH | TRANSDERMAL | Status: AC
  Filled 2019-07-21: qty 1

## 2019-07-21 MED ORDER — LACTATED RINGERS IV SOLN: INTRAVENOUS | Status: DC

## 2019-07-21 MED ORDER — OXYCODONE HCL 5 MG/5ML PO SOLN: 5.0000 mg | Freq: Once | ORAL | Status: DC | PRN

## 2019-07-21 MED ORDER — ONDANSETRON HCL 4 MG/2ML IJ SOLN
INTRAMUSCULAR | Status: DC | PRN
  Administered 2019-07-21: 4 mg via INTRAVENOUS

## 2019-07-21 MED ORDER — DEXAMETHASONE SODIUM PHOSPHATE 10 MG/ML IJ SOLN
INTRAMUSCULAR | Status: DC | PRN
  Administered 2019-07-21: 10 mg via INTRAVENOUS

## 2019-07-21 MED ORDER — CEFAZOLIN SODIUM-DEXTROSE 2-4 GM/100ML-% IV SOLN
2.0000 g | INTRAVENOUS | Status: AC
  Administered 2019-07-21: 2 g via INTRAVENOUS

## 2019-07-21 MED ORDER — ONDANSETRON HCL 4 MG/2ML IJ SOLN
INTRAMUSCULAR | Status: AC
  Filled 2019-07-21: qty 2

## 2019-07-21 MED ORDER — FENTANYL CITRATE (PF) 100 MCG/2ML IJ SOLN
100.0000 ug | Freq: Once | INTRAMUSCULAR | Status: AC
  Administered 2019-07-21: 100 ug via INTRAVENOUS

## 2019-07-21 SURGICAL SUPPLY — 78 items
ADH SKN CLS APL DERMABOND .7 (GAUZE/BANDAGES/DRESSINGS)
ANCH SUT SWLK 24.5 SLF PNCH VT (Anchor) ×1 IMPLANT
ANCHOR BIOCOMP SWIVELOCK (Anchor) ×2 IMPLANT
APL SKNCLS STERI-STRIP NONHPOA (GAUZE/BANDAGES/DRESSINGS)
BENZOIN TINCTURE PRP APPL 2/3 (GAUZE/BANDAGES/DRESSINGS) IMPLANT
BLADE SURG 15 STRL LF DISP TIS (BLADE) IMPLANT
BLADE SURG 15 STRL SS (BLADE)
BURR OVAL 8 FLU 4.0MM X 13CM (MISCELLANEOUS) ×1
BURR OVAL 8 FLU 4.0X13 (MISCELLANEOUS) ×2 IMPLANT
CANNULA 5.75X71 LONG (CANNULA) ×3 IMPLANT
CANNULA SHOULDER 7CM (CANNULA) ×3 IMPLANT
CANNULA TWIST IN 8.25X7CM (CANNULA) IMPLANT
CLOSURE WOUND 1/2 X4 (GAUZE/BANDAGES/DRESSINGS)
COVER MAYO STAND STRL (DRAPES) ×3 IMPLANT
COVER WAND RF STERILE (DRAPES) IMPLANT
DECANTER SPIKE VIAL GLASS SM (MISCELLANEOUS) IMPLANT
DERMABOND ADVANCED (GAUZE/BANDAGES/DRESSINGS)
DERMABOND ADVANCED .7 DNX12 (GAUZE/BANDAGES/DRESSINGS) IMPLANT
DISSECTOR  3.8MM X 13CM (MISCELLANEOUS) ×3
DISSECTOR 3.8MM X 13CM (MISCELLANEOUS) ×1 IMPLANT
DRAPE IMP U-DRAPE 54X76 (DRAPES) ×3 IMPLANT
DRAPE INCISE IOBAN 66X45 STRL (DRAPES) ×2 IMPLANT
DRAPE STERI 35X30 U-POUCH (DRAPES) ×3 IMPLANT
DRAPE U-SHAPE 47X51 STRL (DRAPES) ×3 IMPLANT
DRAPE U-SHAPE 76X120 STRL (DRAPES) ×6 IMPLANT
DRSG PAD ABDOMINAL 8X10 ST (GAUZE/BANDAGES/DRESSINGS) ×3 IMPLANT
DURAPREP 26ML APPLICATOR (WOUND CARE) ×3 IMPLANT
ELECT REM PT RETURN 9FT ADLT (ELECTROSURGICAL) ×3
ELECTRODE REM PT RTRN 9FT ADLT (ELECTROSURGICAL) ×1 IMPLANT
FIBER TAPE 2MM (SUTURE) IMPLANT
GAUZE SPONGE 4X4 12PLY STRL (GAUZE/BANDAGES/DRESSINGS) ×3 IMPLANT
GAUZE XEROFORM 1X8 LF (GAUZE/BANDAGES/DRESSINGS) ×3 IMPLANT
GLOVE BIOGEL PI IND STRL 7.0 (GLOVE) ×1 IMPLANT
GLOVE BIOGEL PI INDICATOR 7.0 (GLOVE)
GLOVE ECLIPSE 6.5 STRL STRAW (GLOVE) ×2 IMPLANT
GLOVE ECLIPSE 7.0 STRL STRAW (GLOVE) ×1 IMPLANT
GLOVE SKINSENSE NS SZ7.5 (GLOVE)
GLOVE SKINSENSE STRL SZ7.5 (GLOVE) ×1 IMPLANT
GLOVE SURG SS PI 7.5 STRL IVOR (GLOVE) ×6 IMPLANT
GLOVE SURG SYN 7.5  E (GLOVE) ×6
GLOVE SURG SYN 7.5 E (GLOVE) ×2 IMPLANT
GLOVE SURG SYN 7.5 PF PI (GLOVE) ×1 IMPLANT
GOWN STRL REIN XL XLG (GOWN DISPOSABLE) ×3 IMPLANT
GOWN STRL REUS W/ TWL LRG LVL3 (GOWN DISPOSABLE) ×1 IMPLANT
GOWN STRL REUS W/ TWL XL LVL3 (GOWN DISPOSABLE) ×1 IMPLANT
GOWN STRL REUS W/TWL LRG LVL3 (GOWN DISPOSABLE) ×3
GOWN STRL REUS W/TWL XL LVL3 (GOWN DISPOSABLE)
MANIFOLD NEPTUNE II (INSTRUMENTS) ×3 IMPLANT
NDL SCORPION MULTI FIRE (NEEDLE) IMPLANT
NEEDLE SCORPION MULTI FIRE (NEEDLE) ×3 IMPLANT
PACK DSU ARTHROSCOPY (CUSTOM PROCEDURE TRAY) ×3 IMPLANT
PORT APPOLLO RF 90DEGREE MULTI (SURGICAL WAND) ×2 IMPLANT
SET BASIN DAY SURGERY F.S. (CUSTOM PROCEDURE TRAY) ×3 IMPLANT
SHEET MEDIUM DRAPE 40X70 STRL (DRAPES) ×3 IMPLANT
SLEEVE SCD COMPRESS KNEE MED (MISCELLANEOUS) ×3 IMPLANT
SLING ARM FOAM STRAP LRG (SOFTGOODS) IMPLANT
SLING ARM FOAM STRAP MED (SOFTGOODS) ×2 IMPLANT
STRIP CLOSURE SKIN 1/2X4 (GAUZE/BANDAGES/DRESSINGS) IMPLANT
SUPPORT WRAP ARM LG (MISCELLANEOUS) ×1 IMPLANT
SUT ETHILON 3 0 PS 1 (SUTURE) ×3 IMPLANT
SUT FIBERWIRE #2 38 T-5 BLUE (SUTURE)
SUT MNCRL AB 4-0 PS2 18 (SUTURE) ×1 IMPLANT
SUT PDS AB 1 CT  36 (SUTURE)
SUT PDS AB 1 CT 36 (SUTURE) IMPLANT
SUT TIGER TAPE 7 IN WHITE (SUTURE) IMPLANT
SUT VIC AB 2-0 CT1 27 (SUTURE)
SUT VIC AB 2-0 CT1 TAPERPNT 27 (SUTURE) ×1 IMPLANT
SUTURE FIBERWR #2 38 T-5 BLUE (SUTURE) IMPLANT
SUTURE TAPE 1.3 40 TPR END (SUTURE) IMPLANT
SUTURE TAPE TIGERLINK 1.3MM BL (SUTURE) IMPLANT
SUTURETAPE 1.3 40 TPR END (SUTURE)
SUTURETAPE TIGERLINK 1.3MM BL (SUTURE) ×3
SYR 50ML SLIP (SYRINGE) ×3 IMPLANT
TOWEL GREEN STERILE FF (TOWEL DISPOSABLE) ×3 IMPLANT
TUBE CONNECTING 20'X1/4 (TUBING) ×1
TUBE CONNECTING 20X1/4 (TUBING) ×1 IMPLANT
TUBING ARTHROSCOPY IRRIG 16FT (MISCELLANEOUS) IMPLANT
WATER STERILE IRR 1000ML POUR (IV SOLUTION) ×3 IMPLANT

## 2019-07-21 NOTE — Discharge Instructions (Signed)
  Post-operative patient instructions  Shoulder Arthroscopy   . Ice:  Place intermittent ice or cooler pack over your shoulder, 30 minutes on and 30 minutes off.  Continue this for the first 72 hours after surgery, then save ice for use after therapy sessions or on more active days.   . Weight:  You may bear weight on your arm as your symptoms allow. . Motion:  Perform gentle shoulder motion as tolerated . Dressing:  Perform 1st dressing change at 2 days postoperative. A moderate amount of blood tinged drainage is to be expected.  So if you bleed through the dressing on the first or second day or if you have fevers, it is fine to change the dressing/check the wounds early and redress wound.  If it bleeds through again, or if the incisions are leaking frank blood, please call the office. May change dressing every 1-2 days thereafter to help watch wounds. Can purchase Tegaderm (or 3M Nexcare) water resistant dressings at local pharmacy / [facility]. . Shower:  Light shower is ok after 2 days.  Please take shower, NO bath. Recover with gauze and ace wrap to help keep wounds protected.   . Pain medication:  A narcotic pain medication has been prescribed.  Take as directed.  Typically you need narcotic pain medication more regularly during the first 3 to 5 days after surgery.  Decrease your use of the medication as the pain improves.  Narcotics can sometimes cause constipation, even after a few doses.  If you have problems with constipation, you can take an over the counter stool softener or light laxative.  If you have persistent problems, please notify your physician's office. . Physical therapy: Additional activity guidelines to be provided by your physician or physical therapist at follow-up visits.  . Driving: Do not recommend driving x 2 weeks post surgical, especially if surgery performed on right side. Should not drive while taking narcotic pain medications. It typically takes at least 2 weeks to  restore sufficient neuromuscular function for normal reaction times for driving safety.  . Call [phone] for questions or problems. Evenings you will be forwarded to the hospital operator.  Ask for the orthopaedic physician on call. Please call if you experience:    o Redness, foul smelling, or persistent drainage from the surgical site  o worsening shoulder pain and swelling not responsive to medication  o any calf pain and or swelling of the lower leg  o temperatures greater than 101.5 F o other questions or concerns   Thank you for allowing us to be a part of your care.    Post Anesthesia Home Care Instructions  Activity: Get plenty of rest for the remainder of the day. A responsible individual must stay with you for 24 hours following the procedure.  For the next 24 hours, DO NOT: -Drive a car -Operate machinery -Drink alcoholic beverages -Take any medication unless instructed by your physician -Make any legal decisions or sign important papers.  Meals: Start with liquid foods such as gelatin or soup. Progress to regular foods as tolerated. Avoid greasy, spicy, heavy foods. If nausea and/or vomiting occur, drink only clear liquids until the nausea and/or vomiting subsides. Call your physician if vomiting continues.  Special Instructions/Symptoms: Your throat may feel dry or sore from the anesthesia or the breathing tube placed in your throat during surgery. If this causes discomfort, gargle with warm salt water. The discomfort should disappear within 24 hours.  If you had a scopolamine patch   placed behind your ear for the management of post- operative nausea and/or vomiting:  1. The medication in the patch is effective for 72 hours, after which it should be removed.  Wrap patch in a tissue and discard in the trash. Wash hands thoroughly with soap and water. 2. You may remove the patch earlier than 72 hours if you experience unpleasant side effects which may include dry  mouth, dizziness or visual disturbances. 3. Avoid touching the patch. Wash your hands with soap and water after contact with the patch.    Regional Anesthesia Blocks  1. Numbness or the inability to move the "blocked" extremity may last from 3-48 hours after placement. The length of time depends on the medication injected and your individual response to the medication. If the numbness is not going away after 48 hours, call your surgeon.  2. The extremity that is blocked will need to be protected until the numbness is gone and the  Strength has returned. Because you cannot feel it, you will need to take extra care to avoid injury. Because it may be weak, you may have difficulty moving it or using it. You may not know what position it is in without looking at it while the block is in effect.  3. For blocks in the legs and feet, returning to weight bearing and walking needs to be done carefully. You will need to wait until the numbness is entirely gone and the strength has returned. You should be able to move your leg and foot normally before you try and bear weight or walk. You will need someone to be with you when you first try to ensure you do not fall and possibly risk injury.  4. Bruising and tenderness at the needle site are common side effects and will resolve in a few days.  5. Persistent numbness or new problems with movement should be communicated to the surgeon or the [facility] ([phone])/ [facility] ([phone]).Information for Discharge Teaching: EXPAREL (bupivacaine liposome injectable suspension)   Your surgeon or anesthesiologist gave you EXPAREL(bupivacaine) to help control your pain after surgery.   EXPAREL is a local anesthetic that provides pain relief by numbing the tissue around the surgical site.  EXPAREL is designed to release pain medication over time and can control pain for up to 72 hours.  Depending on how you respond to EXPAREL, you  may require less pain medication during your recovery.  Possible side effects:  Temporary loss of sensation or ability to move in the area where bupivacaine was injected.  Nausea, vomiting, constipation  Rarely, numbness and tingling in your mouth or lips, lightheadedness, or anxiety may occur.  Call your doctor right away if you think you may be experiencing any of these sensations, or if you have other questions regarding possible side effects.  Follow all other discharge instructions given to you by your surgeon or nurse. Eat a healthy diet and drink plenty of water or other fluids.  If you return to the hospital for any reason within 96 hours following the administration of EXPAREL, it is important for health care providers to know that you have received this anesthetic. A teal colored band has been placed on your arm with the date, time and amount of EXPAREL you have received in order to alert and inform your health care providers. Please leave this armband in place for the full 96 hours following administration, and then you may remove the band.   then you may remove the band.

## 2019-07-21 NOTE — H&P (Signed)
PREOPERATIVE H&P  Chief Complaint: left shoulder superior labrum anterior posterior tear, spinoglenoid cyst  HPI: NONNIE Contreras is a 39 y.o. female who presents for surgical treatment of left shoulder superior labrum anterior posterior tear, spinoglenoid cyst.  She denies any changes in medical history.  Past Medical History:  Diagnosis Date  . Asthma   . Common migraine with intractable migraine 06/20/2016  . Herpes   . Migraine without aura   . PCOS (polycystic ovarian syndrome)   . PONV (postoperative nausea and vomiting)   . PTSD (post-traumatic stress disorder) 2015  . Sinusitis   . 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 Thyroid disease    Past Surgical History:  Procedure Laterality Date  . COLPOSCOPY    . NASAL SINUS SURGERY  07/2015   Social History   Socioeconomic History  . Marital status: Married    Spouse name: Not on file  . Number of children: 0  . Years of education: 64  . Highest education level: Not on file  Occupational History    Comment: unemployed  Tobacco Use  . Smoking status: Never Smoker  . Smokeless tobacco: Never Used  Vaping Use  . Vaping Use: Never used  Substance and Sexual Activity  . Alcohol use: Yes    Comment: rarely  . Drug use: No  . Sexual activity: Yes    Partners: Male    Birth control/protection: None  Other Topics Concern  . Not on file  Social History Narrative   Lives with fiancee   Caffeine -tea 1-2 daily   Social Determinants of Health   Financial Resource Strain:   . Difficulty of Paying Living Expenses:   Food Insecurity:   . Worried About Charity fundraiser in the Last Year:   . Arboriculturist in the Last Year:   Transportation Needs:   . Film/video editor (Medical):   Marland Kitchen Lack of Transportation (Non-Medical):   Physical Activity:   . Days of Exercise per Week:   . Minutes of Exercise per Session:   Stress:   . Feeling of Stress :   Social Connections:   . Frequency of  Communication with Friends and Family:   . Frequency of Social Gatherings with Friends and Family:   . Attends Religious Services:   . Active Member of Clubs or Organizations:   . Attends Archivist Meetings:   Marland Kitchen Marital Status:    Family History  Problem Relation Age of Onset  . Migraines Mother   . Aneurysm Mother   . Cancer Maternal Aunt        ovarian  . Cancer Paternal Uncle        colon   Allergies  Allergen Reactions  . Topamax [Topiramate]     Cognitive slowing   Prior to Admission medications   Medication Sig Start Date End Date Taking? Authorizing Provider  acetaminophen (TYLENOL) 325 MG tablet Take 650 mg by mouth every 6 (six) hours as needed.   Yes [provider]  Cholecalciferol (VITAMIN D) 2000 units CAPS Take by mouth.   Yes [provider]  cyclobenzaprine (FLEXERIL) 5 MG tablet TAKE 1 TABLET BY MOUTH THREE TIMES A DAY AS NEEDED FOR MUSCLE SPASMS 05/21/19  Yes Billie Ruddy, MD  fluticasone (FLONASE) 50 MCG/ACT nasal spray Place 1 spray into both nostrils daily. 05/05/17  Yes Billie Ruddy, MD  ibuprofen (ADVIL,MOTRIN) 200 MG tablet Take 200  mg by mouth every 6 (six) hours as needed.   Yes [provider]  Loratadine (CLARITIN PO) Take by mouth daily at 12 noon.   Yes [provider]  Probiotic Product (PROBIOTIC-10 PO) Take by mouth daily.   Yes [provider]  riboflavin (VITAMIN B-2) 100 MG TABS tablet Take 100 mg by mouth daily.   Yes [provider]  Rimegepant Sulfate (NURTEC) 75 MG TBDP Take by mouth.   Yes [provider]  TURMERIC PO Take by mouth.   Yes [provider]  valACYclovir (VALTREX) 500 MG tablet TAKE 1 TABLET BY MOUTH EVERY DAY 02/10/19  Yes Billie Ruddy, MD  ALPRAZolam Duanne Moron) 0.25 MG tablet Take 1 tablet (0.25 mg total) by mouth 2 (two) times daily as needed for anxiety. 11/06/16   Billie Ruddy, MD  PROAIR HFA 108 [phone] Base) MCG/ACT inhaler INHALE 2  PUFFS EVERY 6 HOURS AS NEEDED FOR WHEEZE OR SHORTNESS OF BREATH 02/25/18   Billie Ruddy, MD     Positive ROS: All other systems have been reviewed and were otherwise negative with the exception of those mentioned in the HPI and as above.  Physical Exam: General: Alert, no acute distress Cardiovascular: No pedal edema Respiratory: No cyanosis, no use of accessory musculature GI: abdomen soft Skin: No lesions in the area of chief complaint Neurologic: Sensation intact distally Psychiatric: Patient is competent for consent with normal mood and affect Lymphatic: no lymphedema  MUSCULOSKELETAL: exam stable  Assessment: left shoulder superior labrum anterior posterior tear, spinoglenoid cyst  Plan: Plan for Procedure(s): LEFT SHOULDER ARTHROSCOPY WITH BICEPS TENODESIS  The risks benefits and alternatives were discussed with the patient including but not limited to the risks of nonoperative treatment, versus surgical intervention including infection, bleeding, nerve injury,  blood clots, cardiopulmonary complications, morbidity, mortality, among others, and they were willing to proceed.   Preoperative templating of the joint replacement has been completed, documented, and submitted to the Operating Room personnel in order to optimize intra-operative equipment management.   Eduard Roux, MD 07/21/2019 7:25 AM

## 2019-07-21 NOTE — Anesthesia Postprocedure Evaluation (Signed)
Anesthesia Post Note  Patient: Elizabeth Contreras  Procedure(s) Performed: LEFT SHOULDER ARTHROSCOPY WITH BICEPS TENODESIS WITH DEBRIDEMENT OF LABRUM (Left Shoulder)     Patient location during evaluation: PACU Anesthesia Type: General Level of consciousness: awake and alert Pain management: pain level controlled Vital Signs Assessment: post-procedure vital signs reviewed and stable Respiratory status: spontaneous breathing, nonlabored ventilation and respiratory function stable Cardiovascular status: blood pressure returned to baseline and stable Postop Assessment: no apparent nausea or vomiting Anesthetic complications: no   No complications documented.  Last Vitals:  Vitals:   07/21/19 1354 07/21/19 1400  BP:    Pulse: 78   Resp: 20   Temp:    SpO2: 97% 97%    Last Pain:  Vitals:   07/21/19 1345  TempSrc:   PainSc: 0-No pain                 Lynda Rainwater

## 2019-07-21 NOTE — Transfer of Care (Signed)
Immediate Anesthesia Transfer of Care Note  Patient: Elizabeth Contreras  Procedure(s) Performed: LEFT SHOULDER ARTHROSCOPY WITH BICEPS TENODESIS WITH DEBRIDEMENT OF LABRUM (Left Shoulder)  Patient Location: PACU  Anesthesia Type:GA combined with regional for post-op pain  Level of Consciousness: awake and drowsy  Airway & Oxygen Therapy: Patient Spontanous Breathing and Patient connected to nasal cannula oxygen  Post-op Assessment: Report given to RN and Post -op Vital signs reviewed and stable  Post vital signs: Reviewed and stable  Last Vitals:  Vitals Value Taken Time  BP 87/54 07/21/19 1317  Temp    Pulse 69 07/21/19 1321  Resp 13 07/21/19 1321  SpO2 100 % 07/21/19 1321  Vitals shown include unvalidated device data.  Last Pain:  Vitals:   07/21/19 1027  TempSrc: Oral  PainSc: 2       Patients Stated Pain Goal: 5 (44/96/75 9163)  Complications: No complications documented.

## 2019-07-21 NOTE — Progress Notes (Signed)
Assisted Dr. Miller with left, ultrasound guided, interscalene  block. Side rails up, monitors on throughout procedure. See vital signs in flow sheet. Tolerated Procedure well.  

## 2019-07-21 NOTE — Anesthesia Procedure Notes (Signed)
Anesthesia Regional Block: Interscalene brachial plexus block   Pre-Anesthetic Checklist: ,, timeout performed, Correct Patient, Correct Site, Correct Laterality, Correct Procedure, Correct Position, site marked, Risks and benefits discussed,  Surgical consent,  Pre-op evaluation,  At surgeon's request and post-op pain management  Laterality: Left  Prep: chloraprep       Needles:  Injection technique: Single-shot  Needle Type: Stimiplex     Needle Length: 9cm  Needle Gauge: 21     Additional Needles:   Procedures:,,,, ultrasound used (permanent image in chart),,,,  Narrative:  Start time: 07/21/2019 11:25 AM End time: 07/21/2019 11:30 AM Injection made incrementally with aspirations every 5 mL.  Performed by: Personally  Anesthesiologist: Lynda Rainwater, MD

## 2019-07-21 NOTE — Anesthesia Preprocedure Evaluation (Signed)
Anesthesia Evaluation  Patient identified by MRN, date of birth, ID band Patient awake    Reviewed: Allergy & Precautions, NPO status , Patient's Chart, lab work & pertinent test results  History of Anesthesia Complications (+) PONV  Airway Mallampati: II  TM Distance: >3 FB Neck ROM: Full    Dental no notable dental hx.    Pulmonary asthma ,    Pulmonary exam normal breath sounds clear to auscultation       Cardiovascular negative cardio ROS Normal cardiovascular exam Rhythm:Regular Rate:Normal     Neuro/Psych  Headaches, Anxiety negative psych ROS   GI/Hepatic negative GI ROS, Neg liver ROS,   Endo/Other    Renal/GU negative Renal ROS  negative genitourinary   Musculoskeletal negative musculoskeletal ROS (+)   Abdominal   Peds negative pediatric ROS (+)  Hematology negative hematology ROS (+)   Anesthesia Other Findings   Reproductive/Obstetrics negative OB ROS                             Anesthesia Physical Anesthesia Plan  ASA: II  Anesthesia Plan: General   Post-op Pain Management:  Regional for Post-op pain   Induction: Intravenous  PONV Risk Score and Plan: 4 or greater and Ondansetron, Dexamethasone, Midazolam, Droperidol and Treatment may vary due to age or medical condition  Airway Management Planned: LMA  Additional Equipment:   Intra-op Plan:   Post-operative Plan: Extubation in OR  Informed Consent: I have reviewed the patients History and Physical, chart, labs and discussed the procedure including the risks, benefits and alternatives for the proposed anesthesia with the patient or authorized representative who has indicated his/her understanding and acceptance.     Dental advisory given  Plan Discussed with: CRNA  Anesthesia Plan Comments:         Anesthesia Quick Evaluation

## 2019-07-21 NOTE — Anesthesia Procedure Notes (Signed)
Procedure Name: LMA Insertion Date/Time: 07/21/2019 11:58 AM Performed by: [place] Stonewall Doss C, CRNA Pre-anesthesia Checklist: Patient identified, Emergency Drugs available, Suction available and Patient being monitored Patient Re-evaluated:Patient Re-evaluated prior to induction Oxygen Delivery Method: Circle system utilized Preoxygenation: Pre-oxygenation with 100% oxygen Induction Type: IV induction Ventilation: Mask ventilation without difficulty LMA: LMA inserted LMA Size: 4.0 Number of attempts: 1 Airway Equipment and Method: Bite block Placement Confirmation: positive ETCO2 Tube secured with: Tape Dental Injury: Teeth and Oropharynx as per pre-operative assessment

## 2019-07-21 NOTE — Op Note (Signed)
   Date of Surgery: 07/21/2019  INDICATIONS: The patient is a 39 year old female with left shoulder pain that has failed conservative treatment;  The patient did consent to the procedure after discussion of the risks and benefits.  PREOPERATIVE DIAGNOSIS:  1. 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 Left shoulder synovitis  POSTOPERATIVE DIAGNOSIS: Same.  PROCEDURE:  1.  Arthroscopic left shoulder biceps tenodesis 2.  Arthroscopic left shoulder extensive debridement of labrum, synovitis, rotator interval  SURGEON: N. Eduard Roux, M.D.  ASSIST: none.  ANESTHESIA:  general, regional  IV FLUIDS AND URINE: See anesthesia.  ESTIMATED BLOOD LOSS: minimal mL.  IMPLANTS: Arthrex 4.75 mm swivel lock  COMPLICATIONS: None.  DESCRIPTION OF PROCEDURE: The patient was brought to the operating room and placed supine on the operating table.  The patient had been signed prior to the procedure and this was documented. The patient had the anesthesia placed by the anesthesiologist.  A time-out was performed to confirm that this was the correct patient, site, side and location. The patient did receive antibiotics prior to the incision and was re-dosed during the procedure as needed at indicated intervals.  The patient was then positioned into the beach chair position with all bony prominences well padded and neutral C spine. The patient had the operative extremity prepped and draped in the standard surgical fashion.    Incision was made on the posterior aspect of the shoulder for standard posterior shoulder arthroscopy portal.  I then made a separate incision lateral to the coracoid in the rotator interval using needle localization for a an anterior portal.  Once the 2 portals were established we performed a diagnostic shoulder arthroscopy.  She did have a fair amount of inflammation throughout her shoulder joint including the rotator interval, labrum, articular surface of the rotator cuff.   She had a large displaced SLAP tear that was unstable to probing.  The biceps tendon itself was intact and unremarkable.  Extensive debridement with an oscillating shaver was performed of the chondral surface of the humeral head as well as the synovitis in the labrum.  The subscapularis tendon was unremarkable.  Arthroscopic biceps tenodesis was then performed using a tiger loop to make a luggage tag around the biceps tendon.  I then used a scorpion to lock the suture into the biceps tendon.  Soft tissues were cleared around the anterior portal site.  We then tenodesis the biceps into the bicipital groove just superior to the subscapularis tendon under direct arthroscopic visualization.  She had excellent bone quality.  Gentle chondroplasty was performed around the anchor site.  Excess fluid was removed from the shoulder joint.  Incisions were closed with interrupted nylon sutures.  Sterile dressings were applied.  Patient tolerated the procedure well had no me complications.  POSTOPERATIVE PLAN: Patient will discharge home and follow-up in 1 week for suture removal.  Azucena Cecil, MD [facility] [phone] 1:04 PM

## 2019-07-22 ENCOUNTER — Telehealth: Payer: Self-pay

## 2019-07-22 ENCOUNTER — Encounter (HOSPITAL_BASED_OUTPATIENT_CLINIC_OR_DEPARTMENT_OTHER): Payer: Self-pay | Admitting: Orthopaedic Surgery

## 2019-07-22 MED ORDER — TRAMADOL HCL 50 MG PO TABS: 50.0000 mg | ORAL_TABLET | Freq: Every day | ORAL | 0 refills | Status: DC | PRN

## 2019-07-22 NOTE — Telephone Encounter (Signed)
Dr Erlinda Hong,  I just called him back and he states they really was thinking about a medication that would not have her completely out of it; Tylenol or Ibuprofen was suggested. Is either of those medications appropriate for the patient? What do you advise, thank you?

## 2019-07-22 NOTE — Telephone Encounter (Signed)
Tylenol and ibuprofen are both fine to take.  She can get those OTC.  Does she want something stronger for breakthrough pain?

## 2019-07-22 NOTE — Telephone Encounter (Signed)
Dr Erlinda Hong,  I called patient's significant again and informed him of your message. He also states he would like the Tramadol for breakthrough pain and he will pick up at pharmacy. Thank you.

## 2019-07-22 NOTE — Telephone Encounter (Signed)
Sure does she want norco or tramadol?

## 2019-07-23 ENCOUNTER — Telehealth: Payer: Self-pay

## 2019-07-23 NOTE — Telephone Encounter (Signed)
Patient's husband would like to know if patient should be moving her shoulder or what kind of movement is patient able to do? Patient had left shoulder surgery on 07/21/2019.  CB# [phone].  Please advise.  Thank you.

## 2019-07-23 NOTE — Telephone Encounter (Signed)
Please advise. Thanks.  

## 2019-07-23 NOTE — Telephone Encounter (Signed)
Spoke to husband

## 2019-07-28 ENCOUNTER — Encounter: Payer: Self-pay | Admitting: Orthopaedic Surgery

## 2019-07-28 ENCOUNTER — Ambulatory Visit (INDEPENDENT_AMBULATORY_CARE_PROVIDER_SITE_OTHER): Payer: BC Managed Care – PPO | Admitting: Orthopaedic Surgery

## 2019-07-28 DIAGNOSIS — S43432A Superior glenoid labrum lesion of left shoulder, initial encounter: Secondary | ICD-10-CM

## 2019-07-28 MED ORDER — PROMETHAZINE HCL 25 MG PO TABS
25.0000 mg | ORAL_TABLET | Freq: Four times a day (QID) | ORAL | 1 refills | Status: DC | PRN
Start: 2019-07-28 — End: 2019-11-08

## 2019-07-28 NOTE — Progress Notes (Signed)
Post-Op Visit Note   Patient: Elizabeth Contreras           Date of Birth: 1980-02-15           MRN: [id number] Visit Date: 07/28/2019 PCP: Elizabeth Ruddy, MD   Assessment & Plan:  Chief Complaint:  Chief Complaint  Patient presents with  . Left Shoulder - Pain, Routine Post Op   Visit Diagnoses:  1. Superior labrum anterior-to-posterior (SLAP) tear of left shoulder     Plan: Elizabeth Contreras is a 1 week status post left shoulder scope and biceps tenodesis for SLAP tear.  She is overall doing well.  Taking Advil and Tylenol.  Zofran gives her headaches and would like another antinausea medicine.  She is not taking any Percocets.  Surgical incisions are healed.  No significant swelling or bruising.  Limited range of motion.  Neurovascular intact.  I reviewed the operative findings with the patient which showed a large SLAP tear as well as inflammation throughout her tissues consistent with adhesive capsulitis.  If she has trouble getting her range of motion with PT we may need to consider an intra-articular steroid injection at the next follow-up.  I sent in a prescription for Phenergan.  She will also take antacids as the ibuprofen could potentially be causing some GI upset.  Would like to recheck her in 4 weeks.  In the meantime she will do outpatient PT.  No lifting more than 10 pounds for the first 6 weeks.  Follow-Up Instructions: Return in about 4 weeks (around 08/25/2019).   Orders:  Orders Placed This Encounter  Procedures  . Ambulatory referral to Physical Therapy   Meds ordered this encounter  Medications  . promethazine (PHENERGAN) 25 MG tablet    Sig: Take 1 tablet (25 mg total) by mouth every 6 (six) hours as needed for nausea.    Dispense:  20 tablet    Refill:  1    Imaging: No results found.  PMFS History: Patient Active Problem List   Diagnosis Date Noted  . Superior labrum anterior-to-posterior (SLAP) tear of left shoulder 06/04/2019  . Subclinical hypothyroidism  11/21/2016  . Vitamin D deficiency 11/21/2016  . Common migraine with intractable migraine 06/20/2016   Past Medical History:  Diagnosis Date  . Asthma   . Common migraine with intractable migraine 06/20/2016  . Herpes   . Migraine without aura   . PCOS (polycystic ovarian syndrome)   . PONV (postoperative nausea and vomiting)   . PTSD (post-traumatic stress disorder) 2015  . Sinusitis   . 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 Thyroid disease     Family History  Problem Relation Age of Onset  . Migraines Mother   . Aneurysm Mother   . Cancer Maternal Aunt        ovarian  . Cancer Paternal Uncle        colon    Past Surgical History:  Procedure Laterality Date  . COLPOSCOPY    . NASAL SINUS SURGERY  07/2015  . SHOULDER ARTHROSCOPY WITH BICEPS TENDON REPAIR Left 07/21/2019   Procedure: LEFT SHOULDER ARTHROSCOPY WITH BICEPS TENODESIS WITH DEBRIDEMENT OF LABRUM;  Surgeon: Leandrew Koyanagi, MD;  Location: [place];  Service: Orthopedics;  Laterality: Left;   Social History   Occupational History    Comment: unemployed  Tobacco Use  . Smoking status: Never Smoker  . Smokeless tobacco: Never Used  Vaping Use  . Vaping Use:  Never used  Substance and Sexual Activity  . Alcohol use: Yes    Comment: rarely  . Drug use: No  . Sexual activity: Yes    Partners: Male    Birth control/protection: None

## 2019-08-11 ENCOUNTER — Ambulatory Visit: Payer: BC Managed Care – PPO | Admitting: Physical Therapy

## 2019-08-11 ENCOUNTER — Encounter: Payer: Self-pay | Admitting: Physical Therapy

## 2019-08-11 ENCOUNTER — Other Ambulatory Visit: Payer: Self-pay

## 2019-08-11 DIAGNOSIS — M25612 Stiffness of left shoulder, not elsewhere classified: Secondary | ICD-10-CM

## 2019-08-11 DIAGNOSIS — M25512 Pain in left shoulder: Secondary | ICD-10-CM

## 2019-08-11 DIAGNOSIS — M6281 Muscle weakness (generalized): Secondary | ICD-10-CM | POA: Diagnosis not present

## 2019-08-11 DIAGNOSIS — R600 Localized edema: Secondary | ICD-10-CM

## 2019-08-11 NOTE — Patient Instructions (Signed)
Access Code: [email or web address] URL: [email or web address] Date: 08/11/2019 Prepared by: [place] Hard  Exercises Seated Bilateral Shoulder Flexion Towel Slide at Table Top - 3 x daily - 7 x weekly - 1 sets - 10 reps - 5-10 seconds hold Seated Shoulder Scaption Slide at Table Top with Forearm in Neutral - 3 x daily - 7 x weekly - 2 sets - 10 reps - 5-10 seconds hold Supine Shoulder Flexion Extension AAROM with Dowel - 3 x daily - 7 x weekly - 2 sets - 10 reps - 5-10 seconds hold Supine Shoulder External Rotation in 45 Degrees Abduction AAROM with Dowel - 3 x daily - 7 x weekly - 1 sets - 10 reps - 5-10 seocnds hold Seated Shoulder External Rotation PROM on Table - 7 x weekly - 1 sets - 10 reps - 5-10 hold

## 2019-08-11 NOTE — Therapy (Signed)
[facility] [place], [place], [address] Phone: [phone]   Fax:  [phone]  Physical Therapy Evaluation  Patient Details  Name: Elizabeth Contreras MRN: [id number] Date of Birth: [birth year] No data recorded  Encounter Date: 08/11/2019   PT End of Session - 08/11/19 1249    Visit Number 1    Number of Visits 12    Date for PT Re-Evaluation 09/24/19    PT Start Time 0930    PT Stop Time 3546    PT Time Calculation (min) 44 min    Activity Tolerance Patient tolerated treatment well    Behavior During Therapy [facility] for tasks assessed/performed           Past Medical History:  Diagnosis Date  . Asthma   . Common migraine with intractable migraine 06/20/2016  . Herpes   . Migraine without aura   . PCOS (polycystic ovarian syndrome)   . PONV (postoperative nausea and vomiting)   . PTSD (post-traumatic stress disorder) 2015  . Sinusitis   . 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 Thyroid disease     Past Surgical History:  Procedure Laterality Date  . COLPOSCOPY    . NASAL SINUS SURGERY  07/2015  . SHOULDER ARTHROSCOPY WITH BICEPS TENDON REPAIR Left 07/21/2019   Procedure: LEFT SHOULDER ARTHROSCOPY WITH BICEPS TENODESIS WITH DEBRIDEMENT OF LABRUM;  Surgeon: Leandrew Koyanagi, MD;  Location: [place];  Service: Orthopedics;  Laterality: Left;    There were no vitals filed for this visit.    Subjective Assessment - 08/11/19 1222    Subjective Pt arriving to therapy s/p SLAP repair biceps tenodesis on 07/21/2019. Pt reporting pain with attempted movements. Pt stating that prior surgery she had been dealing with frozen shoulder.    Pertinent History SLAP repair, biceps tenodesis L shoulder on 07/21/2019, allergies    Limitations Lifting;House hold activities    Patient Stated Goals Use shoulder without pain and no limitations    Currently in Pain? 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  No      Home Environment   Living Environment Private residence    Living Arrangements Spouse/significant other    Type of [place] Access Level entry    [place] Two level  Alternate Level Stairs-Number of Steps flight    Alternate Level Stairs-Rails Right      Cognition   Overall Cognitive Status Within Functional Limits for tasks assessed      Posture/Postural Control   Posture/Postural Control Postural limitations    Postural Limitations Rounded Shoulders      ROM / Strength   AROM / PROM / Strength AROM;PROM;Strength      AROM   AROM Assessment Site Shoulder    Right/Left Shoulder Right;Left    Right Shoulder Extension 65 Degrees    Right Shoulder Flexion 170 Degrees    Right Shoulder ABduction 168 Degrees    Right Shoulder Internal Rotation --   thumb to T7   Right Shoulder External Rotation 86 Degrees    Left Shoulder Extension 30 Degrees    Left Shoulder Flexion 62 Degrees    Left Shoulder Internal Rotation --   thumb to sacrum   Left Shoulder External Rotation 44 Degrees      PROM   PROM Assessment Site  Shoulder    Right/Left Shoulder Left    Left Shoulder Extension 40 Degrees    Left Shoulder Flexion 118 Degrees    Left Shoulder External Rotation 60 Degrees      Palpation   Palpation comment TTP anterior and posterior shoulder, biceps tendon      Transfers   Five time sit to stand comments  12 seconds no UE support      Ambulation/Gait   Gait Comments normalized gait pattern with decreased arm swing on L shoulder                      Objective measurements completed on examination: See above findings.       [facility] Adult PT Treatment/Exercise - 08/11/19 0001      Exercises   Exercises Shoulder      Shoulder Exercises: Supine   External Rotation AAROM;Left;10 reps;Limitations   1# wand    Flexion AAROM;10 reps;Limitations   1# wand     Shoulder Exercises: Stretch   Table Stretch - Flexion 3 reps;10 seconds    Table Stretch - External Rotation 3 reps;10 seconds    Other Shoulder Stretches scaption table slides x 3 holding 10 seconds                  PT Education - 08/11/19 1010    Education Details HEP, instructed in icing to help with pain and edema    Person(s) Educated Patient    Methods Explanation;Demonstration;Handout    Comprehension Returned demonstration;Verbalized understanding               PT Long Term Goals - 08/11/19 1239      PT LONG TERM GOAL #1   Title Pt will be independent in her HEP and progression.    Baseline issued initial program on 08/11/2019    Time 6    Period Weeks    Status New    Target Date 09/24/19      PT LONG TERM GOAL #2   Title Pt will be able to improve her L shoulder flexion to >/= 160 degrees to improve ADL's.    Baseline 118 degrees+    Time 6    Period Weeks    Status New    Target Date 09/24/19      PT LONG TERM GOAL #3   Title Pt will be able to perform basic ALD's with </=2.10  pain reported in  L shoulder.    Baseline pain varies and increases with movements    Time 6    Period Weeks      Status New      PT LONG TERM GOAL #4   Title Pt will improve her active L shoulder ER to >/=70 degrees to improve functional mobility.    Baseline PROM: 60 degrees    Time 6    Period Weeks    Status New    Target Date 09/24/19                  Plan - 08/11/19 1230    Clinical Impression Statement Pt arriving to therapy s/p SLAP repair biceps tenodesis by Dr. Erlinda Hong on 07/21/2019.  Pt presenting with decreased ROM and pain in L shoulder. L shoulder PROM flexion: 118 degrees, ER 60 degrees, extension 40 degrees.  Pt was edu in HEP and issued handout to progress towrad pt's PLOF. Skilled PT needed to address pt's impairments with the below interventions.    Examination-Activity Limitations Carry;Lift;Dressing    Examination-Participation Restrictions Cleaning;Other    Clinical Decision Making Low    Rehab Potential Good    PT Frequency 2x / week    PT Duration 6 weeks    PT Treatment/Interventions ADLs/Self Care Home Management;Cryotherapy;Electrical Stimulation;Iontophoresis 4mg /ml Dexamethasone;Ultrasound;Gait training;Stair training;Functional mobility training;Therapeutic activities;Therapeutic exercise;Balance training;Neuromuscular re-education;Patient/family education;Manual techniques;Passive range of motion;Taping    PT Next Visit Plan no lifting more than 10#, shoulder ROM per general protocol    PT Home Exercise Plan [email or web address]    Consulted and Agree with Plan of Care Patient           Patient will benefit from skilled therapeutic intervention in order to improve the following deficits and impairments:  Pain, Postural dysfunction, Decreased activity tolerance, Decreased strength, Increased edema, Decreased range of motion  Visit Diagnosis: Acute pain of left shoulder  Stiffness of left shoulder, not elsewhere classified  Muscle weakness (generalized)  Localized edema     Problem List Patient Active Problem List   Diagnosis Date Noted  . Superior labrum  anterior-to-posterior (SLAP) tear of left shoulder 06/04/2019  . Subclinical hypothyroidism 11/21/2016  . Vitamin D deficiency 11/21/2016  . Common migraine with intractable migraine 06/20/2016    Oretha Caprice, PT, MPT 08/11/2019, 12:50 PM  [facility] Physical Therapy [address] [place], [place], [address] Phone: [phone]   Fax:  [phone]  Name: Elizabeth Contreras MRN: [id number] Date of Birth: May 28, 1980

## 2019-08-17 ENCOUNTER — Encounter: Payer: Self-pay | Admitting: Rehabilitative and Restorative Service Providers"

## 2019-08-17 ENCOUNTER — Ambulatory Visit: Payer: BC Managed Care – PPO | Admitting: Rehabilitative and Restorative Service Providers"

## 2019-08-17 ENCOUNTER — Other Ambulatory Visit: Payer: Self-pay

## 2019-08-17 DIAGNOSIS — M25612 Stiffness of left shoulder, not elsewhere classified: Secondary | ICD-10-CM | POA: Diagnosis not present

## 2019-08-17 DIAGNOSIS — M25512 Pain in left shoulder: Secondary | ICD-10-CM | POA: Diagnosis not present

## 2019-08-17 DIAGNOSIS — M6281 Muscle weakness (generalized): Secondary | ICD-10-CM

## 2019-08-17 NOTE — Patient Instructions (Signed)
Access Code: [email or web address] URL: [email or web address] Date: 08/17/2019 Prepared by: [place]  Exercises Standing Scapular Retraction - 5 x daily - 7 x weekly - 1 sets - 5 reps - 5 hold Supine Single Arm Shoulder Protraction - 2-3 x daily - 7 x weekly - 1 sets - 20 reps - 3 seconds hold Supine Shoulder Internal Rotation Stretch - 2-3 x daily - 7 x weekly - 1 sets - 10-20 reps - 10 seconds hold Supine Shoulder External Rotation Stretch - 2-3 x daily - 7 x weekly - 1 sets - 10-20 reps - 10 seconds hold Supine Shoulder Flexion AAROM with Hands Clasped - 2-3 x daily - 7 x weekly - 1 sets - 10-20 reps - 10 seconds hold

## 2019-08-17 NOTE — Therapy (Signed)
[facility] [address] [place], [place], [address] Phone: [phone]   Fax:  [phone]  Physical Therapy Treatment  Patient Details  Name: Elizabeth Contreras MRN: [id number] Date of Birth: 03-Jun-1980 No data recorded  Encounter Date: 08/17/2019   PT End of Session - 08/17/19 1448    Visit Number 2    Number of Visits 12    Date for PT Re-Evaluation 09/24/19    PT Start Time 9323    PT Stop Time 1430    PT Time Calculation (min) 43 min    Activity Tolerance Patient tolerated treatment well;No increased pain    Behavior During Therapy WFL for tasks assessed/performed           Past Medical History:  Diagnosis Date  . Asthma   . Common migraine with intractable migraine 06/20/2016  . Herpes   . Migraine without aura   . PCOS (polycystic ovarian syndrome)   . PONV (postoperative nausea and vomiting)   . PTSD (post-traumatic stress disorder) 2015  . Sinusitis   . 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 Thyroid disease     Past Surgical History:  Procedure Laterality Date  . COLPOSCOPY    . NASAL SINUS SURGERY  07/2015  . SHOULDER ARTHROSCOPY WITH BICEPS TENDON REPAIR Left 07/21/2019   Procedure: LEFT SHOULDER ARTHROSCOPY WITH BICEPS TENODESIS WITH DEBRIDEMENT OF LABRUM;  Surgeon: Leandrew Koyanagi, MD;  Location: [place];  Service: Orthopedics;  Laterality: Left;    There were no vitals filed for this visit.   Subjective Assessment - 08/17/19 1445    Subjective Elizabeth Contreras reports good early HEP compliance.    Pertinent History SLAP repair, biceps tenodesis L shoulder on 07/21/2019, allergies    Limitations Lifting;House hold activities    Patient Stated Goals Use shoulder without pain and no limitations    Currently in Pain? Yes    Pain Score 3     Pain Location Shoulder    Pain Orientation Left    Pain Descriptors / Indicators Aching;Sore;Tightness    Pain Type Surgical pain    Pain Onset 1 to 4  weeks ago    Pain Frequency Intermittent    Aggravating Factors  End range AROM, particularly ER    Pain Relieving Factors Ice    Effect of Pain on Daily Activities Affects all L UE use              [facility] PT Assessment - 08/17/19 0001      AROM   Left Shoulder Flexion 120 Degrees    Left Shoulder Internal Rotation 30 Degrees    Left Shoulder External Rotation 30 Degrees                         [facility] Adult PT Treatment/Exercise - 08/17/19 0001      Exercises   Exercises Shoulder      Shoulder Exercises: Supine   Protraction Strengthening;20 reps   3 second hold   External Rotation AROM;10 reps   10 seconds to comfort   Internal Rotation AROM;10 reps   10 seconds   Flexion AROM;10 reps   10 seconds Palms in     Shoulder Exercises: Standing   Retraction Strengthening;10 reps   5 seconds shoulder blade pinches                 PT Education - 08/17/19 1446    Education  Details HEP, reviewed surgical notes and goals of rehabilitation at this stage of healing (AROM with conservative ER progression for another 2 weeks).    Person(s) Educated Patient    Methods Explanation;Demonstration;Handout;Tactile cues    Comprehension Verbalized understanding;Returned demonstration;Need further instruction;Verbal cues required;Tactile cues required               PT Long Term Goals - 08/11/19 1239      PT LONG TERM GOAL #1   Title Pt will be independent in her HEP and progression.    Baseline issued initial program on 08/11/2019    Time 6    Period Weeks    Status New    Target Date 09/24/19      PT LONG TERM GOAL #2   Title Pt will be able to improve her L shoulder flexion to >/= 160 degrees to improve ADL's.    Baseline 118 degrees+    Time 6    Period Weeks    Status New    Target Date 09/24/19      PT LONG TERM GOAL #3   Title Pt will be able to perform basic ALD's with </=2.10  pain reported in L shoulder.    Baseline pain varies and increases  with movements    Time 6    Period Weeks    Status New      PT LONG TERM GOAL #4   Title Pt will improve her active L shoulder ER to >/=70 degrees to improve functional mobility.    Baseline PROM: 60 degrees    Time 6    Period Weeks    Status New    Target Date 09/24/19                 Plan - 08/17/19 1449    Clinical Impression Statement Elizabeth Contreras reports good early HEP compliance.  AROM is limited in all directions.  Returning normal capsular and shoulder range will be the emphasis for the next 2 weeks with particular attention on more conservative ER AROM progression.  Low resistance strengthening (no biceps) will be started week 7 while still focusing on AROM.    Examination-Activity Limitations Carry;Lift;Dressing    Examination-Participation Restrictions Cleaning;Other    Rehab Potential Good    PT Frequency 2x / week    PT Duration 6 weeks    PT Treatment/Interventions ADLs/Self Care Home Management;Cryotherapy;Electrical Stimulation;Iontophoresis 4mg /ml Dexamethasone;Ultrasound;Gait training;Stair training;Functional mobility training;Therapeutic activities;Therapeutic exercise;Balance training;Neuromuscular re-education;Patient/family education;Manual techniques;Passive range of motion;Taping    PT Next Visit Plan AROM emphasis.  Conservative with ER AROM progression for the next 2 weeks.    PT Home Exercise Plan Access Code: [email or web address]    [email or web address] and Agree with Plan of Care Patient           Patient will benefit from skilled therapeutic intervention in order to improve the following deficits and impairments:  Pain, Postural dysfunction, Decreased activity tolerance, Decreased strength, Increased edema, Decreased range of motion  Visit Diagnosis: Stiffness of left shoulder, not elsewhere classified  Acute pain of left shoulder  Muscle weakness (generalized)     Problem List Patient Active Problem List   Diagnosis Date Noted  . Superior labrum  anterior-to-posterior (SLAP) tear of left shoulder 06/04/2019  . Subclinical hypothyroidism 11/21/2016  . Vitamin D deficiency 11/21/2016  . Common migraine with intractable migraine 06/20/2016    Farley Ly PT, MPT 08/17/2019, 2:52 PM  [facility] [address] [place], [place], [address] Phone: [phone]  Fax:  [phone]  Name: Elizabeth Contreras MRN: [id number] Date of Birth: [birth year]

## 2019-08-20 ENCOUNTER — Ambulatory Visit: Payer: BC Managed Care – PPO | Admitting: Physical Therapy

## 2019-08-20 ENCOUNTER — Encounter: Payer: Self-pay | Admitting: Physical Therapy

## 2019-08-20 ENCOUNTER — Other Ambulatory Visit: Payer: Self-pay

## 2019-08-20 DIAGNOSIS — M25512 Pain in left shoulder: Secondary | ICD-10-CM | POA: Diagnosis not present

## 2019-08-20 DIAGNOSIS — M6281 Muscle weakness (generalized): Secondary | ICD-10-CM

## 2019-08-20 DIAGNOSIS — M25612 Stiffness of left shoulder, not elsewhere classified: Secondary | ICD-10-CM | POA: Diagnosis not present

## 2019-08-20 DIAGNOSIS — R600 Localized edema: Secondary | ICD-10-CM | POA: Diagnosis not present

## 2019-08-20 NOTE — Therapy (Signed)
[place], [place], [address] Phone: [phone]   Fax:  [phone]  Physical Therapy Treatment  Patient Details  Name: Elizabeth Contreras MRN: [id number] Date of Birth: 11/19/1980 Referring Provider (PT): Frankey Shown MD   Encounter Date: 08/20/2019   PT End of Session - 08/20/19 1427    Visit Number 3    Number of Visits 12    Date for PT Re-Evaluation 09/24/19    PT Start Time 7494    PT Stop Time 1509    PT Time Calculation (min) 44 min    Activity Tolerance Patient tolerated treatment well;No increased pain    Behavior During Therapy WFL for tasks assessed/performed           Past Medical History:  Diagnosis Date  . Asthma   . Common migraine with intractable migraine 06/20/2016  . Herpes   . Migraine without aura   . PCOS (polycystic ovarian syndrome)   . PONV (postoperative nausea and vomiting)   . PTSD (post-traumatic stress disorder) 2015  . Sinusitis   . 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 Thyroid disease     Past Surgical History:  Procedure Laterality Date  . COLPOSCOPY    . NASAL SINUS SURGERY  07/2015  . SHOULDER ARTHROSCOPY WITH BICEPS TENDON REPAIR Left 07/21/2019   Procedure: LEFT SHOULDER ARTHROSCOPY WITH BICEPS TENODESIS WITH DEBRIDEMENT OF LABRUM;  Surgeon: Leandrew Koyanagi, MD;  Location: [place];  Service: Orthopedics;  Laterality: Left;    There were no vitals filed for this visit.   Subjective Assessment - 08/20/19 1428    Subjective "I think things are going well. I do get some pain when I am doing the exercises"    Patient Stated Goals Use shoulder without pain and no limitations    Currently in Pain? Yes    Pain Score 1     Pain Orientation Left    Pain Descriptors / Indicators Aching    Pain Type Surgical pain              [facility] PT Assessment - 08/20/19 0001      Assessment   Medical Diagnosis SLAP repair, bicepstendonosis, W96.759F     Referring Provider (PT) Frankey Shown MD                         [facility] Adult PT Treatment/Exercise - 08/20/19 0001      Shoulder Exercises: Supine   Flexion AAROM;Strengthening;10 reps   using bil UE     Shoulder Exercises: Isometric Strengthening   Extension Supine   2 x 10 holding 2 seconds    External Rotation Supine   2 x 10 holding 2 seconds    Internal Rotation Supine      Manual Therapy   Manual Therapy Passive ROM;Joint mobilization;Soft tissue mobilization    Manual therapy comments MTPR along L upper trap levator scapulae, teres minor and middle delotid    Joint Mobilization glenohumeral joint grade III PA/ AP mobs     Soft tissue mobilization IASTM along middle deltoid and proxmial bicep with XFM    using biofreeze   Passive ROM workng into end range felxion/ abduction gradually with gentile distraction ossilations                  PT Education - 08/20/19 1510    Education Details Reviewed HEP and discused with AAROM  increased spasm it is ok to return back to doing the table slides for flexion    Person(s) Educated Patient    Methods Explanation;Verbal cues    Comprehension Verbalized understanding;Verbal cues required               PT Long Term Goals - 08/11/19 1239      PT LONG TERM GOAL #1   Title Pt will be independent in her HEP and progression.    Baseline issued initial program on 08/11/2019    Time 6    Period Weeks    Status New    Target Date 09/24/19      PT LONG TERM GOAL #2   Title Pt will be able to improve her L shoulder flexion to >/= 160 degrees to improve ADL's.    Baseline 118 degrees+    Time 6    Period Weeks    Status New    Target Date 09/24/19      PT LONG TERM GOAL #3   Title Pt will be able to perform basic ALD's with </=2.10  pain reported in L shoulder.    Baseline pain varies and increases with movements    Time 6    Period Weeks    Status New      PT LONG TERM GOAL #4   Title Pt will improve  her active L shoulder ER to >/=70 degrees to improve functional mobility.    Baseline PROM: 60 degrees    Time 6    Period Weeks    Status New    Target Date 09/24/19                 Plan - 08/20/19 1516    Clinical Impression Statement pt is 4 weeks post op. she continues to report consistency with her HEp but notes increased pain and spasm with AAROM. Continued working on shoulder ROM which she demonstrated limited ROM secondary to pain and guarding. she responded well to supine isometrics IR/ER and extension.    PT Treatment/Interventions ADLs/Self Care Home Management;Cryotherapy;Electrical Stimulation;Iontophoresis 4mg /ml Dexamethasone;Ultrasound;Gait training;Stair training;Functional mobility training;Therapeutic activities;Therapeutic exercise;Balance training;Neuromuscular re-education;Patient/family education;Manual techniques;Passive range of motion;Taping    PT Next Visit Plan AROM emphasis.  Conservative with ER AROM progression for the next 2 weeks. response to isometrics    PT Home Exercise Plan Access Code: [email or web address]    [email or web address] and Agree with Plan of Care Patient           Patient will benefit from skilled therapeutic intervention in order to improve the following deficits and impairments:  Pain, Postural dysfunction, Decreased activity tolerance, Decreased strength, Increased edema, Decreased range of motion  Visit Diagnosis: Stiffness of left shoulder, not elsewhere classified  Acute pain of left shoulder  Muscle weakness (generalized)  Localized edema     Problem List Patient Active Problem List   Diagnosis Date Noted  . Superior labrum anterior-to-posterior (SLAP) tear of left shoulder 06/04/2019  . Subclinical hypothyroidism 11/21/2016  . Vitamin D deficiency 11/21/2016  . Common migraine with intractable migraine 06/20/2016    [name] PT, DPT, LAT, ATC  08/20/19  4:11 PM      [facility] [address] [place], [place], [address] Phone: [phone]   Fax:  [phone]  Name: Elizabeth Contreras MRN: [id number] Date of Birth: 15-Feb-1980

## 2019-08-24 ENCOUNTER — Ambulatory Visit: Payer: BC Managed Care – PPO | Admitting: Physical Therapy

## 2019-08-24 ENCOUNTER — Other Ambulatory Visit: Payer: Self-pay

## 2019-08-24 ENCOUNTER — Encounter: Payer: Self-pay | Admitting: Physical Therapy

## 2019-08-24 DIAGNOSIS — M25612 Stiffness of left shoulder, not elsewhere classified: Secondary | ICD-10-CM

## 2019-08-24 DIAGNOSIS — M25512 Pain in left shoulder: Secondary | ICD-10-CM

## 2019-08-24 DIAGNOSIS — M6281 Muscle weakness (generalized): Secondary | ICD-10-CM | POA: Diagnosis not present

## 2019-08-24 DIAGNOSIS — R600 Localized edema: Secondary | ICD-10-CM

## 2019-08-24 NOTE — Therapy (Signed)
[place], [place], [address] Phone: [phone]   Fax:  [phone]  Physical Therapy Treatment  Patient Details  Name: Elizabeth Contreras MRN: [id number] Date of Birth: 01/30/1981 Referring Provider (PT): Frankey Shown MD   Encounter Date: 08/24/2019   PT End of Session - 08/24/19 1114    Visit Number 4    Number of Visits 12    Date for PT Re-Evaluation 09/24/19    PT Start Time 1107    PT Stop Time 1145    PT Time Calculation (min) 38 min    Activity Tolerance Patient tolerated treatment well;No increased pain    Behavior During Therapy WFL for tasks assessed/performed           Past Medical History:  Diagnosis Date  . Asthma   . Common migraine with intractable migraine 06/20/2016  . Herpes   . Migraine without aura   . PCOS (polycystic ovarian syndrome)   . PONV (postoperative nausea and vomiting)   . PTSD (post-traumatic stress disorder) 2015  . Sinusitis   . 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 Thyroid disease     Past Surgical History:  Procedure Laterality Date  . COLPOSCOPY    . NASAL SINUS SURGERY  07/2015  . SHOULDER ARTHROSCOPY WITH BICEPS TENDON REPAIR Left 07/21/2019   Procedure: LEFT SHOULDER ARTHROSCOPY WITH BICEPS TENODESIS WITH DEBRIDEMENT OF LABRUM;  Surgeon: Leandrew Koyanagi, MD;  Location: [place];  Service: Orthopedics;  Laterality: Left;    There were no vitals filed for this visit.   Subjective Assessment - 08/24/19 1113    Subjective Pt arriving to therapy reporting 1/10 pain in L shoulder. Pt reporting her HEP is going well.    Pertinent History SLAP repair, biceps tenodesis L shoulder on 07/21/2019, allergies    Limitations Lifting;House hold activities    Patient Stated Goals Use shoulder without pain and no limitations    Pain Score 1     Pain Location Shoulder    Pain Orientation Left    Pain Descriptors / Indicators Aching    Pain Type  Surgical pain    Pain Onset 1 to 4 weeks ago                             [facility] Adult PT Treatment/Exercise - 08/24/19 0001      Shoulder Exercises: Supine   External Rotation AAROM;Left;15 reps    Flexion AAROM;15 reps   using bil UE     Shoulder Exercises: Isometric Strengthening   Extension Supine   5 sec holds x 10 reps   External Rotation Supine;Limitations   5 second holds x 10 reps   Internal Rotation Supine   5 second holds x 10 reps     Shoulder Exercises: Stretch   Table Stretch - Flexion 5 reps;10 seconds    Table Stretch - External Rotation 5 reps;10 seconds      Manual Therapy   Manual Therapy Passive ROM;Joint mobilization;Soft tissue mobilization    Manual therapy comments MTPR along L upper trap levator scapulae, teres minor and middle delotid    Joint Mobilization glenohumeral joint grade III PA/ AP mobs     Soft tissue mobilization IASTM along middle deltoid and proxmial bicep   using biofreeze   Passive ROM workng into end range felxion/ abduction gradually with gentile distraction ossilations  PT Long Term Goals - 08/24/19 1120      PT LONG TERM GOAL #1   Title Pt will be independent in her HEP and progression.    Status On-going      PT LONG TERM GOAL #2   Title Pt will be able to improve her L shoulder flexion to >/= 160 degrees to improve ADL's.    Status On-going      PT LONG TERM GOAL #3   Title Pt will be able to perform basic ALD's with </=2.10  pain reported in L shoulder.    Status On-going      PT LONG TERM GOAL #4   Title Pt will improve her active L shoulder ER to >/=70 degrees to improve functional mobility.    Status On-going                 Plan - 08/24/19 1116    Clinical Impression Statement Pt arriving today with 1/10 pain in Left shoulder. Pt with improvements in ROM and overall pain. Pt compliant with her HEP. Pt still reporting pain first thing in the mornings. Continue  skilled PT to progress per pt's protocol.    Examination-Activity Limitations Carry;Lift;Dressing    Examination-Participation Restrictions Cleaning;Other    Stability/Clinical Decision Making Stable/Uncomplicated    Rehab Potential Good    PT Frequency 2x / week    PT Duration 6 weeks    PT Treatment/Interventions ADLs/Self Care Home Management;Cryotherapy;Electrical Stimulation;Iontophoresis 4mg /ml Dexamethasone;Ultrasound;Gait training;Stair training;Functional mobility training;Therapeutic activities;Therapeutic exercise;Balance training;Neuromuscular re-education;Patient/family education;Manual techniques;Passive range of motion;Taping    PT Next Visit Plan AROM emphasis.  Conservative with ER AROM progression for the next 2 weeks. response to isometrics    PT Home Exercise Plan Access Code: [email or web address]    [email or web address] and Agree with Plan of Care Patient           Patient will benefit from skilled therapeutic intervention in order to improve the following deficits and impairments:  Pain, Postural dysfunction, Decreased activity tolerance, Decreased strength, Increased edema, Decreased range of motion  Visit Diagnosis: Stiffness of left shoulder, not elsewhere classified  Acute pain of left shoulder  Muscle weakness (generalized)  Localized edema     Problem List Patient Active Problem List   Diagnosis Date Noted  . Superior labrum anterior-to-posterior (SLAP) tear of left shoulder 06/04/2019  . Subclinical hypothyroidism 11/21/2016  . Vitamin D deficiency 11/21/2016  . Common migraine with intractable migraine 06/20/2016    Oretha Caprice, PT, MPT 08/24/2019, 11:46 AM  [facility] [address] [place], [place], [address] Phone: [phone]   Fax:  [phone]  Name: Elizabeth Contreras MRN: [id number] Date of Birth: [birth year]

## 2019-08-25 ENCOUNTER — Ambulatory Visit: Payer: BC Managed Care – PPO | Admitting: Orthopaedic Surgery

## 2019-08-26 ENCOUNTER — Encounter: Payer: Self-pay | Admitting: Physical Therapy

## 2019-08-26 ENCOUNTER — Ambulatory Visit: Payer: BC Managed Care – PPO | Admitting: Physical Therapy

## 2019-08-26 ENCOUNTER — Other Ambulatory Visit: Payer: Self-pay

## 2019-08-26 DIAGNOSIS — M6281 Muscle weakness (generalized): Secondary | ICD-10-CM

## 2019-08-26 DIAGNOSIS — M25512 Pain in left shoulder: Secondary | ICD-10-CM | POA: Diagnosis not present

## 2019-08-26 DIAGNOSIS — R600 Localized edema: Secondary | ICD-10-CM | POA: Diagnosis not present

## 2019-08-26 DIAGNOSIS — M25612 Stiffness of left shoulder, not elsewhere classified: Secondary | ICD-10-CM

## 2019-08-26 NOTE — Therapy (Signed)
[place], [place], [address] Phone: [phone]   Fax:  [phone]  Physical Therapy Treatment  Patient Details  Name: Elizabeth Contreras MRN: [id number] Date of Birth: May 09, 1980 Referring Provider (PT): Frankey Shown MD   Encounter Date: 08/26/2019   PT End of Session - 08/26/19 1103    Visit Number 5    Number of Visits 12    Date for PT Re-Evaluation 09/24/19    PT Start Time 0933    PT Stop Time 1013    PT Time Calculation (min) 40 min    Activity Tolerance Patient tolerated treatment well;No increased pain    Behavior During Therapy WFL for tasks assessed/performed           Past Medical History:  Diagnosis Date  . Asthma   . Common migraine with intractable migraine 06/20/2016  . Herpes   . Migraine without aura   . PCOS (polycystic ovarian syndrome)   . PONV (postoperative nausea and vomiting)   . PTSD (post-traumatic stress disorder) 2015  . Sinusitis   . 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 Thyroid disease     Past Surgical History:  Procedure Laterality Date  . COLPOSCOPY    . NASAL SINUS SURGERY  07/2015  . SHOULDER ARTHROSCOPY WITH BICEPS TENDON REPAIR Left 07/21/2019   Procedure: LEFT SHOULDER ARTHROSCOPY WITH BICEPS TENODESIS WITH DEBRIDEMENT OF LABRUM;  Surgeon: Leandrew Koyanagi, MD;  Location: [place];  Service: Orthopedics;  Laterality: Left;    There were no vitals filed for this visit.                      [facility] Adult PT Treatment/Exercise - 08/26/19 0001      Exercises   Exercises Shoulder      Shoulder Exercises: Supine   External Rotation AAROM;Left;20 reps    Flexion AAROM;20 reps   using bil UE     Shoulder Exercises: Standing   Other Standing Exercises wall stretch x 10      Manual Therapy   Manual Therapy Passive ROM;Joint mobilization;Soft tissue mobilization    Manual therapy comments MTPR along L upper trap levator  scapulae, teres minor and middle delotid    Joint Mobilization glenohumeral joint grade III PA/ AP mobs     Soft tissue mobilization IASTM along middle deltoid and proxmial bicep   using biofreeze   Passive ROM ossiciliations at end ranges of ER and IR                        PT Long Term Goals - 08/24/19 1120      PT LONG TERM GOAL #1   Title Pt will be independent in her HEP and progression.    Status On-going      PT LONG TERM GOAL #2   Title Pt will be able to improve her L shoulder flexion to >/= 160 degrees to improve ADL's.    Status On-going      PT LONG TERM GOAL #3   Title Pt will be able to perform basic ALD's with </=2.10  pain reported in L shoulder.    Status On-going      PT LONG TERM GOAL #4   Title Pt will improve her active L shoulder ER to >/=70 degrees to improve functional mobility.    Status On-going  Plan - 08/26/19 1104    Clinical Impression Statement Pt tolerating exercises well focusing on shoulder ROM. Pt reporting less stiffness and pain following IASTM and PROM/mobs. Continue skilled PT.    Examination-Activity Limitations Carry;Lift;Dressing    Examination-Participation Restrictions Cleaning;Other    Rehab Potential Good    PT Duration 6 weeks    PT Treatment/Interventions ADLs/Self Care Home Management;Cryotherapy;Electrical Stimulation;Iontophoresis 4mg /ml Dexamethasone;Ultrasound;Gait training;Stair training;Functional mobility training;Therapeutic activities;Therapeutic exercise;Balance training;Neuromuscular re-education;Patient/family education;Manual techniques;Passive range of motion;Taping    PT Next Visit Plan AROM emphasis.  Conservative with ER AROM progression for the next 2 weeks. response to isometrics    PT Home Exercise Plan Access Code: [email or web address]    [email or web address] and Agree with Plan of Care Patient           Patient will benefit from skilled therapeutic intervention in order to improve the  following deficits and impairments:  Pain, Postural dysfunction, Decreased activity tolerance, Decreased strength, Increased edema, Decreased range of motion  Visit Diagnosis: Stiffness of left shoulder, not elsewhere classified  Acute pain of left shoulder  Muscle weakness (generalized)  Localized edema     Problem List Patient Active Problem List   Diagnosis Date Noted  . Superior labrum anterior-to-posterior (SLAP) tear of left shoulder 06/04/2019  . Subclinical hypothyroidism 11/21/2016  . Vitamin D deficiency 11/21/2016  . Common migraine with intractable migraine 06/20/2016    Elizabeth Contreras, PT, MPT 08/26/2019, 11:18 AM  [facility] [address] [place], [place], [address] Phone: [phone]   Fax:  [phone]  Name: JAKELIN TAUSSIG MRN: [id number] Date of Birth: [birth year]

## 2019-08-30 ENCOUNTER — Encounter: Payer: BC Managed Care – PPO | Admitting: Physical Therapy

## 2019-09-02 ENCOUNTER — Encounter: Payer: Self-pay | Admitting: Orthopaedic Surgery

## 2019-09-02 ENCOUNTER — Ambulatory Visit (INDEPENDENT_AMBULATORY_CARE_PROVIDER_SITE_OTHER): Payer: BC Managed Care – PPO | Admitting: Orthopaedic Surgery

## 2019-09-02 DIAGNOSIS — S43432A Superior glenoid labrum lesion of left shoulder, initial encounter: Secondary | ICD-10-CM

## 2019-09-02 NOTE — Progress Notes (Signed)
   Post-Op Visit Note   Patient: Elizabeth Contreras           Date of Birth: 07-21-1980           MRN: [id number] Visit Date: 09/02/2019 PCP: Billie Ruddy, MD   Assessment & Plan:  Chief Complaint:  Chief Complaint  Patient presents with  . Left Shoulder - Pain   Visit Diagnoses:  1. Superior labrum anterior-to-posterior (SLAP) tear of left shoulder     Plan: Elizabeth Contreras is 6 weeks status post left shoulder scope with biceps tenodesis.  She is progressing with physical therapy which she is attending twice a week.  She reports no symptoms of her old pain.  Surgical scars are fully healed.  She is having moderate restriction in range of motion with forward flexion, external rotation, internal rotation.  I think this is mainly limited due to pain and not as much adhesive capsulitis.  I did offer an intra-articular cortisone injection today but she declined and would like to work on range of motion herself at home.  I would like to recheck her in 4weeks.  Questions encouraged and answered.  Follow-Up Instructions: Return in about 4 weeks (around 09/30/2019).   Orders:  No orders of the defined types were placed in this encounter.  No orders of the defined types were placed in this encounter.   Imaging: No results found.  PMFS History: Patient Active Problem List   Diagnosis Date Noted  . Superior labrum anterior-to-posterior (SLAP) tear of left shoulder 06/04/2019  . Subclinical hypothyroidism 11/21/2016  . Vitamin D deficiency 11/21/2016  . Common migraine with intractable migraine 06/20/2016   Past Medical History:  Diagnosis Date  . Asthma   . Common migraine with intractable migraine 06/20/2016  . Herpes   . Migraine without aura   . PCOS (polycystic ovarian syndrome)   . PONV (postoperative nausea and vomiting)   . PTSD (post-traumatic stress disorder) 2015  . Sinusitis   . 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 Thyroid disease     Family History    Problem Relation Age of Onset  . Migraines Mother   . Aneurysm Mother   . Cancer Maternal Aunt        ovarian  . Cancer Paternal Uncle        colon    Past Surgical History:  Procedure Laterality Date  . COLPOSCOPY    . NASAL SINUS SURGERY  07/2015  . SHOULDER ARTHROSCOPY WITH BICEPS TENDON REPAIR Left 07/21/2019   Procedure: LEFT SHOULDER ARTHROSCOPY WITH BICEPS TENODESIS WITH DEBRIDEMENT OF LABRUM;  Surgeon: Leandrew Koyanagi, MD;  Location: [facility];  Service: Orthopedics;  Laterality: Left;   Social History   Occupational History    Comment: unemployed  Tobacco Use  . Smoking status: Never Smoker  . Smokeless tobacco: Never Used  Vaping Use  . Vaping Use: Never used  Substance and Sexual Activity  . Alcohol use: Yes    Comment: rarely  . Drug use: No  . Sexual activity: Yes    Partners: Male    Birth control/protection: None

## 2019-09-03 ENCOUNTER — Other Ambulatory Visit: Payer: Self-pay

## 2019-09-03 ENCOUNTER — Ambulatory Visit (INDEPENDENT_AMBULATORY_CARE_PROVIDER_SITE_OTHER): Payer: BC Managed Care – PPO | Admitting: Physical Therapy

## 2019-09-03 ENCOUNTER — Encounter: Payer: Self-pay | Admitting: Physical Therapy

## 2019-09-03 DIAGNOSIS — R600 Localized edema: Secondary | ICD-10-CM | POA: Diagnosis not present

## 2019-09-03 DIAGNOSIS — M6281 Muscle weakness (generalized): Secondary | ICD-10-CM | POA: Diagnosis not present

## 2019-09-03 DIAGNOSIS — M25512 Pain in left shoulder: Secondary | ICD-10-CM | POA: Diagnosis not present

## 2019-09-03 DIAGNOSIS — M25612 Stiffness of left shoulder, not elsewhere classified: Secondary | ICD-10-CM | POA: Diagnosis not present

## 2019-09-03 NOTE — Therapy (Signed)
[place], [place], [address] Phone: [phone]   Fax:  [phone]  Physical Therapy Treatment  Patient Details  Name: Elizabeth Contreras MRN: [id number] Date of Birth: [birth year] Referring Provider (PT): Frankey Shown MD   Encounter Date: 09/03/2019   PT End of Session - 09/03/19 0952    Visit Number 6    Number of Visits 12    Date for PT Re-Evaluation 09/24/19    PT Start Time 0945    PT Stop Time 4268    PT Time Calculation (min) 30 min    Activity Tolerance Patient tolerated treatment well;No increased pain    Behavior During Therapy WFL for tasks assessed/performed           Past Medical History:  Diagnosis Date  . Asthma   . Common migraine with intractable migraine 06/20/2016  . Herpes   . Migraine without aura   . PCOS (polycystic ovarian syndrome)   . PONV (postoperative nausea and vomiting)   . PTSD (post-traumatic stress disorder) 2015  . Sinusitis   . 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 Thyroid disease     Past Surgical History:  Procedure Laterality Date  . COLPOSCOPY    . NASAL SINUS SURGERY  07/2015  . SHOULDER ARTHROSCOPY WITH BICEPS TENDON REPAIR Left 07/21/2019   Procedure: LEFT SHOULDER ARTHROSCOPY WITH BICEPS TENODESIS WITH DEBRIDEMENT OF LABRUM;  Surgeon: Leandrew Koyanagi, MD;  Location: [facility];  Service: Orthopedics;  Laterality: Left;    There were no vitals filed for this visit.   Subjective Assessment - 09/03/19 0947    Subjective Pt arriving to therapy 15 minutes late. Pt stating she got her times mixed up. Pt reporting no pain upon arrival. Pt reported seeing Dr. Erlinda Hong and is currently holding off on shoulder injection.    Pertinent History SLAP repair, biceps tenodesis L shoulder on 07/21/2019, allergies    Limitations Lifting;House hold activities    Patient Stated Goals Use shoulder without pain and no limitations    Currently in Pain?  No/denies    Pain Onset 1 to 4 weeks ago                             [facility] PT Treatment/Exercise - 09/03/19 0001      Exercises   Exercises Shoulder      Shoulder Exercises: Supine   External Rotation AAROM;Left;20 reps    Flexion AAROM;20 reps   using bil UE     Shoulder Exercises: Seated   Other Seated Exercises scapular retraction x 5 holding 5 seconds each      Shoulder Exercises: Pulleys   Flexion 3 minutes      Shoulder Exercises: Stretch   Table Stretch -Flexion Limitations 10 reps x 5 second holds    Table Stretch - External Rotation Limitations 10 reps x 5 second holds      Manual Therapy   Manual Therapy Passive ROM;Joint mobilization;Soft tissue mobilization    Manual therapy comments rock tape used to inhib L upper trap    Joint Mobilization glenohumeral joint grade III PA/ AP mobs     Soft tissue mobilization IASTM along middle deltoid and proxmial bicep   using biofreeze   Passive ROM gentle distraction and ossicillations at end ranges of flexion and abd  PT Education - 09/03/19 1025    Education Details instructions in how to remove [employer]) Educated Patient    Methods Explanation;Demonstration    Comprehension Verbalized understanding               PT Long Term Goals - 09/03/19 0952      PT LONG TERM GOAL #1   Title Pt will be independent in her HEP and progression.    Baseline issued initial program on 08/11/2019    Time 6    Period Weeks    Status On-going      PT LONG TERM GOAL #2   Title Pt will be able to improve her L shoulder flexion to >/= 160 degrees to improve ADL's.    Status On-going      PT LONG TERM GOAL #3   Title Pt will be able to perform basic ALD's with </=2.10  pain reported in L shoulder.    Baseline pain varies and increases with movements    Status On-going      PT LONG TERM GOAL #4   Title Pt will improve her active L shoulder ER to >/=70 degrees to  improve functional mobility.    Status On-going                 Plan - 09/03/19 8280    Clinical Impression Statement Pt reporting no new complaints upon arrival. Pt tolerating exercises well. Pt still presenting with stiffness with end range mobility. IASTM and PROM performed. Rock tape applied to inhib L upper trap. Conitnue skilled PT to progress toward goals set.    Examination-Activity Limitations Carry;Lift;Dressing    Examination-Participation Restrictions Cleaning;Other    Stability/Clinical Decision Making Stable/Uncomplicated    Rehab Potential Good    PT Frequency 2x / week    PT Duration 6 weeks    PT Treatment/Interventions ADLs/Self Care Home Management;Cryotherapy;Electrical Stimulation;Iontophoresis 4mg /ml Dexamethasone;Ultrasound;Gait training;Stair training;Functional mobility training;Therapeutic activities;Therapeutic exercise;Balance training;Neuromuscular re-education;Patient/family education;Manual techniques;Passive range of motion;Taping    PT Next Visit Plan AROM emphasis.  Conservative with ER AROM progression for the next 2 weeks. response to isometrics    PT Home Exercise Plan Access Code: [email or web address]           Patient will benefit from skilled therapeutic intervention in order to improve the following deficits and impairments:  Pain, Postural dysfunction, Decreased activity tolerance, Decreased strength, Increased edema, Decreased range of motion  Visit Diagnosis: Stiffness of left shoulder, not elsewhere classified  Acute pain of left shoulder  Muscle weakness (generalized)  Localized edema     Problem List Patient Active Problem List   Diagnosis Date Noted  . Superior labrum anterior-to-posterior (SLAP) tear of left shoulder 06/04/2019  . Subclinical hypothyroidism 11/21/2016  . Vitamin D deficiency 11/21/2016  . Common migraine with intractable migraine 06/20/2016    Oretha Caprice, PT, MPT 09/03/2019, 10:27 AM  [facility] [address] [place], [place], [address] Phone: [phone]   Fax:  [phone]  Name: Elizabeth Contreras MRN: [id number] Date of Birth: 14-May-1980

## 2019-09-07 ENCOUNTER — Encounter: Payer: Self-pay | Admitting: Physical Therapy

## 2019-09-07 ENCOUNTER — Ambulatory Visit: Payer: BC Managed Care – PPO | Admitting: Physical Therapy

## 2019-09-07 ENCOUNTER — Other Ambulatory Visit: Payer: Self-pay

## 2019-09-07 DIAGNOSIS — M6281 Muscle weakness (generalized): Secondary | ICD-10-CM | POA: Diagnosis not present

## 2019-09-07 DIAGNOSIS — M25512 Pain in left shoulder: Secondary | ICD-10-CM

## 2019-09-07 DIAGNOSIS — R600 Localized edema: Secondary | ICD-10-CM | POA: Diagnosis not present

## 2019-09-07 DIAGNOSIS — M25612 Stiffness of left shoulder, not elsewhere classified: Secondary | ICD-10-CM

## 2019-09-07 NOTE — Therapy (Signed)
[place], [place], [address] Phone: [phone]   Fax:  [phone]  Physical Therapy Treatment  Patient Details  Name: Elizabeth Contreras MRN: [id number] Date of Birth: 1980/07/12 Referring Provider (PT): Frankey Shown MD   Encounter Date: 09/07/2019   PT End of Session - 09/07/19 1010    Visit Number 7    Number of Visits 12    Date for PT Re-Evaluation 09/24/19    PT Start Time 0931    PT Stop Time 1011    PT Time Calculation (min) 40 min    Activity Tolerance Patient tolerated treatment well;No increased pain    Behavior During Therapy WFL for tasks assessed/performed           Past Medical History:  Diagnosis Date  . Asthma   . Common migraine with intractable migraine 06/20/2016  . Herpes   . Migraine without aura   . PCOS (polycystic ovarian syndrome)   . PONV (postoperative nausea and vomiting)   . PTSD (post-traumatic stress disorder) 2015  . Sinusitis   . 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 Thyroid disease     Past Surgical History:  Procedure Laterality Date  . COLPOSCOPY    . NASAL SINUS SURGERY  07/2015  . SHOULDER ARTHROSCOPY WITH BICEPS TENDON REPAIR Left 07/21/2019   Procedure: LEFT SHOULDER ARTHROSCOPY WITH BICEPS TENODESIS WITH DEBRIDEMENT OF LABRUM;  Surgeon: Leandrew Koyanagi, MD;  Location: [place];  Service: Orthopedics;  Laterality: Left;    There were no vitals filed for this visit.   Subjective Assessment - 09/07/19 0934    Subjective doing well; shoulder still hurts at end ranges but overall no issues    Pertinent History SLAP repair, biceps tenodesis L shoulder on 07/21/2019, allergies    Limitations Lifting;House hold activities    Patient Stated Goals Use shoulder without pain and no limitations    Currently in Pain? No/denies    Pain Onset 1 to 4 weeks ago              [facility] PT Assessment - 09/07/19 0936      Assessment   Medical  Diagnosis SLAP repair, bicepstendonosis, U44.034V    Referring Provider (PT) Frankey Shown MD                         [facility] Adult PT Treatment/Exercise - 09/07/19 0934      Shoulder Exercises: Supine   Flexion AAROM;20 reps   3# bar   Other Supine Exercises chest press 20 reps with 3# bar      Shoulder Exercises: Standing   External Rotation Left;20 reps;Theraband    Theraband Level (Shoulder External Rotation) Level 1 (Yellow)    Internal Rotation Both;20 reps;AAROM   3# bar   Flexion Left;20 reps   no weight to 90 deg   ABduction AROM;20 reps    ABduction Limitations 60-90 deg range which is pt's current tolerance    Extension Both;AAROM;20 reps   3# bar   Row Both;20 reps;Theraband    Theraband Level (Shoulder Row) Level 3 (Green)    Other Standing Exercises extension with L1 band x 20 reps    Other Standing Exercises internal rotation x 20 reps; Lt L1 band      Shoulder Exercises: Pulleys   Flexion 2 minutes    Scaption 2 minutes      Manual Therapy  Joint Mobilization glenohumeral joint grade III PA/ AP mobs     Passive ROM gentle distraction and ossicillations at end ranges of flexion and abd; PROM to Lt shoulder all directions to tolerance                       PT Long Term Goals - 09/03/19 2563      PT LONG TERM GOAL #1   Title Pt will be independent in her HEP and progression.    Baseline issued initial program on 08/11/2019    Time 6    Period Weeks    Status On-going      PT LONG TERM GOAL #2   Title Pt will be able to improve her L shoulder flexion to >/= 160 degrees to improve ADL's.    Status On-going      PT LONG TERM GOAL #3   Title Pt will be able to perform basic ALD's with </=2.10  pain reported in L shoulder.    Baseline pain varies and increases with movements    Status On-going      PT LONG TERM GOAL #4   Title Pt will improve her active L shoulder ER to >/=70 degrees to improve functional mobility.    Status On-going                  Plan - 09/07/19 1012    Clinical Impression Statement Pt tolerated session well today adding AROM and gentle strengthening exercises. Overall doing well and will continue to benefit from PT to maximize function.    Examination-Activity Limitations Carry;Lift;Dressing    Examination-Participation Restrictions Cleaning;Other    Stability/Clinical Decision Making Stable/Uncomplicated    Rehab Potential Good    PT Frequency 2x / week    PT Duration 6 weeks    PT Treatment/Interventions ADLs/Self Care Home Management;Cryotherapy;Electrical Stimulation;Iontophoresis 4mg /ml Dexamethasone;Ultrasound;Gait training;Stair training;Functional mobility training;Therapeutic activities;Therapeutic exercise;Balance training;Neuromuscular re-education;Patient/family education;Manual techniques;Passive range of motion;Taping    PT Next Visit Plan continue with manual/PROM, gentle strengthening to tolerance    PT Home Exercise Plan Access Code: [email or web address]           Patient will benefit from skilled therapeutic intervention in order to improve the following deficits and impairments:  Pain, Postural dysfunction, Decreased activity tolerance, Decreased strength, Increased edema, Decreased range of motion  Visit Diagnosis: Stiffness of left shoulder, not elsewhere classified  Acute pain of left shoulder  Muscle weakness (generalized)  Localized edema     Problem List Patient Active Problem List   Diagnosis Date Noted  . Superior labrum anterior-to-posterior (SLAP) tear of left shoulder 06/04/2019  . Subclinical hypothyroidism 11/21/2016  . Vitamin D deficiency 11/21/2016  . Common migraine with intractable migraine 06/20/2016      Laureen Abrahams, PT, DPT 09/07/19 10:14 AM   [facility] [address] [place], [place], [address] Phone: [phone]   Fax:  [phone]  Name: Elizabeth Contreras MRN: [id number] Date of Birth:  03-10-1980

## 2019-09-16 ENCOUNTER — Encounter: Payer: Self-pay | Admitting: Rehabilitative and Restorative Service Providers"

## 2019-09-16 ENCOUNTER — Ambulatory Visit: Payer: BC Managed Care – PPO | Admitting: Rehabilitative and Restorative Service Providers"

## 2019-09-16 ENCOUNTER — Other Ambulatory Visit: Payer: Self-pay

## 2019-09-16 DIAGNOSIS — M6281 Muscle weakness (generalized): Secondary | ICD-10-CM

## 2019-09-16 DIAGNOSIS — M25612 Stiffness of left shoulder, not elsewhere classified: Secondary | ICD-10-CM | POA: Diagnosis not present

## 2019-09-16 DIAGNOSIS — R600 Localized edema: Secondary | ICD-10-CM

## 2019-09-16 NOTE — Patient Instructions (Signed)
Access Code: [email or web address] URL: [email or web address] Date: 09/16/2019 Prepared by: Vista Mink  Exercises Standing Shoulder Internal Rotation Stretch with Hands Behind Back - 5 x daily - 7 x weekly - 1 sets - 10 reps - 10 hold

## 2019-09-16 NOTE — Therapy (Signed)
[place], [place], [address] Phone: [phone]   Fax:  [phone]  Physical Therapy Treatment  Patient Details  Name: Elizabeth Contreras MRN: [id number] Date of Birth: 1980/07/11 Referring Provider (PT): Frankey Shown MD   Encounter Date: 09/16/2019   PT End of Session - 09/16/19 1019    Visit Number 8    Number of Visits 12    Date for PT Re-Evaluation 09/24/19    PT Start Time 0931    PT Stop Time 1013    PT Time Calculation (min) 42 min    Activity Tolerance Patient tolerated treatment well;No increased pain    Behavior During Therapy [facility] for tasks assessed/performed           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   Subjective Assessment - 09/16/19 1017    Subjective Rachel notes improved AROM although she still "feels stiff."    Pertinent History SLAP repair, biceps tenodesis L shoulder on 07/21/2019, allergies    Limitations Lifting;House hold activities    Patient Stated Goals Use shoulder without pain and no limitations    Currently in Pain? No/denies    Pain Onset 1 to 4 weeks ago    Aggravating Factors  End range AROM    Pain Relieving Factors Ice and exercises    Effect of  Pain on Daily Activities Affects all L UE use, particularly reaching and overhead function              [facility] PT Assessment - 09/16/19 0001      AROM   Left Shoulder Flexion 130 Degrees    Left Shoulder Internal Rotation 30 Degrees    Left Shoulder External Rotation 50 Degrees    Left Shoulder Horizontal ADduction 40 Degrees                         [facility] Adult PT Treatment/Exercise - 09/16/19 0001      Exercises   Exercises Shoulder      Shoulder Exercises: Supine   External Rotation AROM;Left;10 reps   10 seconds   Internal Rotation AROM;Left;10 reps   10 seconds   Flexion AROM;Left;10 reps   10 seconds     Shoulder Exercises: Standing   External Rotation Strengthening;Left;10 reps   Red slow eccentrics   Theraband Level (Shoulder External Rotation) Level 2 (Red)    Internal Rotation Strengthening;Left;10 reps   Red slow eccentrics   Theraband Level (Shoulder Internal Rotation) Level 2 (Red)    Row Strengthening;Both;20 reps;Theraband    Theraband Level (Shoulder Row) Level 3 (Green)    Other Standing Exercises Thumb up the back stretch  10X 10 seconds      Shoulder Exercises: Pulleys   Flexion Other (comment)   10X 10 seconds (protract 1st)   Scaption Other (comment)   10X 10 seconds (protract 1st)                 PT Education - 09/16/19 1018    Education Details Reviewed HEP with emphasis on stretching.  Added thumb up the back stretch to address IR AROM deficit.    Person(s) Educated Patient    Methods Explanation;Demonstration;Tactile cues;Verbal cues;Handout    Comprehension Verbal cues required;Need further instruction;Returned demonstration;Verbalized understanding;Tactile cues required               PT Long Term Goals - 09/16/19 1019      PT LONG TERM GOAL #1   Title Pt will be independent in her HEP and progression.    Baseline issued initial program on 08/11/2019    Time 6    Period Weeks    Status On-going      PT LONG  TERM GOAL #2   Title Pt will be able to improve her L shoulder flexion to >/= 160 degrees to improve ADL's.    Status On-going      PT LONG TERM GOAL #3   Title Pt will be able to perform basic ALD's with </=2.10  pain reported in L shoulder.    Baseline pain varies and increases with movements    Status Partially Met      PT LONG TERM GOAL #4   Title Pt will improve her active L shoulder ER to >/=70 degrees to improve functional mobility.    Status On-going                 Plan - 09/16/19 1020    Clinical Impression Statement Ladonna has improved AROM as compared to the last time I saw her.  Although improved, AROM is 67.5% of her long-term goal (80% expected at this point post-surgery).  I emphasized the importance of 2-3X/day HEP compliance with heavy emphasis on capsular stretching to continue to move towards 90-100% long-term AROM and function.    Examination-Activity Limitations Carry;Lift;Dressing    Examination-Participation Restrictions Cleaning;Other    Stability/Clinical Decision Making Stable/Uncomplicated    Rehab Potential Good    PT Frequency 2x / week    PT Duration 6 weeks    PT Treatment/Interventions ADLs/Self Care Home Management;Cryotherapy;Electrical Stimulation;Iontophoresis 70m/ml Dexamethasone;Ultrasound;Gait training;Stair training;Functional mobility training;Therapeutic activities;Therapeutic exercise;Balance training;Neuromuscular re-education;Patient/family education;Manual techniques;Passive range of motion;Taping    PT Next Visit Plan AROM, appropriate strength progressions    PT Home Exercise Plan Access Code: [email or web address]  Added thumb up the back to HEP.    Consulted and Agree with Plan of Care Patient           Patient will benefit from skilled therapeutic intervention in order to improve the following deficits and impairments:  Pain, Postural dysfunction, Decreased activity tolerance, Decreased strength, Increased edema, Decreased range of  motion  Visit Diagnosis: Stiffness of left shoulder, not elsewhere classified  Muscle weakness (generalized)  Localized edema     Problem List Patient Active Problem List   Diagnosis Date Noted   Superior labrum anterior-to-posterior (SLAP) tear of left shoulder 06/04/2019   Subclinical hypothyroidism 11/21/2016   Vitamin D deficiency 11/21/2016   Common migraine with intractable migraine 06/20/2016    RFarley LyPT, MPT 09/16/2019, 10:22 AM  [facility] [address] [address]: [phone]  Fax:  [phone]  Name: BRYNLEIGH SEQUEIRA MRN: [id number] Date of Birth: Sep 25, 1980

## 2019-09-21 ENCOUNTER — Encounter: Payer: Self-pay | Admitting: Rehabilitative and Restorative Service Providers"

## 2019-09-21 ENCOUNTER — Ambulatory Visit: Payer: BC Managed Care – PPO | Admitting: Rehabilitative and Restorative Service Providers"

## 2019-09-21 ENCOUNTER — Other Ambulatory Visit: Payer: Self-pay

## 2019-09-21 DIAGNOSIS — M25612 Stiffness of left shoulder, not elsewhere classified: Secondary | ICD-10-CM

## 2019-09-21 DIAGNOSIS — M6281 Muscle weakness (generalized): Secondary | ICD-10-CM | POA: Diagnosis not present

## 2019-09-21 DIAGNOSIS — R600 Localized edema: Secondary | ICD-10-CM | POA: Diagnosis not present

## 2019-09-21 NOTE — Patient Instructions (Signed)
Access Code: [email or web address] URL: [email or web address] Date: 09/21/2019 Prepared by: Vista Mink  Exercises Shoulder External Rotation with Anchored Resistance with Towel Under Elbow - 1 x daily - 7 x weekly - 2 sets - 10 reps - 3 seconds hold Standing Shoulder Internal Rotation with Anchored Resistance - 1 x daily - 7 x weekly - 2 sets - 10 reps - 3 seconds hold

## 2019-09-21 NOTE — Therapy (Signed)
[facility] [place], [place], [address] Phone: [phone]   Fax:  [phone]  Physical Therapy Treatment  Patient Details  Name: Elizabeth Contreras MRN: [id number] Date of Birth: 09-Dec-1980 Referring Provider (PT): Frankey Shown MD   Encounter Date: 09/21/2019   PT End of Session - 09/21/19 1317    Visit Number 9    Number of Visits 12    Date for PT Re-Evaluation 09/24/19    PT Start Time 0930    PT Stop Time 2595    PT Time Calculation (min) 44 min    Activity Tolerance Patient tolerated treatment well;No increased pain    Behavior During Therapy WFL for tasks assessed/performed           Past Medical History:  Diagnosis Date  . Asthma   . Common migraine with intractable migraine 06/20/2016  . Herpes   . Migraine without aura   . PCOS (polycystic ovarian syndrome)   . PONV (postoperative nausea and vomiting)   . PTSD (post-traumatic stress disorder) 2015  . Sinusitis   . 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 Thyroid disease     Past Surgical History:  Procedure Laterality Date  . COLPOSCOPY    . NASAL SINUS SURGERY  07/2015  . SHOULDER ARTHROSCOPY WITH BICEPS TENDON REPAIR Left 07/21/2019   Procedure: LEFT SHOULDER ARTHROSCOPY WITH BICEPS TENODESIS WITH DEBRIDEMENT OF LABRUM;  Surgeon: Leandrew Koyanagi, MD;  Location: [facility];  Service: Orthopedics;  Laterality: Left;    There were no vitals filed for this visit.   Subjective Assessment - 09/21/19 1315    Subjective AROM continues to improve along with pain.    Pertinent History SLAP repair, biceps tenodesis L shoulder on 07/21/2019, allergies    Limitations Lifting;House hold activities    Patient Stated Goals Use shoulder without pain and no limitations    Currently in Pain? No/denies    Pain Onset 1 to 4 weeks ago              [facility] PT Assessment - 09/21/19 0001      AROM   Left Shoulder Flexion 150 Degrees    Left  Shoulder Internal Rotation 35 Degrees    Left Shoulder External Rotation 60 Degrees    Left Shoulder Horizontal ADduction 55 Degrees                         [facility] Adult PT Treatment/Exercise - 09/21/19 0001      Exercises   Exercises Shoulder      Shoulder Exercises: Supine   External Rotation AROM;Left;10 reps   10 seconds   Internal Rotation AROM;Left;10 reps   10 seconds   Flexion AROM;Left;10 reps   10 seconds     Shoulder Exercises: Standing   External Rotation Strengthening;Left;10 reps   Red slow eccentrics   Theraband Level (Shoulder External Rotation) Level 2 (Red)    Internal Rotation Strengthening;Left;10 reps   Red slow eccentrics   Theraband Level (Shoulder Internal Rotation) Level 2 (Red)    Row Strengthening;Both;20 reps;Theraband    Theraband Level (Shoulder Row) Level 3 (Green)    Other Standing Exercises Thumb up the back stretch 10X 10 seconds      Shoulder Exercises: Pulleys   Flexion Other (comment)   10X 10 seconds (protract 1st)   Scaption Other (comment)   10X 10 seconds (protract 1st)  PT Education - 09/21/19 1316    Education Details Reviewed HEP and added strengthening to HEP 1X/day.    Person(s) Educated Patient    Methods Explanation;Demonstration;Verbal cues;Handout;Tactile cues    Comprehension Returned demonstration;Verbal cues required;Need further instruction;Tactile cues required;Verbalized understanding               PT Long Term Goals - 09/21/19 1316      PT LONG TERM GOAL #1   Title Pt will be independent in her HEP and progression.    Baseline issued initial program on 08/11/2019    Time 6    Period Weeks    Status On-going      PT LONG TERM GOAL #2   Title Pt will be able to improve her L shoulder flexion to >/= 160 degrees to improve ADL's.    Baseline Now 150    Status On-going      PT LONG TERM GOAL #3   Title Pt will be able to perform basic ALD's with </=2.10  pain reported in L  shoulder.    Baseline pain varies and increases with movements    Status Partially Met      PT LONG TERM GOAL #4   Title Pt will improve her active L shoulder ER to >/=70 degrees to improve functional mobility.    Status On-going                 Plan - 09/21/19 1317    Clinical Impression Statement Elizabeth Contreras continues to make progress with her AROM which remains the highest priority with her clinic and home program.  Strengthening was added to her HEP on a 1X/day basis.  Increased emphasis on IR/ER AROM as they are most limited and reaching behind her back is most functionally limiting.    Examination-Activity Limitations Carry;Lift;Dressing    Examination-Participation Restrictions Cleaning;Other    Stability/Clinical Decision Making Stable/Uncomplicated    Rehab Potential Good    PT Frequency 2x / week    PT Duration 6 weeks    PT Treatment/Interventions ADLs/Self Care Home Management;Cryotherapy;Electrical Stimulation;Iontophoresis 52m/ml Dexamethasone;Ultrasound;Gait training;Stair training;Functional mobility training;Therapeutic activities;Therapeutic exercise;Balance training;Neuromuscular re-education;Patient/family education;Manual techniques;Passive range of motion;Taping    PT Next Visit Plan AROM, appropriate strength progressions    PT Home Exercise Plan Access Code: [email or web address]  Added thumb up the back to HEP.    Consulted and Agree with Plan of Care Patient           Patient will benefit from skilled therapeutic intervention in order to improve the following deficits and impairments:  Pain, Postural dysfunction, Decreased activity tolerance, Decreased strength, Increased edema, Decreased range of motion  Visit Diagnosis: Stiffness of left shoulder, not elsewhere classified  Muscle weakness (generalized)  Localized edema     Problem List Patient Active Problem List   Diagnosis Date Noted  . Superior labrum anterior-to-posterior (SLAP) tear of left shoulder  06/04/2019  . Subclinical hypothyroidism 11/21/2016  . Vitamin D deficiency 11/21/2016  . Common migraine with intractable migraine 06/20/2016    RFarley LyPT, MPT 09/21/2019, 1:20 PM  [facility] [address] [address]Phone: [phone]  Fax:  [phone] Name: Elizabeth DANNENBERGMRN: [id number] of Birth: 2October 22, 1982

## 2019-09-23 ENCOUNTER — Ambulatory Visit: Payer: BC Managed Care – PPO | Admitting: Physical Therapy

## 2019-09-23 ENCOUNTER — Other Ambulatory Visit: Payer: Self-pay

## 2019-09-23 ENCOUNTER — Encounter: Payer: Self-pay | Admitting: Physical Therapy

## 2019-09-23 DIAGNOSIS — M25512 Pain in left shoulder: Secondary | ICD-10-CM | POA: Diagnosis not present

## 2019-09-23 DIAGNOSIS — R600 Localized edema: Secondary | ICD-10-CM

## 2019-09-23 DIAGNOSIS — M25612 Stiffness of left shoulder, not elsewhere classified: Secondary | ICD-10-CM

## 2019-09-23 DIAGNOSIS — M6281 Muscle weakness (generalized): Secondary | ICD-10-CM | POA: Diagnosis not present

## 2019-09-23 NOTE — Therapy (Signed)
[place], [place], [address] Phone: [phone]   Fax:  [phone]  Physical Therapy Treatment Progress note: Re-certification   Patient Details  Name: Elizabeth Contreras MRN: [id number] Date of Birth: February 15, 1980 Referring Provider (PT): Frankey Shown MD   Encounter Date: 09/23/2019   PT End of Session - 09/23/19 0948    Visit Number 10    Number of Visits 12    Date for PT Re-Evaluation 11/18/19    Authorization Type recert sent on 05/28/9530    Progress Note Due on Visit 20    PT Start Time 0932    PT Stop Time 1013    PT Time Calculation (min) 41 min    Activity Tolerance Patient tolerated treatment well;No increased pain    Behavior During Therapy WFL for tasks assessed/performed           Past Medical History:  Diagnosis Date  . Asthma   . Common migraine with intractable migraine 06/20/2016  . Herpes   . Migraine without aura   . PCOS (polycystic ovarian syndrome)   . PONV (postoperative nausea and vomiting)   . PTSD (post-traumatic stress disorder) 2015  . Sinusitis   . 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 Thyroid disease     Past Surgical History:  Procedure Laterality Date  . COLPOSCOPY    . NASAL SINUS SURGERY  07/2015  . SHOULDER ARTHROSCOPY WITH BICEPS TENDON REPAIR Left 07/21/2019   Procedure: LEFT SHOULDER ARTHROSCOPY WITH BICEPS TENODESIS WITH DEBRIDEMENT OF LABRUM;  Surgeon: Leandrew Koyanagi, MD;  Location: [facility];  Service: Orthopedics;  Laterality: Left;    There were no vitals filed for this visit.   Subjective Assessment - 09/23/19 0946    Subjective Pt arriving reporting no pain at rest, pt did however report mild pain when doing her exercises at home which can increase to 3/10 at times.    Pertinent History SLAP repair, biceps tenodesis L shoulder on 07/21/2019, allergies    Limitations Lifting;House hold activities    Patient Stated Goals Use  shoulder without pain and no limitations    Currently in Pain? No/denies              [facility] PT Assessment - 09/23/19 0001      AROM   Left Shoulder Flexion 150 Degrees    Left Shoulder External Rotation 60 Degrees                         [facility] Adult PT Treatment/Exercise - 09/23/19 0001      Exercises   Exercises Shoulder      Shoulder Exercises: Supine   External Rotation AROM;Left;10 reps   10 seconds   Internal Rotation AROM;Left;10 reps   10 seconds   Flexion AROM;Left;10 reps   10 seconds   Other Supine Exercises chest press with 5# on bar      Shoulder Exercises: Standing   External Rotation Strengthening;Left;10 reps   Red slow eccentrics   Theraband Level (Shoulder External Rotation) Level 2 (Red)    Internal Rotation Strengthening;Left;10 reps   Red slow eccentrics   Theraband Level (Shoulder Internal Rotation) Level 2 (Red)    Row Strengthening;Both;20 reps;Theraband    Theraband Level (Shoulder Row) Level 3 (Green)    Other Standing Exercises Thumb up the back stretch 10X 10 seconds    Other Standing Exercises ER "hitch hiker" with bilateral  UE's x 20 reps using red theraband      Shoulder Exercises: Pulleys   Flexion 2 minutes;Other (comment)    Scaption 5 minutes                  PT Education - 09/23/19 1000    Education Details Reviwed HEP and issued red and green theraband for home use.    Person(s) Educated Patient    Methods Explanation;Demonstration    Comprehension Returned demonstration;Verbalized understanding               PT Long Term Goals - 09/23/19 0951      PT LONG TERM GOAL #1   Title Pt will be independent in her HEP and progression.    Baseline issued initial program on 08/11/2019    Time 6    Status On-going    Target Date 11/18/19      PT LONG TERM GOAL #2   Title Pt will be able to improve her L shoulder flexion to >/= 160 degrees to improve ADL's.    Baseline 150 degrees    Time 8    Period  Weeks    Status On-going    Target Date 11/18/19      PT LONG TERM GOAL #3   Title Pt will be able to perform basic ALD's with </=2.10  pain reported in L shoulder.    Baseline pain varies and increases with movements    Time 8    Period Weeks    Status Partially Met    Target Date 11/18/19      PT LONG TERM GOAL #4   Title Pt will improve her active L shoulder ER to >/=70 degrees to improve functional mobility.    Baseline PROM: 60 degrees    Time 8    Period Weeks    Status On-going    Target Date 11/18/19                 Plan - 09/23/19 0956    Clinical Impression Statement Amyrah continues to make progress toward her LTG"s set at initial evaluaiton. Pt has partially met one goal and is still progressing toward other goals set. Pt is progressing with ROM and overall strength. R shoulder flexion: 150 degrees, R shoulder ER to 60 degrees passively. Continue skilled PT one time a week for 8 more weeks with focus on rotational components and strengthening.    Examination-Activity Limitations Carry;Lift;Dressing    Examination-Participation Restrictions Cleaning;Other    Stability/Clinical Decision Making Stable/Uncomplicated    Rehab Potential Good    PT Frequency 2x / week    PT Duration 6 weeks    PT Treatment/Interventions ADLs/Self Care Home Management;Cryotherapy;Electrical Stimulation;Iontophoresis 2m/ml Dexamethasone;Ultrasound;Gait training;Stair training;Functional mobility training;Therapeutic activities;Therapeutic exercise;Balance training;Neuromuscular re-education;Patient/family education;Manual techniques;Passive range of motion;Taping    PT Next Visit Plan AROM, appropriate strength progressions    PT Home Exercise Plan Access Code: [email or web address]  Added thumb up the back to HEP.    Consulted and Agree with Plan of Care Patient           Patient will benefit from skilled therapeutic intervention in order to improve the following deficits and impairments:  Pain,  Postural dysfunction, Decreased activity tolerance, Decreased strength, Increased edema, Decreased range of motion  Visit Diagnosis: Stiffness of left shoulder, not elsewhere classified - Plan: PT plan of care cert/re-cert  Muscle weakness (generalized) - Plan: PT plan of care cert/re-cert  Localized edema - Plan: PT plan of care  cert/re-cert  Acute pain of left shoulder - Plan: PT plan of care cert/re-cert     Problem List Patient Active Problem List   Diagnosis Date Noted  . Superior labrum anterior-to-posterior (SLAP) tear of left shoulder 06/04/2019  . Subclinical hypothyroidism 11/21/2016  . Vitamin D deficiency 11/21/2016  . Common migraine with intractable migraine 06/20/2016    Oretha Caprice, PT, MPT 09/23/2019, 10:13 AM  [facility] [address] [place], [place], [address] Phone: [phone]   Fax:  [phone]  Name: AALANI AIKENS MRN: [id number] Date of Birth: 09/01/1980

## 2019-09-27 ENCOUNTER — Encounter: Payer: BC Managed Care – PPO | Admitting: Rehabilitative and Restorative Service Providers"

## 2019-10-01 ENCOUNTER — Encounter: Payer: Self-pay | Admitting: Rehabilitative and Restorative Service Providers"

## 2019-10-01 ENCOUNTER — Ambulatory Visit: Payer: BC Managed Care – PPO | Admitting: Rehabilitative and Restorative Service Providers"

## 2019-10-01 ENCOUNTER — Other Ambulatory Visit: Payer: Self-pay

## 2019-10-01 DIAGNOSIS — R600 Localized edema: Secondary | ICD-10-CM | POA: Diagnosis not present

## 2019-10-01 DIAGNOSIS — G8929 Other chronic pain: Secondary | ICD-10-CM

## 2019-10-01 DIAGNOSIS — M25512 Pain in left shoulder: Secondary | ICD-10-CM

## 2019-10-01 DIAGNOSIS — M6281 Muscle weakness (generalized): Secondary | ICD-10-CM | POA: Diagnosis not present

## 2019-10-01 DIAGNOSIS — M25612 Stiffness of left shoulder, not elsewhere classified: Secondary | ICD-10-CM | POA: Diagnosis not present

## 2019-10-01 NOTE — Therapy (Signed)
[place], [place], [address] Phone: [phone]   Fax:  [phone]  Physical Therapy Treatment  Patient Details  Name: Elizabeth Contreras MRN: [id number] Date of Birth: 15-Aug-1980 Referring Provider (PT): Frankey Shown MD   Encounter Date: 10/01/2019   PT End of Session - 10/01/19 1435    Visit Number 11    Number of Visits 12    Date for PT Re-Evaluation 11/18/19    Authorization Type recert sent on 7/94/8016    Progress Note Due on Visit 20    PT Start Time 0930    PT Stop Time 1015    PT Time Calculation (min) 45 min    Activity Tolerance Patient tolerated treatment well;No increased pain    Behavior During Therapy WFL for tasks assessed/performed           Past Medical History:  Diagnosis Date  . Asthma   . Common migraine with intractable migraine 06/20/2016  . Herpes   . Migraine without aura   . PCOS (polycystic ovarian syndrome)   . PONV (postoperative nausea and vomiting)   . PTSD (post-traumatic stress disorder) 2015  . Sinusitis   . 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 Thyroid disease     Past Surgical History:  Procedure Laterality Date  . COLPOSCOPY    . NASAL SINUS SURGERY  07/2015  . SHOULDER ARTHROSCOPY WITH BICEPS TENDON REPAIR Left 07/21/2019   Procedure: LEFT SHOULDER ARTHROSCOPY WITH BICEPS TENODESIS WITH DEBRIDEMENT OF LABRUM;  Surgeon: Leandrew Koyanagi, MD;  Location: [facility];  Service: Orthopedics;  Laterality: Left;    There were no vitals filed for this visit.   Subjective Assessment - 10/01/19 1433    Subjective Elizabeth Contreras notes better AROM with her L shoulder.    Pertinent History SLAP repair, biceps tenodesis L shoulder on 07/21/2019, allergies    Limitations Lifting;House hold activities    Patient Stated Goals Use shoulder without pain and no limitations    Currently in Pain? Yes    Pain Score 3     Pain Location Shoulder    Pain Orientation  Left    Pain Descriptors / Indicators Sore    Pain Type Chronic pain    Pain Radiating Towards NA    Pain Onset More than a month ago    Pain Frequency Intermittent    Aggravating Factors  Exercises    Pain Relieving Factors Ice and rest    Effect of Pain on Daily Activities Although improving, reaching and AROM are limited.              [facility] PT Assessment - 10/01/19 0001      AROM   Left Shoulder Flexion 160 Degrees    Left Shoulder Internal Rotation 35 Degrees    Left Shoulder External Rotation 75 Degrees    Left Shoulder Horizontal ADduction 55 Degrees                         [facility] Adult PT Treatment/Exercise - 10/01/19 0001      Exercises   Exercises Shoulder      Shoulder Exercises: Supine   External Rotation AROM;Left;10 reps   10 seconds   Internal Rotation AROM;Left;10 reps   10 seconds   Flexion AROM;Left;10 reps   10 seconds     Shoulder Exercises: Standing   External Rotation Strengthening;Left;10 reps   Red slow eccentrics  Theraband Level (Shoulder External Rotation) Level 2 (Red)    Internal Rotation Strengthening;Left;10 reps   Red slow eccentrics   Theraband Level (Shoulder Internal Rotation) Level 2 (Red)    Row Strengthening;Both;20 reps;Theraband    Theraband Level (Shoulder Row) Level 3 (Green)    Other Standing Exercises Thumb up the back stretch 10X 10 seconds      Shoulder Exercises: Pulleys   Flexion 2 minutes    Scaption 2 minutes                  PT Education - 10/01/19 1434    Education Details Reviewed HEP with PT overpressure.    Person(s) Educated Patient    Methods Explanation;Demonstration;Tactile cues;Verbal cues    Comprehension Verbalized understanding;Tactile cues required;Need further instruction;Returned demonstration;Verbal cues required               PT Long Term Goals - 10/01/19 1435      PT LONG TERM GOAL #1   Title Pt will be independent in her HEP and progression.    Baseline issued  initial program on 08/11/2019    Time 6    Status On-going      PT LONG TERM GOAL #2   Title Pt will be able to improve her L shoulder flexion to >/= 160 degrees to improve ADL's.    Baseline 150 degrees    Time 8    Period Weeks    Status Achieved      PT LONG TERM GOAL #3   Title Pt will be able to perform basic ALD's with </=2.10  pain reported in L shoulder.    Baseline pain varies and increases with movements    Time 8    Period Weeks    Status Partially Met      PT LONG TERM GOAL #4   Title Pt will improve her active L shoulder ER to >/=70 degrees to improve functional mobility.    Baseline PROM: 60 degrees    Time 8    Period Weeks    Status Achieved                 Plan - 10/01/19 1436    Clinical Impression Statement Elizabeth Contreras continues to make gains with her objective AROM and subjective strength (not assessed by PT).  Continued gains are expected and she will benefit from the recommended course of PT to restore 90%+ AROM and 60%+ strength in preparation for more independent rehabilitation.    Examination-Activity Limitations Carry;Lift;Dressing    Examination-Participation Restrictions Cleaning;Other    Stability/Clinical Decision Making Stable/Uncomplicated    Rehab Potential Good    PT Frequency 2x / week    PT Duration 6 weeks    PT Treatment/Interventions ADLs/Self Care Home Management;Cryotherapy;Electrical Stimulation;Iontophoresis 36m/ml Dexamethasone;Ultrasound;Gait training;Stair training;Functional mobility training;Therapeutic activities;Therapeutic exercise;Balance training;Neuromuscular re-education;Patient/family education;Manual techniques;Passive range of motion;Taping    PT Next Visit Plan AROM, appropriate strength progressions    PT Home Exercise Plan Access Code: [email or web address]  Added thumb up the back to HEP.    Consulted and Agree with Plan of Care Patient           Patient will benefit from skilled therapeutic intervention in order to improve  the following deficits and impairments:  Pain, Postural dysfunction, Decreased activity tolerance, Decreased strength, Increased edema, Decreased range of motion  Visit Diagnosis: Stiffness of left shoulder, not elsewhere classified  Muscle weakness (generalized)  Localized edema  Chronic left shoulder pain     Problem  List Patient Active Problem List   Diagnosis Date Noted  . Superior labrum anterior-to-posterior (SLAP) tear of left shoulder 06/04/2019  . Subclinical hypothyroidism 11/21/2016  . Vitamin D deficiency 11/21/2016  . Common migraine with intractable migraine 06/20/2016    Farley Ly PT, MPT 10/01/2019, 2:38 PM  [facility] [address] [place], [place], [address] Phone: [phone]   Fax:  [phone]  Name: Elizabeth Contreras MRN: [id number] Date of Birth: 05-08-1980

## 2019-10-05 ENCOUNTER — Other Ambulatory Visit: Payer: Self-pay | Admitting: Family Medicine

## 2019-10-05 DIAGNOSIS — B009 Herpesviral infection, unspecified: Secondary | ICD-10-CM

## 2019-10-08 ENCOUNTER — Encounter: Payer: Self-pay | Admitting: Rehabilitative and Restorative Service Providers"

## 2019-10-08 ENCOUNTER — Ambulatory Visit: Payer: BC Managed Care – PPO | Admitting: Rehabilitative and Restorative Service Providers"

## 2019-10-08 ENCOUNTER — Other Ambulatory Visit: Payer: Self-pay

## 2019-10-08 DIAGNOSIS — M6281 Muscle weakness (generalized): Secondary | ICD-10-CM

## 2019-10-08 DIAGNOSIS — M25512 Pain in left shoulder: Secondary | ICD-10-CM | POA: Diagnosis not present

## 2019-10-08 DIAGNOSIS — M25612 Stiffness of left shoulder, not elsewhere classified: Secondary | ICD-10-CM

## 2019-10-08 DIAGNOSIS — R600 Localized edema: Secondary | ICD-10-CM

## 2019-10-08 DIAGNOSIS — G8929 Other chronic pain: Secondary | ICD-10-CM

## 2019-10-08 NOTE — Therapy (Signed)
[place], [place], [address] Phone: [phone]   Fax:  [phone]  Physical Therapy Treatment  Patient Details  Name: Elizabeth Contreras MRN: [id number] Date of Birth: 1980-09-11 Referring Provider (PT): Frankey Shown MD   Encounter Date: 10/08/2019   PT End of Session - 10/08/19 0806    Visit Number 12    Number of Visits 20    Date for PT Re-Evaluation 11/18/19    Authorization Type recert sent on 7/68/1157    Progress Note Due on Visit 20    PT Start Time 0801    PT Stop Time 0840    PT Time Calculation (min) 39 min    Activity Tolerance Patient tolerated treatment well    Behavior During Therapy [facility] for tasks assessed/performed           Past Medical History:  Diagnosis Date  . Asthma   . Common migraine with intractable migraine 06/20/2016  . Herpes   . Migraine without aura   . PCOS (polycystic ovarian syndrome)   . PONV (postoperative nausea and vomiting)   . PTSD (post-traumatic stress disorder) 2015  . Sinusitis   . 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 Thyroid disease     Past Surgical History:  Procedure Laterality Date  . COLPOSCOPY    . NASAL SINUS SURGERY  07/2015  . SHOULDER ARTHROSCOPY WITH BICEPS TENDON REPAIR Left 07/21/2019   Procedure: LEFT SHOULDER ARTHROSCOPY WITH BICEPS TENODESIS WITH DEBRIDEMENT OF LABRUM;  Surgeon: Leandrew Koyanagi, MD;  Location: [place];  Service: Orthopedics;  Laterality: Left;    There were no vitals filed for this visit.   Subjective Assessment - 10/08/19 0804    Subjective Pt. indicated feeling some catching c some movements that can be painful/achy.    Pertinent History SLAP repair, biceps tenodesis L shoulder on 07/21/2019, allergies    Limitations Lifting;House hold activities    Patient Stated Goals Use shoulder without pain and no limitations    Currently in Pain? Yes    Pain Score 2     Pain Location Shoulder    Pain  Orientation Left    Pain Descriptors / Indicators Aching    Pain Type Chronic pain;Surgical pain    Pain Onset More than a month ago    Pain Frequency Occasional    Aggravating Factors  Some various movement in exercise    Pain Relieving Factors Resting from exercise                             [facility] Adult PT Treatment/Exercise - 10/08/19 0001      Shoulder Exercises: Supine   Horizontal ABduction Strengthening;Both   3 x 10   Theraband Level (Shoulder Horizontal ABduction) Level 3 (Green)      Shoulder Exercises: Sidelying   External Rotation AROM;Left   3 x 10 c compression   Flexion Left;Strengthening   3 x 10   Flexion Weight (lbs) 1    ABduction Left;Strengthening   3 x 10   ABduction Weight (lbs) 1      Shoulder Exercises: Standing   External Rotation Strengthening;Left   3 x 10   Theraband Level (Shoulder External Rotation) Level 3 (Green)    Internal Rotation Strengthening;Left   3 x 10   Theraband Level (Shoulder Internal Rotation) Level 3 (Green)    Corporate treasurer;Both   3  x 15 scap retraction focus   Theraband Level (Shoulder Row) Level 3 (Green)      Shoulder Exercises: ROM/Strengthening   UBE (Upper Arm Bike) Lvl 2.5 2 mins fwd/back each way      Manual Therapy   Manual therapy comments compression c movement to Lt infraspinatus, lat pin and stretch, g3 inferior jt mobs                       PT Long Term Goals - 10/01/19 1435      PT LONG TERM GOAL #1   Title Pt will be independent in her HEP and progression.    Baseline issued initial program on 08/11/2019    Time 6    Status On-going      PT LONG TERM GOAL #2   Title Pt will be able to improve her L shoulder flexion to >/= 160 degrees to improve ADL's.    Baseline 150 degrees    Time 8    Period Weeks    Status Achieved      PT LONG TERM GOAL #3   Title Pt will be able to perform basic ALD's with </=2.10  pain reported in L shoulder.    Baseline pain varies and  increases with movements    Time 8    Period Weeks    Status Partially Met      PT LONG TERM GOAL #4   Title Pt will improve her active L shoulder ER to >/=70 degrees to improve functional mobility.    Baseline PROM: 60 degrees    Time 8    Period Weeks    Status Achieved                 Plan - 10/08/19 0825    Clinical Impression Statement Increased scapulothoracic mobility secondary to Lt [place] jt and myofascial limitations.  No observed complaints of catching during intervention in clinic.  Shrug noted in elevation attempts so gravity reduced strengthening (sidelying) indicated at this time.    Examination-Activity Limitations Carry;Lift;Dressing    Examination-Participation Restrictions Cleaning;Other    Stability/Clinical Decision Making Stable/Uncomplicated    Rehab Potential Good    PT Frequency 2x / week    PT Duration 6 weeks    PT Treatment/Interventions ADLs/Self Care Home Management;Cryotherapy;Electrical Stimulation;Iontophoresis 27m/ml Dexamethasone;Ultrasound;Gait training;Stair training;Functional mobility training;Therapeutic activities;Therapeutic exercise;Balance training;Neuromuscular re-education;Patient/family education;Manual techniques;Passive range of motion;Taping    PT Next Visit Plan End range GH mobility gains, lat, infraspinatus, subscap mobility gains, progressive strengthening.    PT Home Exercise Plan Access Code: [email or web address]  Added thumb up the back to HEP.    Consulted and Agree with Plan of Care Patient           Patient will benefit from skilled therapeutic intervention in order to improve the following deficits and impairments:  Pain, Postural dysfunction, Decreased activity tolerance, Decreased strength, Increased edema, Decreased range of motion  Visit Diagnosis: Stiffness of left shoulder, not elsewhere classified  Muscle weakness (generalized)  Localized edema  Chronic left shoulder pain     Problem List Patient Active Problem  List   Diagnosis Date Noted  . Superior labrum anterior-to-posterior (SLAP) tear of left shoulder 06/04/2019  . Subclinical hypothyroidism 11/21/2016  . Vitamin D deficiency 11/21/2016  . Common migraine with intractable migraine 06/20/2016    MScot Jun PT, DPT, OCS, ATC 10/08/19  8:36 AM    [facility] [address] [address]Phone:  [phone]   Fax:  [phone]  Name: Elizabeth Contreras MRN: [id number] Date of Birth: 03-09-1980

## 2019-10-14 ENCOUNTER — Other Ambulatory Visit: Payer: Self-pay

## 2019-10-14 ENCOUNTER — Ambulatory Visit: Payer: BC Managed Care – PPO | Admitting: Rehabilitative and Restorative Service Providers"

## 2019-10-14 ENCOUNTER — Encounter: Payer: Self-pay | Admitting: Rehabilitative and Restorative Service Providers"

## 2019-10-14 DIAGNOSIS — M6281 Muscle weakness (generalized): Secondary | ICD-10-CM

## 2019-10-14 DIAGNOSIS — M25512 Pain in left shoulder: Secondary | ICD-10-CM | POA: Diagnosis not present

## 2019-10-14 DIAGNOSIS — G8929 Other chronic pain: Secondary | ICD-10-CM

## 2019-10-14 DIAGNOSIS — R600 Localized edema: Secondary | ICD-10-CM | POA: Diagnosis not present

## 2019-10-14 DIAGNOSIS — M25612 Stiffness of left shoulder, not elsewhere classified: Secondary | ICD-10-CM

## 2019-10-14 NOTE — Therapy (Signed)
[place], [place], [address] Phone: [phone]   Fax:  [phone]  Physical Therapy Treatment  Patient Details  Name: Elizabeth Contreras MRN: [id number] Date of Birth: 1980/04/10 Referring Provider (PT): Frankey Shown MD   Encounter Date: 10/14/2019   PT End of Session - 10/14/19 0933    Visit Number 13    Number of Visits 20    Date for PT Re-Evaluation 11/18/19    Authorization Type recert sent on 0/78/6754    Progress Note Due on Visit 20    PT Start Time 0930    PT Stop Time 1010    PT Time Calculation (min) 40 min    Activity Tolerance Patient tolerated treatment well    Behavior During Therapy [facility] for tasks assessed/performed           Past Medical History:  Diagnosis Date  . Asthma   . Common migraine with intractable migraine 06/20/2016  . Herpes   . Migraine without aura   . PCOS (polycystic ovarian syndrome)   . PONV (postoperative nausea and vomiting)   . PTSD (post-traumatic stress disorder) 2015  . Sinusitis   . 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 Thyroid disease     Past Surgical History:  Procedure Laterality Date  . COLPOSCOPY    . NASAL SINUS SURGERY  07/2015  . SHOULDER ARTHROSCOPY WITH BICEPS TENDON REPAIR Left 07/21/2019   Procedure: LEFT SHOULDER ARTHROSCOPY WITH BICEPS TENODESIS WITH DEBRIDEMENT OF LABRUM;  Surgeon: Leandrew Koyanagi, MD;  Location: [place];  Service: Orthopedics;  Laterality: Left;    There were no vitals filed for this visit.   Subjective Assessment - 10/14/19 0932    Subjective Pt. stated feeling looser some and sometimes still tight.   Pt. stated a little bit of catching noted but not as bad.    Pertinent History SLAP repair, biceps tenodesis L shoulder on 07/21/2019, allergies    Limitations Lifting;House hold activities    Patient Stated Goals Use shoulder without pain and no limitations    Currently in Pain? No/denies    Pain  Score 0-No pain    Pain Onset More than a month ago                             [facility] Adult PT Treatment/Exercise - 10/14/19 0001      Shoulder Exercises: Prone   Other Prone Exercises SA press on elbows 5 sec hold x 10      Shoulder Exercises: Sidelying   Flexion Left;Strengthening    Flexion Weight (lbs) 2    ABduction Left;Strengthening   3 x 15   ABduction Weight (lbs) 1      Shoulder Exercises: Standing   External Rotation Strengthening;Left   3 x 15   Theraband Level (Shoulder External Rotation) Level 3 (Green)    Internal Rotation Strengthening;Left   3 x 15   Theraband Level (Shoulder Internal Rotation) Level 3 (Green)      Shoulder Exercises: ROM/Strengthening   UBE (Upper Arm Bike) Lvl 3 3 mins fwd/back each way      Shoulder Exercises: Stretch   Other Shoulder Stretches ir rope stretch 30 sec x 3 c front of shoulder on wall      Manual Therapy   Manual therapy comments Compression c movement to Lt lat, infraspinatus, subscap, prom  PT Long Term Goals - 10/01/19 1435      PT LONG TERM GOAL #1   Title Pt will be independent in her HEP and progression.    Baseline issued initial program on 08/11/2019    Time 6    Status On-going      PT LONG TERM GOAL #2   Title Pt will be able to improve her L shoulder flexion to >/= 160 degrees to improve ADL's.    Baseline 150 degrees    Time 8    Period Weeks    Status Achieved      PT LONG TERM GOAL #3   Title Pt will be able to perform basic ALD's with </=2.10  pain reported in L shoulder.    Baseline pain varies and increases with movements    Time 8    Period Weeks    Status Partially Met      PT LONG TERM GOAL #4   Title Pt will improve her active L shoulder ER to >/=70 degrees to improve functional mobility.    Baseline PROM: 60 degrees    Time 8    Period Weeks    Status Achieved                 Plan - 10/14/19 0947    Clinical Impression  Statement Improved lat mobility and general Lt GH mobility noted compared to last visit.  ER/IR mobility still showing limitations in 90 deg abd but progressing.  Tolerance to strengthening program improved as well c improved quality of movement.    Examination-Activity Limitations Carry;Lift;Dressing    Examination-Participation Restrictions Cleaning;Other    Stability/Clinical Decision Making Stable/Uncomplicated    Rehab Potential Good    PT Frequency 2x / week    PT Duration 6 weeks    PT Treatment/Interventions ADLs/Self Care Home Management;Cryotherapy;Electrical Stimulation;Iontophoresis 73m/ml Dexamethasone;Ultrasound;Gait training;Stair training;Functional mobility training;Therapeutic activities;Therapeutic exercise;Balance training;Neuromuscular re-education;Patient/family education;Manual techniques;Passive range of motion;Taping    PT Next Visit Plan End range GH mobility gains, lat, infraspinatus, subscap mobility gains, progressive strengthening.    PT Home Exercise Plan Access Code: [email or web address]  Added thumb up the back to HEP.    Consulted and Agree with Plan of Care Patient           Patient will benefit from skilled therapeutic intervention in order to improve the following deficits and impairments:  Pain, Postural dysfunction, Decreased activity tolerance, Decreased strength, Increased edema, Decreased range of motion  Visit Diagnosis: Chronic left shoulder pain  Stiffness of left shoulder, not elsewhere classified  Muscle weakness (generalized)  Localized edema     Problem List Patient Active Problem List   Diagnosis Date Noted  . Superior labrum anterior-to-posterior (SLAP) tear of left shoulder 06/04/2019  . Subclinical hypothyroidism 11/21/2016  . Vitamin D deficiency 11/21/2016  . Common migraine with intractable migraine 06/20/2016    MScot Jun PT, DPT, OCS, ATC 10/14/19  10:03 AM    [facility] [address] [address]Phone: [phone]  Fax:  [phone] Name: Elizabeth ZUIDEMAMRN: [id number] of Birth: [birth year]

## 2019-10-19 ENCOUNTER — Other Ambulatory Visit: Payer: Self-pay

## 2019-10-19 ENCOUNTER — Ambulatory Visit (INDEPENDENT_AMBULATORY_CARE_PROVIDER_SITE_OTHER): Payer: BC Managed Care – PPO | Admitting: Rehabilitative and Restorative Service Providers"

## 2019-10-19 ENCOUNTER — Encounter: Payer: Self-pay | Admitting: Rehabilitative and Restorative Service Providers"

## 2019-10-19 DIAGNOSIS — G8929 Other chronic pain: Secondary | ICD-10-CM

## 2019-10-19 DIAGNOSIS — M25612 Stiffness of left shoulder, not elsewhere classified: Secondary | ICD-10-CM

## 2019-10-19 DIAGNOSIS — R600 Localized edema: Secondary | ICD-10-CM | POA: Diagnosis not present

## 2019-10-19 DIAGNOSIS — M6281 Muscle weakness (generalized): Secondary | ICD-10-CM | POA: Diagnosis not present

## 2019-10-19 DIAGNOSIS — M25512 Pain in left shoulder: Secondary | ICD-10-CM | POA: Diagnosis not present

## 2019-10-19 NOTE — Therapy (Signed)
[place], [place], [address] Phone: [phone]   Fax:  [phone]  Physical Therapy Treatment  Patient Details  Name: Elizabeth Contreras MRN: [id number] Date of Birth: Apr 30, 1980 Referring Provider (PT): Frankey Shown MD   Encounter Date: 10/19/2019   PT End of Session - 10/19/19 1022    Visit Number 14    Number of Visits 20    Date for PT Re-Evaluation 11/18/19    Authorization Type recert sent on 09/16/7515    Progress Note Due on Visit 20    PT Start Time 1016    PT Stop Time 1030    PT Time Calculation (min) 14 min    Activity Tolerance Other (comment)   Limited due to migraine complaints   Behavior During Therapy [facility] for tasks assessed/performed           Past Medical History:  Diagnosis Date  . Asthma   . Common migraine with intractable migraine 06/20/2016  . Herpes   . Migraine without aura   . PCOS (polycystic ovarian syndrome)   . PONV (postoperative nausea and vomiting)   . PTSD (post-traumatic stress disorder) 2015  . Sinusitis   . 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 Thyroid disease     Past Surgical History:  Procedure Laterality Date  . COLPOSCOPY    . NASAL SINUS SURGERY  07/2015  . SHOULDER ARTHROSCOPY WITH BICEPS TENDON REPAIR Left 07/21/2019   Procedure: LEFT SHOULDER ARTHROSCOPY WITH BICEPS TENODESIS WITH DEBRIDEMENT OF LABRUM;  Surgeon: Leandrew Koyanagi, MD;  Location: [place];  Service: Orthopedics;  Laterality: Left;    There were no vitals filed for this visit.   Subjective Assessment - 10/19/19 1021    Subjective Pt. indicated some catching occasional over the weekend one day but mostly no.  Pt. stated still avoiding some yoga activity due to shoulder at this point.    Pertinent History SLAP repair, biceps tenodesis L shoulder on 07/21/2019, allergies    Limitations Lifting;House hold activities    Patient Stated Goals Use shoulder without pain  and no limitations    Currently in Pain? No/denies    Pain Score 0-No pain    Pain Onset More than a month ago                             [facility] Adult PT Treatment/Exercise - 10/19/19 0001      Shoulder Exercises: ROM/Strengthening   UBE (Upper Arm Bike) Lvl 3 2 mins alt fwd/back 8 mins total                       PT Long Term Goals - 10/01/19 1435      PT LONG TERM GOAL #1   Title Pt will be independent in her HEP and progression.    Baseline issued initial program on 08/11/2019    Time 6    Status On-going      PT LONG TERM GOAL #2   Title Pt will be able to improve her L shoulder flexion to >/= 160 degrees to improve ADL's.    Baseline 150 degrees    Time 8    Period Weeks    Status Achieved      PT LONG TERM GOAL #3   Title Pt will be able to perform basic ALD's with </=2.10  pain reported in  L shoulder.    Baseline pain varies and increases with movements    Time 8    Period Weeks    Status Partially Met      PT LONG TERM GOAL #4   Title Pt will improve her active L shoulder ER to >/=70 degrees to improve functional mobility.    Baseline PROM: 60 degrees    Time 8    Period Weeks    Status Achieved                 Plan - 10/19/19 1031    Clinical Impression Statement Pt. requested visit to be stopped today due to onset of migraine complaints that built c initial start of treatment session today.  Session stopped c review of some adjustments for yoga participation.    Examination-Activity Limitations Carry;Lift;Dressing    Examination-Participation Restrictions Cleaning;Other    Stability/Clinical Decision Making Stable/Uncomplicated    Rehab Potential Good    PT Frequency 2x / week    PT Duration 6 weeks    PT Treatment/Interventions ADLs/Self Care Home Management;Cryotherapy;Electrical Stimulation;Iontophoresis 46m/ml Dexamethasone;Ultrasound;Gait training;Stair training;Functional mobility training;Therapeutic  activities;Therapeutic exercise;Balance training;Neuromuscular re-education;Patient/family education;Manual techniques;Passive range of motion;Taping    PT Next Visit Plan WB positioning for return to yoga movements    PT Home Exercise Plan Access Code: [email or web address]    Consulted and Agree with Plan of Care Patient           Patient will benefit from skilled therapeutic intervention in order to improve the following deficits and impairments:  Pain, Postural dysfunction, Decreased activity tolerance, Decreased strength, Increased edema, Decreased range of motion  Visit Diagnosis: Chronic left shoulder pain  Stiffness of left shoulder, not elsewhere classified  Muscle weakness (generalized)  Localized edema     Problem List Patient Active Problem List   Diagnosis Date Noted  . Superior labrum anterior-to-posterior (SLAP) tear of left shoulder 06/04/2019  . Subclinical hypothyroidism 11/21/2016  . Vitamin D deficiency 11/21/2016  . Common migraine with intractable migraine 06/20/2016    MScot Jun PT, DPT, OCS, ATC 10/19/19  10:33 AM    [facility] [address] [address]Phone: [phone]  Fax:  [phone] Name: AKARAGAN LEHRMRN: [id number] of Birth: [birth year]

## 2019-10-26 ENCOUNTER — Encounter: Payer: BC Managed Care – PPO | Admitting: Rehabilitative and Restorative Service Providers"

## 2019-11-02 ENCOUNTER — Ambulatory Visit: Payer: BC Managed Care – PPO | Admitting: Rehabilitative and Restorative Service Providers"

## 2019-11-02 ENCOUNTER — Encounter: Payer: Self-pay | Admitting: Rehabilitative and Restorative Service Providers"

## 2019-11-02 ENCOUNTER — Other Ambulatory Visit: Payer: Self-pay

## 2019-11-02 DIAGNOSIS — R600 Localized edema: Secondary | ICD-10-CM

## 2019-11-02 DIAGNOSIS — M6281 Muscle weakness (generalized): Secondary | ICD-10-CM | POA: Diagnosis not present

## 2019-11-02 DIAGNOSIS — M25612 Stiffness of left shoulder, not elsewhere classified: Secondary | ICD-10-CM | POA: Diagnosis not present

## 2019-11-02 DIAGNOSIS — M25512 Pain in left shoulder: Secondary | ICD-10-CM

## 2019-11-02 DIAGNOSIS — G8929 Other chronic pain: Secondary | ICD-10-CM

## 2019-11-02 NOTE — Therapy (Addendum)
[place], [place], [address] Phone: [phone]   Fax:  [phone]  Physical Therapy Treatment/Discharge  Patient Details  Name: Elizabeth Contreras MRN: [id number] Date of Birth: 1981-01-31 Referring Provider (PT): Frankey Shown MD   Encounter Date: 11/02/2019   PT End of Session - 11/02/19 0944    Visit Number 15    Number of Visits 20    Date for PT Re-Evaluation 11/18/19    Authorization Type recert sent on 2/75/1700    Progress Note Due on Visit 20    PT Start Time 0931    PT Stop Time 1009    PT Time Calculation (min) 38 min    Activity Tolerance Patient tolerated treatment well   Limited due to migraine complaints   Behavior During Therapy [facility] for tasks assessed/performed           Past Medical History:  Diagnosis Date  . Asthma   . Common migraine with intractable migraine 06/20/2016  . Herpes   . Migraine without aura   . PCOS (polycystic ovarian syndrome)   . PONV (postoperative nausea and vomiting)   . PTSD (post-traumatic stress disorder) 2015  . Sinusitis   . 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 Thyroid disease     Past Surgical History:  Procedure Laterality Date  . COLPOSCOPY    . NASAL SINUS SURGERY  07/2015  . SHOULDER ARTHROSCOPY WITH BICEPS TENDON REPAIR Left 07/21/2019   Procedure: LEFT SHOULDER ARTHROSCOPY WITH BICEPS TENODESIS WITH DEBRIDEMENT OF LABRUM;  Surgeon: Leandrew Koyanagi, MD;  Location: [place];  Service: Orthopedics;  Laterality: Left;    There were no vitals filed for this visit.   Subjective Assessment - 11/02/19 0936    Subjective Pt. indicated doing better overall and making some progress.  Pt. indicated able to return to some yoga since.    Pertinent History SLAP repair, biceps tenodesis L shoulder on 07/21/2019, allergies    Limitations Lifting;House hold activities    Patient Stated Goals Use shoulder without pain and no limitations      Currently in Pain? No/denies    Pain Score 0-No pain    Pain Onset More than a month ago    Aggravating Factors  lifting creates shrug at times, tightness c ER              [facility] PT Assessment - 11/02/19 0001      AROM   Left Shoulder Flexion 155 Degrees    Left Shoulder Internal Rotation 60 Degrees   measured in 45 deg abd supine   Left Shoulder External Rotation 65 Degrees   measured in 45 deg abd supine                        [facility] Adult PT Treatment/Exercise - 11/02/19 0001      Shoulder Exercises: Supine   Other Supine Exercises supine horizontal abd, d2 ext bilateral green band x 15 each      Shoulder Exercises: Prone   Other Prone Exercises qruped y, t 2 x 10 each, bilateral      Shoulder Exercises: Sidelying   ABduction Left;Strengthening   2x 15   ABduction Weight (lbs) 2      Shoulder Exercises: Standing   External Rotation Strengthening   3 x 10   Theraband Level (Shoulder External Rotation) Level 4 (Blue)    Other Standing Exercises standing  y wall slides c lift off x 12      Shoulder Exercises: Stretch   Other Shoulder Stretches sleeper stretch 30 sec x 3 Lt UE      Manual Therapy   Manual therapy comments MWM c ER, ap mobs Lt Gh jt, inferior jt mobs                       PT Long Term Goals - 10/01/19 1435      PT LONG TERM GOAL #1   Title Pt will be independent in her HEP and progression.    Baseline issued initial program on 08/11/2019    Time 6    Status On-going      PT LONG TERM GOAL #2   Title Pt will be able to improve her L shoulder flexion to >/= 160 degrees to improve ADL's.    Baseline 150 degrees    Time 8    Period Weeks    Status Achieved      PT LONG TERM GOAL #3   Title Pt will be able to perform basic ALD's with </=2.10  pain reported in L shoulder.    Baseline pain varies and increases with movements    Time 8    Period Weeks    Status Partially Met      PT LONG TERM GOAL #4   Title Pt will  improve her active L shoulder ER to >/=70 degrees to improve functional mobility.    Baseline PROM: 60 degrees    Time 8    Period Weeks    Status Achieved                 Plan - 11/02/19 0944    Clinical Impression Statement Firm capsular stretch at end range c progress noted in manual intervention post assessment compared to arrival restriction.  Improving active mobility control noted compared to previous.  Good HEP.    Examination-Activity Limitations Carry;Lift;Dressing    Examination-Participation Restrictions Cleaning;Other    Stability/Clinical Decision Making Stable/Uncomplicated    Rehab Potential Good    PT Frequency 2x / week    PT Duration 6 weeks    PT Treatment/Interventions ADLs/Self Care Home Management;Cryotherapy;Electrical Stimulation;Iontophoresis 53m/ml Dexamethasone;Ultrasound;Gait training;Stair training;Functional mobility training;Therapeutic activities;Therapeutic exercise;Balance training;Neuromuscular re-education;Patient/family education;Manual techniques;Passive range of motion;Taping    PT Next Visit Plan HEP transitioning?    PT Home Exercise Plan Access Code: [email or web address]    Consulted and Agree with Plan of Care Patient           Patient will benefit from skilled therapeutic intervention in order to improve the following deficits and impairments:  Pain, Postural dysfunction, Decreased activity tolerance, Decreased strength, Increased edema, Decreased range of motion  Visit Diagnosis: Chronic left shoulder pain  Stiffness of left shoulder, not elsewhere classified  Muscle weakness (generalized)  Localized edema     Problem List Patient Active Problem List   Diagnosis Date Noted  . Superior labrum anterior-to-posterior (SLAP) tear of left shoulder 06/04/2019  . Subclinical hypothyroidism 11/21/2016  . Vitamin D deficiency 11/21/2016  . Common migraine with intractable migraine 06/20/2016   MScot Jun PT, DPT, OCS, ATC 11/02/19   10:09 AM  PHYSICAL THERAPY DISCHARGE SUMMARY  Visits from Start of Care: 15  Current functional level related to goals / functional outcomes: See note   Remaining deficits: See note   Education / Equipment: HEP Plan: Patient agrees to discharge.  Patient goals were partially met. Patient is  being discharged due to being pleased with the current functional level.  ?????  Scot Jun, PT, DPT, OCS, ATC 11/23/19  2:31 PM        [facility] [address] [place], [place], [address] Phone: [phone]   Fax:  [phone]  Name: Elizabeth Contreras MRN: [id number] Date of Birth: Apr 10, 1980

## 2019-11-03 ENCOUNTER — Ambulatory Visit: Payer: BC Managed Care – PPO | Admitting: Physician Assistant

## 2019-11-04 NOTE — Progress Notes (Signed)
39 y.o. G0P0000 Married White or Caucasian Not Hispanic or Latino female here for annual exam.  Sexually active, no pain.  Cycles have been every 30-35 days. She c/o one day of mood changes just prior to her cycle.  Period Cycle (Days): 30 Period Duration (Days): 3-4 Period Pattern: Regular Menstrual Flow: Moderate, Light Menstrual Control: Other (Comment) (period underwear) Dysmenorrhea: (!) Moderate Dysmenorrhea Symptoms: Cramping, Headache  Patient's last menstrual period was 10/30/2019.          Sexually active: Yes.    The current method of family planning is none. H/O female factor infertility Exercising: Yes.    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 She has never used smokeless tobacco. She reports current alcohol use. She reports that she does not use drugs. Rare ETOH, working from home in [place].   Past Medical History:  Diagnosis Date  . Asthma   . Common migraine with intractable migraine 06/20/2016  . Herpes   . Migraine without aura   . PCOS (polycystic ovarian syndrome)   . PONV (postoperative nausea and vomiting)   . PTSD (post-traumatic stress disorder) 2015  . Sinusitis   . 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 Thyroid disease     Past Surgical History:  Procedure Laterality Date  . COLPOSCOPY    . NASAL SINUS SURGERY  07/2015  . SHOULDER ARTHROSCOPY WITH BICEPS TENDON REPAIR Left 07/21/2019   Procedure: LEFT SHOULDER ARTHROSCOPY WITH BICEPS TENODESIS WITH DEBRIDEMENT OF LABRUM;  Surgeon: Leandrew Koyanagi, MD;  Location: [facility];  Service: Orthopedics;  Laterality: Left;    Current Outpatient Medications  Medication Sig Dispense Refill  . acetaminophen (TYLENOL) 325 MG tablet Take 650 mg by mouth every 6 (six) hours as needed.    . ALPRAZolam (XANAX) 0.25 MG tablet Take 1 tablet (0.25 mg total) by mouth 2 (two) times daily as needed for anxiety. 20 tablet 0  . Cholecalciferol (VITAMIN D) 2000 units CAPS Take by mouth.    . cyclobenzaprine (FLEXERIL) 5 MG tablet TAKE 1 TABLET BY MOUTH THREE TIMES A DAY AS NEEDED FOR MUSCLE SPASMS 30 tablet 1  . fluticasone (FLONASE) 50 MCG/ACT nasal spray Place 1 spray into both nostrils daily. 16 g 3  . ibuprofen (ADVIL,MOTRIN) 200 MG tablet Take 200 mg by mouth every 6 (six) hours as needed.    . Loratadine (CLARITIN PO) Take by mouth daily at 12 noon.    . ondansetron (ZOFRAN) 4 MG tablet Take 1-2 tablets (4-8 mg total) by mouth every 8 (eight) hours as needed for nausea or vomiting. 20 tablet 0  . PROAIR HFA 108 (90 Base) MCG/ACT inhaler INHALE 2 PUFFS EVERY 6 HOURS AS NEEDED FOR WHEEZE OR SHORTNESS OF BREATH 8.5 Inhaler 1  . Probiotic Product (PROBIOTIC-10 PO) Take by mouth daily.    . riboflavin (VITAMIN B-2) 100 MG TABS tablet Take 100 mg by mouth daily.    . Rimegepant Sulfate (NURTEC) 75 MG TBDP Take by mouth.    . TURMERIC PO Take by  mouth.    . valACYclovir (VALTREX) 500 MG tablet TAKE 1 TABLET BY MOUTH EVERY DAY 90 tablet 0   No current facility-administered medications for this visit.    Family History  Problem Relation Age of Onset  . Migraines Mother   . Aneurysm Mother   . Cancer Maternal Aunt        ovarian  . Cancer Paternal Uncle        colon  MAunt was in her late 30's-50's when she had ovarian cancer. That Aunt had a daughter who is in her 54's. Mom was one of 4 girls. Mom is local. Discussed genetic counseling.   Review of Systems  All other systems reviewed and are negative.   Exam:   BP 106/62   Pulse 91   Ht 5\' 6"  (1.676 m)   Wt  130 lb (59 kg)   LMP 10/30/2019   SpO2 98%   BMI 20.98 kg/m   Weight change: @WEIGHTCHANGE @ Height:   Height: 5\' 6"  (167.6 cm)  Ht Readings from Last 3 Encounters:  11/08/19 5\' 6"  (1.676 m)  07/21/19 5\' 6"  (1.676 m)  06/04/19 5\' 6"  (1.676 m)    General appearance: alert, cooperative and appears stated age Head: Normocephalic, without obvious abnormality, atraumatic Neck: no adenopathy, supple, symmetrical, trachea midline and thyroid normal to inspection and palpation Lungs: clear to auscultation bilaterally Cardiovascular: regular rate and rhythm Breasts: normal appearance, no masses or tenderness Abdomen: soft, non-tender; non distended,  no masses,  no organomegaly Extremities: extremities normal, atraumatic, no cyanosis or edema Skin: Skin color, texture, turgor normal. No rashes or lesions Lymph nodes: Cervical, supraclavicular, and axillary nodes normal. No abnormal inguinal nodes palpated Neurologic: Grossly normal   Pelvic: External genitalia:  no lesions              Urethra:  normal appearing urethra with no masses, tenderness or lesions              Bartholins and Skenes: normal                 Vagina: normal appearing vagina with normal color and discharge, no lesions              Cervix: no lesions               Bimanual Exam:  Uterus:  normal size, contour, position, consistency, mobility, non-tender and retroverted              Adnexa: no mass, fullness, tenderness               Rectovaginal: Confirms               Anus:  normal sphincter tone, no lesions  Gae Dry chaperoned for the exam.  A:  Well Woman with normal exam  FH of Maternal Aunt with ovarian cancer  Female factor infertility, no contraception needed  H/O LEEP in 2019  P:   Pap with hpv  Discussed breast self exam  Discussed calcium and vit D intake  Mammogram after 40  Discussed genetic counseling for her mother and possibly her.   Labs UTD with primary

## 2019-11-08 ENCOUNTER — Other Ambulatory Visit (HOSPITAL_COMMUNITY)
Admission: RE | Admit: 2019-11-08 | Discharge: 2019-11-08 | Disposition: A | Discharge status: Home / Self Care | Payer: BC Managed Care – PPO | Source: Ambulatory Visit | Attending: Obstetrics and Gynecology | Admitting: Obstetrics and Gynecology

## 2019-11-08 ENCOUNTER — Other Ambulatory Visit: Payer: Self-pay

## 2019-11-08 ENCOUNTER — Encounter: Payer: Self-pay | Admitting: Obstetrics and Gynecology

## 2019-11-08 ENCOUNTER — Ambulatory Visit (INDEPENDENT_AMBULATORY_CARE_PROVIDER_SITE_OTHER): Payer: BC Managed Care – PPO | Admitting: Obstetrics and Gynecology

## 2019-11-08 VITALS — BP 106/62 | HR 91 | Ht 66.0 in | Wt 130.0 lb

## 2019-11-08 DIAGNOSIS — Z8041 Family history of malignant neoplasm of ovary: Secondary | ICD-10-CM | POA: Diagnosis not present

## 2019-11-08 DIAGNOSIS — Z01419 Encounter for gynecological examination (general) (routine) without abnormal findings: Secondary | ICD-10-CM

## 2019-11-08 DIAGNOSIS — Z124 Encounter for screening for malignant neoplasm of cervix: Secondary | ICD-10-CM | POA: Diagnosis not present

## 2019-11-08 DIAGNOSIS — Z9889 Other specified postprocedural states: Secondary | ICD-10-CM | POA: Insufficient documentation

## 2019-11-08 NOTE — Patient Instructions (Signed)

## 2019-11-10 ENCOUNTER — Encounter: Payer: Self-pay | Admitting: Orthopaedic Surgery

## 2019-11-10 ENCOUNTER — Ambulatory Visit: Payer: BC Managed Care – PPO | Admitting: Orthopaedic Surgery

## 2019-11-10 DIAGNOSIS — S43432A Superior glenoid labrum lesion of left shoulder, initial encounter: Secondary | ICD-10-CM

## 2019-11-10 LAB — CYTOLOGY - PAP
Comment: NEGATIVE
Diagnosis: NEGATIVE
Diagnosis: REACTIVE
High risk HPV: NEGATIVE

## 2019-11-10 NOTE — Progress Notes (Signed)
   Post-Op Visit Note   Patient: Elizabeth Contreras           Date of Birth: 02/10/1980           MRN: [id number] Visit Date: 11/10/2019 PCP: Billie Ruddy, MD   Assessment & Plan:  Chief Complaint:  Chief Complaint  Patient presents with  . Left Shoulder - Pain, Follow-up   Visit Diagnoses:  1. Superior labrum anterior-to-posterior (SLAP) tear of left shoulder     Plan: [name] is 3 months 20 days status post left shoulder scope and biceps tenodesis for SLAP tear and spinal glenoid cyst.  She is doing well her range of motion has improved significantly.  She has been discharged from outpatient PT and now doing home exercise.  Overall doing well and has only occasional mild pain.  Not taking any medications for the shoulder.  She has resumed yoga with a couple modifications.  Surgical scars are fully healed.  Range of motion is essentially nearly normal without pain.  Strength is just slightly weaker compared to the contralateral side.  From my standpoint she has advanced quite well.  She will continue with home exercises and strengthening.  Recheck in 3 months.  Anticipate releasing her at that time.  Follow-Up Instructions: Return in about 3 months (around 02/10/2020).   Orders:  No orders of the defined types were placed in this encounter.  No orders of the defined types were placed in this encounter.   Imaging: No results found.  PMFS History: Patient Active Problem List   Diagnosis Date Noted  . Superior labrum anterior-to-posterior (SLAP) tear of left shoulder 06/04/2019  . Anxiety 02/26/2019  . PTSD (post-traumatic stress disorder) 02/26/2019  . Subclinical hypothyroidism 11/21/2016  . Vitamin D deficiency 11/21/2016  . Common migraine with intractable migraine 06/20/2016   Past Medical History:  Diagnosis Date  . Asthma   . Common migraine with intractable migraine 06/20/2016  . Herpes   . Migraine without aura   . PCOS (polycystic ovarian syndrome)   . PONV  (postoperative nausea and vomiting)   . PTSD (post-traumatic stress disorder) 2015  . Sinusitis   . 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 Thyroid disease     Family History  Problem Relation Age of Onset  . Migraines Mother   . Aneurysm Mother   . Cancer Maternal Aunt        ovarian  . Cancer Paternal Uncle        colon    Past Surgical History:  Procedure Laterality Date  . COLPOSCOPY    . NASAL SINUS SURGERY  07/2015  . SHOULDER ARTHROSCOPY WITH BICEPS TENDON REPAIR Left 07/21/2019   Procedure: LEFT SHOULDER ARTHROSCOPY WITH BICEPS TENODESIS WITH DEBRIDEMENT OF LABRUM;  Surgeon: Leandrew Koyanagi, MD;  Location: [facility];  Service: Orthopedics;  Laterality: Left;   Social History   Occupational History    Comment: unemployed  Tobacco Use  . Smoking status: Never Smoker  . Smokeless tobacco: Never Used  Vaping Use  . Vaping Use: Never used  Substance and Sexual Activity  . Alcohol use: Yes    Comment: rarely  . Drug use: No  . Sexual activity: Yes    Partners: Male    Birth control/protection: None

## 2019-11-19 ENCOUNTER — Encounter: Payer: Self-pay | Admitting: Family Medicine

## 2019-11-19 ENCOUNTER — Ambulatory Visit (INDEPENDENT_AMBULATORY_CARE_PROVIDER_SITE_OTHER): Payer: BC Managed Care – PPO | Admitting: Family Medicine

## 2019-11-19 ENCOUNTER — Other Ambulatory Visit: Payer: Self-pay

## 2019-11-19 VITALS — BP 102/58 | HR 82 | Temp 97.7°F | Ht 66.0 in | Wt 133.4 lb

## 2019-11-19 DIAGNOSIS — Z23 Encounter for immunization: Secondary | ICD-10-CM | POA: Diagnosis not present

## 2019-11-19 DIAGNOSIS — E559 Vitamin D deficiency, unspecified: Secondary | ICD-10-CM | POA: Diagnosis not present

## 2019-11-19 DIAGNOSIS — Z0000 Encounter for general adult medical examination without abnormal findings: Secondary | ICD-10-CM | POA: Diagnosis not present

## 2019-11-19 DIAGNOSIS — G43809 Other migraine, not intractable, without status migrainosus: Secondary | ICD-10-CM | POA: Diagnosis not present

## 2019-11-19 DIAGNOSIS — Z13220 Encounter for screening for lipoid disorders: Secondary | ICD-10-CM | POA: Diagnosis not present

## 2019-11-19 DIAGNOSIS — L679 Hair color and hair shaft abnormality, unspecified: Secondary | ICD-10-CM | POA: Diagnosis not present

## 2019-11-19 DIAGNOSIS — F419 Anxiety disorder, unspecified: Secondary | ICD-10-CM | POA: Diagnosis not present

## 2019-11-19 DIAGNOSIS — E038 Other specified hypothyroidism: Secondary | ICD-10-CM | POA: Diagnosis not present

## 2019-11-19 NOTE — Progress Notes (Signed)
Subjective:     Elizabeth Contreras is a 39 y.o. female and is here for a comprehensive physical exam. The patient reports several concerns.  Pt interested in having labs to check thyroid and possible causes of her migraines.  Currently taking Nurtec which helps, but unable to use it multiple days in a row for continued or recurrent HAs.   Pt endorses changes in hair, more coarse and thinner.  Inquires about thyroid 2/2 history of hypothyroidism in the past.  Patient endorses mild anxiety.  LMP 10/30/2019.  Patient inquires about influenza vaccine.  Social History   Socioeconomic History  . Marital status: Married    Spouse name: Not on file  . Number of children: 0  . Years of education: 52  . Highest education level: Not on file  Occupational History    Comment: unemployed  Tobacco Use  . Smoking status: Never Smoker  . Smokeless tobacco: Never Used  Vaping Use  . Vaping Use: Never used  Substance and Sexual Activity  . Alcohol use: Yes    Comment: rarely  . Drug use: No  . Sexual activity: Yes    Partners: Male    Birth control/protection: None  Other Topics Concern  . Not on file  Social History Narrative   Lives with husband   Caffeine -tea 1-2 daily   Social Determinants of Health   Financial Resource Strain:   . Difficulty of Paying Living Expenses: Not on file  Food Insecurity:   . Worried About Charity fundraiser in the Last Year: Not on file  . Ran Out of Food in the Last Year: Not on file  Transportation Needs:   . Lack of Transportation (Medical): Not on file  . Lack of Transportation (Non-Medical): Not on file  Physical Activity:   . Days of Exercise per Week: Not on file  . Minutes of Exercise per Session: Not on file  Stress:   . Feeling of Stress : Not on file  Social Connections:   . Frequency of Communication with Friends and Family: Not on file  . Frequency of Social Gatherings with Friends and Family: Not on file  . Attends Religious Services: Not  on file  . Active Member of Clubs or Organizations: Not on file  . Attends Archivist Meetings: Not on file  . Marital Status: Not on file  Intimate Partner Violence:   . Fear of Current or Ex-Partner: Not on file  . Emotionally Abused: Not on file  . Physically Abused: Not on file  . Sexually Abused: Not on file   Health Maintenance  Topic Date Due  . Hepatitis C Screening  Never done  . COVID-19 Vaccine (1) Never done  . HIV Screening  Never done  . INFLUENZA VACCINE  09/05/2019  . PAP SMEAR-Modifier  11/08/2022  . TETANUS/TDAP  10/18/2028    The following portions of the patient's history were reviewed and updated as appropriate: allergies, current medications, past family history, past medical history, past social history, past surgical history and problem list.  Review of Systems Pertinent items noted in HPI and remainder of comprehensive ROS otherwise negative.   Objective:    BP (!) 102/58 (BP Location: Left Arm, Patient Position: Sitting, Cuff Size: Normal)   Pulse 82   Temp 97.7 F (36.5 C) (Oral)   Ht _0  (1.676 m)   Wt 133 lb 6.4 oz (60.5 kg)   LMP 10/30/2019   SpO2 99%   BMI 21.53  kg/m  General appearance: alert, cooperative and no distress Head: Normocephalic, without obvious abnormality, atraumatic Eyes: conjunctivae/corneas clear. PERRL, EOM's intact. Fundi benign. Ears: normal TM's and external ear canals both ears Nose: Nares normal. Septum midline. Mucosa normal. No drainage or sinus tenderness. Throat: lips, mucosa, and tongue normal; teeth and gums normal Neck: no adenopathy, no carotid bruit, no JVD, supple, symmetrical, trachea midline and thyroid not enlarged, symmetric, no tenderness/mass/nodules Lungs: clear to auscultation bilaterally Heart: regular rate and rhythm, S1, S2 normal, no murmur, click, rub or gallop Abdomen: soft, non-tender; bowel sounds normal; no masses,  no organomegaly Extremities: extremities normal, atraumatic,  no cyanosis or edema Pulses: 2+ and symmetric Skin: Skin color, texture, turgor normal. No rashes or lesions Lymph nodes: Cervical, supraclavicular, and axillary nodes normal. Neurologic: Alert and oriented X 3, normal strength and tone. Normal symmetric reflexes. Normal coordination and gait    Assessment:    Healthy female exam.      Plan:     Anticipatory guidance given including wearing seatbelts, smoke detectors in the home, increasing physical activity, increasing p.o. intake of water and vegetables. -We will obtain labs -Pap up-to-date done 10/21/2018 Next pap in 2025. -Mammogram done 04/02/2018 -Given handout -Next CPE in 1 year See After Visit Summary for Counseling Recommendations    Subclinical hypothyroidism -Not currently on medication -We will obtain labs to evaluate thyroid function - Plan: TSH, T4, free, Thyroglobulin AB, Thyroid Peroxidase Antibodies (TPO) (REFL)  Need for influenza vaccination -influenza vaccine given this visit  Vitamin D deficiency - Plan: Vitamin D, 25-hydroxy  Hair changes -We will obtain labs to evaluate thyroid function - Plan: Vitamin B12, Vitamin D, 25-hydroxy, Magnesium, Hemoglobin A1c, Thyroglobulin AB, Thyroid Peroxidase Antibodies (TPO) (REFL)  Other migraine without status migrainosus, not intractable -Continue supportive care to avoid headaches including getting plenty of rest, staying hydrated, and decreasing stress -Continue Nurtec -Continue follow-up with neurology - Plan: CBC with Differential/Platelet, CMP with eGFR([facility]), Magnesium, Hemoglobin A1c  Screening for cholesterol level  - Plan: Lipid panel  Anxiety -GAD-7 score 3 -PHQ-9 score 4 -Continue self-care-consider counselor -Medication not indicated at this time -We will continue to monitor  Follow-up as needed in the next month  Grier Mitts, MD

## 2019-11-23 LAB — CBC WITH DIFFERENTIAL/PLATELET
Absolute Monocytes: 402 cells/uL (ref 200–950)
Basophils Absolute: 37 cells/uL (ref 0–200)
Basophils Relative: 0.5 %
Eosinophils Absolute: 277 cells/uL (ref 15–500)
Eosinophils Relative: 3.8 %
HCT: 37.4 % (ref 35.0–45.0)
Hemoglobin: 12.5 g/dL (ref 11.7–15.5)
Lymphs Abs: 2110 cells/uL (ref 850–3900)
MCH: 31.9 pg (ref 27.0–33.0)
MCHC: 33.4 g/dL (ref 32.0–36.0)
MCV: 95.4 fL (ref 80.0–100.0)
MPV: 10.8 fL (ref 7.5–12.5)
Monocytes Relative: 5.5 %
Neutro Abs: 4475 cells/uL (ref 1500–7800)
Neutrophils Relative %: 61.3 %
Platelets: 178 10*3/uL (ref 140–400)
RBC: 3.92 10*6/uL (ref 3.80–5.10)
RDW: 11.8 % (ref 11.0–15.0)
Total Lymphocyte: 28.9 %
WBC: 7.3 10*3/uL (ref 3.8–10.8)

## 2019-11-23 LAB — COMPLETE METABOLIC PANEL WITH GFR
AG Ratio: 1.5 (calc) (ref 1.0–2.5)
ALT: 15 U/L (ref 6–29)
AST: 19 U/L (ref 10–30)
Albumin: 4.3 g/dL (ref 3.6–5.1)
Alkaline phosphatase (APISO): 48 U/L (ref 31–125)
BUN: 8 mg/dL (ref 7–25)
CO2: 25 mmol/L (ref 20–32)
Calcium: 8.9 mg/dL (ref 8.6–10.2)
Chloride: 103 mmol/L (ref 98–110)
Creat: 0.54 mg/dL (ref 0.50–1.10)
GFR, Est African American: 138 mL/min/{1.73_m2} (ref >=60)
GFR, Est Non African American: 119 mL/min/{1.73_m2} (ref >=60)
Globulin: 2.8 g/dL (calc) (ref 1.9–3.7)
Glucose, Bld: 72 mg/dL (ref 65–99)
Potassium: 4 mmol/L (ref 3.5–5.3)
Sodium: 139 mmol/L (ref 135–146)
Total Bilirubin: 0.6 mg/dL (ref 0.2–1.2)
Total Protein: 7.1 g/dL (ref 6.1–8.1)

## 2019-11-23 LAB — TEST AUTHORIZATION

## 2019-11-23 LAB — MAGNESIUM: Magnesium: 2 mg/dL (ref 1.5–2.5)

## 2019-11-23 LAB — TSH: TSH: 2.59 mIU/L

## 2019-11-23 LAB — LIPID PANEL
Cholesterol: 183 mg/dL (ref <200)
HDL: 65 mg/dL (ref >=50)
LDL Cholesterol (Calc): 105 mg/dL (calc) — ABNORMAL HIGH
Non-HDL Cholesterol (Calc): 118 mg/dL (calc) (ref <130)
Total CHOL/HDL Ratio: 2.8 (calc) (ref <5.0)
Triglycerides: 52 mg/dL (ref <150)

## 2019-11-23 LAB — T4, FREE: Free T4: 1.1 ng/dL (ref 0.8–1.8)

## 2019-11-23 LAB — HEMOGLOBIN A1C
Hgb A1c MFr Bld: 5 % of total Hgb (ref <5.7)
Mean Plasma Glucose: 97 (calc)
eAG (mmol/L): 5.4 (calc)

## 2019-11-23 LAB — VITAMIN D 25 HYDROXY (VIT D DEFICIENCY, FRACTURES): Vit D, 25-Hydroxy: 39 ng/mL (ref 30–100)

## 2019-11-23 LAB — THYROID PEROXIDASE ANTIBODIES (TPO) (REFL): Thyroperoxidase Ab SerPl-aCnc: 1 IU/mL (ref <9)

## 2019-11-23 LAB — THYROGLOBULIN ANTIBODY: Thyroglobulin Ab: 1 IU/mL (ref <=1)

## 2019-11-23 LAB — VITAMIN B12: Vitamin B-12: 483 pg/mL (ref 200–1100)

## 2019-11-24 DIAGNOSIS — F419 Anxiety disorder, unspecified: Secondary | ICD-10-CM | POA: Diagnosis not present

## 2019-11-24 DIAGNOSIS — F4310 Post-traumatic stress disorder, unspecified: Secondary | ICD-10-CM | POA: Diagnosis not present

## 2019-11-24 DIAGNOSIS — G43009 Migraine without aura, not intractable, without status migrainosus: Secondary | ICD-10-CM | POA: Diagnosis not present

## 2019-12-27 ENCOUNTER — Other Ambulatory Visit: Payer: Self-pay | Admitting: Family Medicine

## 2019-12-27 DIAGNOSIS — B009 Herpesviral infection, unspecified: Secondary | ICD-10-CM

## 2020-02-22 DIAGNOSIS — Z1152 Encounter for screening for COVID-19: Secondary | ICD-10-CM | POA: Diagnosis not present

## 2020-02-24 ENCOUNTER — Telehealth (INDEPENDENT_AMBULATORY_CARE_PROVIDER_SITE_OTHER): Payer: BC Managed Care – PPO | Admitting: Family Medicine

## 2020-02-24 DIAGNOSIS — Z20822 Contact with and (suspected) exposure to covid-19: Secondary | ICD-10-CM

## 2020-02-24 DIAGNOSIS — R059 Cough, unspecified: Secondary | ICD-10-CM

## 2020-02-24 MED ORDER — BENZONATATE 100 MG PO CAPS: 100.0000 mg | ORAL_CAPSULE | Freq: Three times a day (TID) | ORAL | 0 refills | Status: DC | PRN

## 2020-02-24 NOTE — Patient Instructions (Addendum)
  HOME CARE TIPS:  -[facility] testing information: https://www.[facility].org/ OR [phone] Most pharmacies also offer testing and home test kits.  -I sent the medication(s) we discussed to your pharmacy: Meds ordered this encounter  Medications  . benzonatate (TESSALON PERLES) 100 MG capsule    Sig: Take 1 capsule (100 mg total) by mouth 3 (three) times daily as needed.    Dispense:  20 capsule    Refill:  0     -can use tylenol or aleve if needed for fevers, aches and pains per instructions  -can use nasal saline a few times per day if you have nasal congestion; sometimes  a short course of Afrin nasal spray for 3 days can help with symptoms as well  -stay hydrated, drink plenty of fluids and eat small healthy meals - avoid dairy  -can take 1000 IU (1mcg) Vit D3 and 100-500 mg of Vit C daily per instructions  -If the Covid test is positive, check out the CDC website for more information on home care, transmission and treatment for COVID19  -follow up with your doctor in 2-3 days unless improving and feeling better  -stay home while sick, except to seek medical care, and if you have [place] ideally it would be best to stay home for a full 10 days since the onset of symptoms PLUS one day of no fever and feeling better. Wear a good mask (such as N95 or KN95) if around others to reduce the risk of transmission.  It was nice to meet you today, and I really hope you are feeling better soon. I help [facility] out with telemedicine visits on Tuesdays and Thursdays and am available for visits on those days. If you have any concerns or questions following this visit please schedule a follow up visit with your Primary Care doctor or seek care at a local urgent care clinic to avoid delays in care.    Seek in person care or schedule a follow up video visit promptly if your symptoms worsen, new concerns arise or you are not improving with treatment.  Call 911 and/or seek emergency care if your symptoms are severe or life threatening.

## 2020-02-24 NOTE — Progress Notes (Signed)
Virtual Visit via Video Note  I connected with Elizabeth Contreras  on 02/24/20 at 11:40 AM EST by a video enabled telemedicine application and verified that I am speaking with the correct person using two identifiers.  Location patient: home, [place] Location provider:work or home office Persons participating in the virtual visit: patient, provider  I discussed the limitations of evaluation and management by telemedicine and the availability of in person appointments. The patient expressed understanding and agreed to proceed.   HPI:  Acute telemedicine visit for flu like symptoms and close covid exposures: -Onset: 2 days ago (had some mild symptoms starting 4 days ago with fatigue and nausea), she did a covid test the day she started feeling sick and it was negative -Symptoms include: sore throat, body aches, runny nose, cough, low grade fever initially -husband got sick last week and tested positive for covid on PCR test -O2 at 99 -Denies: CP, SOB, NVD, inability to eat/drink/get out of bed -Has tried: tylenol, vit C, vit D, zinc -Pertinent past medical history: mild intermittent asthma - uses albuterol rarely -Pertinent medication allergies: topamax -COVID-19 vaccine status: fully vaccinated and had booster -denies any chance of pregnancy  ROS: See pertinent positives and negatives per HPI.  Past Medical History:  Diagnosis Date  . Asthma   . Common migraine with intractable migraine 06/20/2016  . Herpes   . Migraine without aura   . PCOS (polycystic ovarian syndrome)   . PONV (postoperative nausea and vomiting)   . PTSD (post-traumatic stress disorder) 2015  . Sinusitis   . 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 Thyroid disease     Past Surgical History:  Procedure Laterality Date  . COLPOSCOPY    . NASAL SINUS SURGERY  07/2015  . SHOULDER ARTHROSCOPY WITH BICEPS TENDON REPAIR Left 07/21/2019   Procedure: LEFT SHOULDER ARTHROSCOPY WITH BICEPS TENODESIS WITH DEBRIDEMENT  OF LABRUM;  Surgeon: Leandrew Koyanagi, MD;  Location: [facility];  Service: Orthopedics;  Laterality: Left;     Current Outpatient Medications:  .  benzonatate (TESSALON PERLES) 100 MG capsule, Take 1 capsule (100 mg total) by mouth 3 (three) times daily as needed., Disp: 20 capsule, Rfl: 0 .  acetaminophen (TYLENOL) 325 MG tablet, Take 650 mg by mouth every 6 (six) hours as needed., Disp: , Rfl:  .  ALPRAZolam (XANAX) 0.25 MG tablet, Take 1 tablet (0.25 mg total) by mouth 2 (two) times daily as needed for anxiety., Disp: 20 tablet, Rfl: 0 .  Cholecalciferol (VITAMIN D) 2000 units CAPS, Take by mouth., Disp: , Rfl:  .  cyclobenzaprine (FLEXERIL) 5 MG tablet, TAKE 1 TABLET BY MOUTH THREE TIMES A DAY AS NEEDED FOR MUSCLE SPASMS, Disp: 30 tablet, Rfl: 1 .  fluticasone (FLONASE) 50 MCG/ACT nasal spray, Place 1 spray into both nostrils daily., Disp: 16 g, Rfl: 3 .  ibuprofen (ADVIL,MOTRIN) 200 MG tablet, Take 200 mg by mouth every 6 (six) hours as needed., Disp: , Rfl:  .  Loratadine (CLARITIN PO), Take by mouth daily at 12 noon., Disp: , Rfl:  .  PROAIR HFA 108 (90 Base) MCG/ACT inhaler, INHALE 2 PUFFS EVERY 6 HOURS AS NEEDED FOR WHEEZE OR SHORTNESS OF BREATH, Disp: 8.5 Inhaler, Rfl: 1 .  Probiotic Product (PROBIOTIC-10 PO), Take by mouth daily., Disp: , Rfl:  .  riboflavin (VITAMIN B-2) 100 MG TABS tablet, Take 100 mg by mouth daily., Disp: , Rfl:  .  Rimegepant Sulfate (NURTEC) 75 MG TBDP,  Take by mouth., Disp: , Rfl:  .  TURMERIC PO, Take by mouth., Disp: , Rfl:  .  valACYclovir (VALTREX) 500 MG tablet, TAKE 1 TABLET BY MOUTH EVERY DAY, Disp: 90 tablet, Rfl: 0  EXAM:  VITALS per patient if applicable:  GENERAL: alert, oriented, appears well and in no acute distress  HEENT: atraumatic, conjunttiva clear, no obvious abnormalities on inspection of external nose and ears  NECK: normal movements of the head and neck  LUNGS: on inspection no signs of respiratory distress, breathing  rate appears normal, no obvious gross SOB, gasping or wheezing  CV: no obvious cyanosis  MS: moves all visible extremities without noticeable abnormality  PSYCH/NEURO: pleasant and cooperative, no obvious depression or anxiety, speech and thought processing grossly intact  ASSESSMENT AND PLAN:  Discussed the following assessment and plan:  Close exposure to COVID-19 virus  Cough  -we discussed possible serious and likely etiologies, options for evaluation and workup, limitations of telemedicine visit vs in person visit, treatment, treatment risks and precautions. Pt prefers to treat via telemedicine empirically rather than in person at this moment. Query COVID19, mild influenza, viral illness vs other. Suspect [phone] with false negative test most likely. Discussed treatment options, potential complications, isolation and precautions. She opted for symptomatic care with oral hydration, Tessalon for cough, alb if needed, analgesics, vitamins, etc (summarized in pt instructions.) Work/School slipped offered: declined Advised to seek prompt follow up video visit or in person care if worsening, new symptoms arise, or if is not improving with treatment.  I discussed the assessment and treatment plan with the patient. The patient was provided an opportunity to ask questions and all were answered. The patient agreed with the plan and demonstrated an understanding of the instructions.     Lucretia Kern, DO

## 2020-03-26 ENCOUNTER — Other Ambulatory Visit: Payer: Self-pay | Admitting: Family Medicine

## 2020-03-26 DIAGNOSIS — B009 Herpesviral infection, unspecified: Secondary | ICD-10-CM

## 2020-03-29 ENCOUNTER — Encounter: Payer: Self-pay | Admitting: Family Medicine

## 2020-03-29 ENCOUNTER — Telehealth (INDEPENDENT_AMBULATORY_CARE_PROVIDER_SITE_OTHER): Payer: BC Managed Care – PPO | Admitting: Family Medicine

## 2020-03-29 VITALS — Wt 133.0 lb

## 2020-03-29 DIAGNOSIS — R0982 Postnasal drip: Secondary | ICD-10-CM | POA: Diagnosis not present

## 2020-03-29 DIAGNOSIS — J302 Other seasonal allergic rhinitis: Secondary | ICD-10-CM

## 2020-03-29 MED ORDER — FLUTICASONE PROPIONATE 50 MCG/ACT NA SUSP: 1.0000 | Freq: Every day | NASAL | 6 refills | Status: DC

## 2020-03-29 MED ORDER — CETIRIZINE HCL 10 MG PO TABS: 10.0000 mg | ORAL_TABLET | Freq: Every day | ORAL | 11 refills | Status: AC

## 2020-03-29 NOTE — Progress Notes (Signed)
. Virtual Visit via Video Note  I connected with Elizabeth Contreras on 03/29/20 at  2:00 PM EST by a video enabled telemedicine application 2/2 [id number]-06 pandemic and verified that I am speaking with the correct person using two identifiers.  Location patient: home Location provider:work or home office Persons participating in the virtual visit: patient, provider  I discussed the limitations of evaluation and management by telemedicine and the availability of in person appointments. The patient expressed understanding and agreed to proceed.   HPI: Pt had COVID one month ago.  Noticed post nasal drip in the last wk, throat irritation, and cough.  Pt started using flonase again today.  On claritin x yrs.   Concerned was having post COVID complications.  Having her regular HAs.  Denies fever, chills, nausea, vomiting.  Pain/pressure, facial pain/pressure, rhinorrhea.  ROS: See pertinent positives and negatives per HPI.  Past Medical History:  Diagnosis Date  . Asthma   . Common migraine with intractable migraine 06/20/2016  . Herpes   . Migraine without aura   . PCOS (polycystic ovarian syndrome)   . PONV (postoperative nausea and vomiting)   . PTSD (post-traumatic stress disorder) 2015  . Sinusitis   . 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 Thyroid disease     Past Surgical History:  Procedure Laterality Date  . COLPOSCOPY    . NASAL SINUS SURGERY  07/2015  . SHOULDER ARTHROSCOPY WITH BICEPS TENDON REPAIR Left 07/21/2019   Procedure: LEFT SHOULDER ARTHROSCOPY WITH BICEPS TENODESIS WITH DEBRIDEMENT OF LABRUM;  Surgeon: Leandrew Koyanagi, MD;  Location: [facility];  Service: Orthopedics;  Laterality: Left;    Family History  Problem Relation Age of Onset  . Migraines Mother   . Aneurysm Mother   . Cancer Maternal Aunt        ovarian  . Cancer Paternal Uncle        colon      Current Outpatient Medications:  .  acetaminophen (TYLENOL) 325 MG tablet,  Take 650 mg by mouth every 6 (six) hours as needed., Disp: , Rfl:  .  ALPRAZolam (XANAX) 0.25 MG tablet, Take 1 tablet (0.25 mg total) by mouth 2 (two) times daily as needed for anxiety., Disp: 20 tablet, Rfl: 0 .  Cholecalciferol (VITAMIN D) 2000 units CAPS, Take by mouth., Disp: , Rfl:  .  cyclobenzaprine (FLEXERIL) 5 MG tablet, TAKE 1 TABLET BY MOUTH THREE TIMES A DAY AS NEEDED FOR MUSCLE SPASMS, Disp: 30 tablet, Rfl: 1 .  fluticasone (FLONASE) 50 MCG/ACT nasal spray, Place 1 spray into both nostrils daily., Disp: 16 g, Rfl: 3 .  ibuprofen (ADVIL,MOTRIN) 200 MG tablet, Take 200 mg by mouth every 6 (six) hours as needed., Disp: , Rfl:  .  Loratadine (CLARITIN PO), Take by mouth daily at 12 noon., Disp: , Rfl:  .  PROAIR HFA 108 (90 Base) MCG/ACT inhaler, INHALE 2 PUFFS EVERY 6 HOURS AS NEEDED FOR WHEEZE OR SHORTNESS OF BREATH, Disp: 8.5 Inhaler, Rfl: 1 .  Probiotic Product (PROBIOTIC-10 PO), Take by mouth daily., Disp: , Rfl:  .  riboflavin (VITAMIN B-2) 100 MG TABS tablet, Take 100 mg by mouth daily., Disp: , Rfl:  .  Rimegepant Sulfate (NURTEC) 75 MG TBDP, Take by mouth., Disp: , Rfl:  .  TURMERIC PO, Take by mouth., Disp: , Rfl:  .  valACYclovir (VALTREX) 500 MG tablet, TAKE 1 TABLET BY MOUTH EVERY DAY, Disp: 90 tablet, Rfl: 0 .  benzonatate (TESSALON PERLES) 100 MG capsule, Take 1 capsule (100 mg total) by mouth 3 (three) times daily as needed. (Patient not taking: Reported on 03/29/2020), Disp: 20 capsule, Rfl: 0  EXAM:  VITALS per patient if applicable: RR between 81-85 bpm  GENERAL: alert, oriented, appears well and in no acute distress  HEENT: atraumatic, conjunctiva clear, no obvious abnormalities on inspection of external nose and ears  NECK: normal movements of the head and neck  LUNGS: on inspection no signs of respiratory distress, breathing rate appears normal, no obvious gross SOB, gasping or wheezing  CV: no obvious cyanosis  MS: moves all visible extremities without  noticeable abnormality  PSYCH/NEURO: pleasant and cooperative, no obvious depression or anxiety, speech and thought processing grossly intact  ASSESSMENT AND PLAN:  Discussed the following assessment and plan:  Seasonal allergies -Will d/c Claritin as has been on medication times years. -Will start Zyrtec 10 mg daily -Continue Flonase - Plan: cetirizine (ZYRTEC) 10 MG tablet, fluticasone (FLONASE) 50 MCG/ACT nasal spray  Post-nasal drainage -Likely 2/2 allergies.  Also consider GERD/silent reflux -We will switch Claritin to Zyrtec to see if symptoms improve -Continue using Flonase as needed -For continued or worsening symptoms consider PPI. - Plan: cetirizine (ZYRTEC) 10 MG tablet, fluticasone (FLONASE) 50 MCG/ACT nasal spray  Follow-up as needed   I discussed the assessment and treatment plan with the patient. The patient was provided an opportunity to ask questions and all were answered. The patient agreed with the plan and demonstrated an understanding of the instructions.   The patient was advised to call back or seek an in-person evaluation if the symptoms worsen or if the condition fails to improve as anticipated.  Billie Ruddy, MD

## 2020-05-16 DIAGNOSIS — F411 Generalized anxiety disorder: Secondary | ICD-10-CM | POA: Diagnosis not present

## 2020-05-23 DIAGNOSIS — F411 Generalized anxiety disorder: Secondary | ICD-10-CM | POA: Diagnosis not present

## 2020-05-24 DIAGNOSIS — G43009 Migraine without aura, not intractable, without status migrainosus: Secondary | ICD-10-CM | POA: Diagnosis not present

## 2020-05-30 DIAGNOSIS — F411 Generalized anxiety disorder: Secondary | ICD-10-CM | POA: Diagnosis not present

## 2020-06-06 DIAGNOSIS — F411 Generalized anxiety disorder: Secondary | ICD-10-CM | POA: Diagnosis not present

## 2020-06-13 DIAGNOSIS — F411 Generalized anxiety disorder: Secondary | ICD-10-CM | POA: Diagnosis not present

## 2020-06-20 DIAGNOSIS — F411 Generalized anxiety disorder: Secondary | ICD-10-CM | POA: Diagnosis not present

## 2020-06-23 ENCOUNTER — Other Ambulatory Visit: Payer: Self-pay | Admitting: Family Medicine

## 2020-06-23 DIAGNOSIS — B009 Herpesviral infection, unspecified: Secondary | ICD-10-CM

## 2020-06-27 DIAGNOSIS — F411 Generalized anxiety disorder: Secondary | ICD-10-CM | POA: Diagnosis not present

## 2020-07-04 DIAGNOSIS — F411 Generalized anxiety disorder: Secondary | ICD-10-CM | POA: Diagnosis not present

## 2020-07-11 DIAGNOSIS — F411 Generalized anxiety disorder: Secondary | ICD-10-CM | POA: Diagnosis not present

## 2020-07-13 ENCOUNTER — Ambulatory Visit: Payer: Self-pay

## 2020-07-13 ENCOUNTER — Other Ambulatory Visit: Payer: Self-pay

## 2020-07-13 ENCOUNTER — Encounter: Payer: Self-pay | Admitting: Orthopaedic Surgery

## 2020-07-13 ENCOUNTER — Ambulatory Visit: Payer: BC Managed Care – PPO | Admitting: Orthopaedic Surgery

## 2020-07-13 DIAGNOSIS — G8929 Other chronic pain: Secondary | ICD-10-CM

## 2020-07-13 DIAGNOSIS — M25511 Pain in right shoulder: Secondary | ICD-10-CM

## 2020-07-13 NOTE — Progress Notes (Signed)
Office Visit Note   Patient: Elizabeth Contreras           Date of Birth: Jul 05, 1980           MRN: [id number] Visit Date: 07/13/2020              Requested by: Elizabeth Ruddy, MD [place],  [place] [address] PCP: Elizabeth Ruddy, MD   Assessment & Plan: Visit Diagnoses:  1. Chronic right shoulder pain     Plan: Impression is right shoulder pain concerning for similar pathology as the left shoulder in which she had a SLAP tear and paralabral cyst.  Given the history, we need to obtain MR arthrogram of the right shoulder to assess for structural abnormalities.  I will call Elizabeth Contreras with the MRI results.  Follow-Up Instructions: Return if symptoms worsen or fail to improve.   Orders:  Orders Placed This Encounter  Procedures   XR Shoulder Right   No orders of the defined types were placed in this encounter.     Procedures: No procedures performed   Clinical Data: No additional findings.   Subjective: Chief Complaint  Patient presents with   Right Shoulder - Pain    Elizabeth Contreras is someone I know from high school comes in for right shoulder pain for about 2 months.  I took care of her last year for the other shoulder in which she had a large SLAP tear and a spinal glenoid cyst which then ultimately required biceps tenodesis.  She has done well from the surgery overall.  The right shoulder symptoms are very reminiscent of the left shoulder symptoms and therefore she wants to get this checked out earlier rather than later.  She reports no injuries.  She has been doing yoga for a couple months to try to improve this.  Denies any numbness and tingling.  The pain is localized to the lateral arm and the anterior aspect of the shoulder.   Review of Systems   Objective: Vital Signs: There were no vitals taken for this visit.  Physical Exam  Ortho Exam Right shoulder shows full range of motion.  Mildly positive O'Brien sign.  Mildly positive Speed test.  Manual  muscle testing of the rotator cuff is normal. Specialty Comments:  No specialty comments available.  Imaging: XR Shoulder Right  Result Date: 07/13/2020 No acute or structural abnormalities    PMFS History: Patient Active Problem List   Diagnosis Date Noted   Superior labrum anterior-to-posterior (SLAP) tear of left shoulder 06/04/2019   Anxiety 02/26/2019   PTSD (post-traumatic stress disorder) 02/26/2019   Subclinical hypothyroidism 11/21/2016   Vitamin D deficiency 11/21/2016   Common migraine with intractable migraine 06/20/2016   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    Thyroid disease     Family History  Problem Relation Age of Onset   Migraines Mother    Aneurysm Mother    Cancer Maternal Aunt        ovarian   Cancer Paternal Uncle        colon    Past Surgical History:  Procedure Laterality Date   COLPOSCOPY  NASAL SINUS SURGERY  07/2015   SHOULDER ARTHROSCOPY WITH BICEPS TENDON REPAIR Left 07/21/2019   Procedure: LEFT SHOULDER ARTHROSCOPY WITH BICEPS TENODESIS WITH DEBRIDEMENT OF LABRUM;  Surgeon: Leandrew Koyanagi, MD;  Location: [facility];  Service: Orthopedics;  Laterality: Left;   Social History   Occupational History    Comment: unemployed  Tobacco Use   Smoking status: Never   Smokeless tobacco: Never  Vaping Use   Vaping Use: Never used  Substance and Sexual Activity   Alcohol use: Yes    Comment: rarely   Drug use: No   Sexual activity: Yes    Partners: Male    Birth control/protection: None

## 2020-07-18 DIAGNOSIS — F411 Generalized anxiety disorder: Secondary | ICD-10-CM | POA: Diagnosis not present

## 2020-07-22 DIAGNOSIS — T63441A Toxic effect of venom of bees, accidental (unintentional), initial encounter: Secondary | ICD-10-CM | POA: Diagnosis not present

## 2020-07-23 DIAGNOSIS — T63481D Toxic effect of venom of other arthropod, accidental (unintentional), subsequent encounter: Secondary | ICD-10-CM | POA: Diagnosis not present

## 2020-07-25 DIAGNOSIS — F411 Generalized anxiety disorder: Secondary | ICD-10-CM | POA: Diagnosis not present

## 2020-07-28 ENCOUNTER — Encounter: Payer: Self-pay | Admitting: Family Medicine

## 2020-07-28 ENCOUNTER — Other Ambulatory Visit: Payer: Self-pay

## 2020-07-28 ENCOUNTER — Ambulatory Visit: Payer: BC Managed Care – PPO | Admitting: Family Medicine

## 2020-07-28 VITALS — BP 99/62 | HR 68 | Temp 98.0°F

## 2020-07-28 DIAGNOSIS — S70362A Insect bite (nonvenomous), left thigh, initial encounter: Secondary | ICD-10-CM

## 2020-07-28 DIAGNOSIS — S90862A Insect bite (nonvenomous), left foot, initial encounter: Secondary | ICD-10-CM | POA: Diagnosis not present

## 2020-07-28 DIAGNOSIS — W57XXXA Bitten or stung by nonvenomous insect and other nonvenomous arthropods, initial encounter: Secondary | ICD-10-CM | POA: Diagnosis not present

## 2020-07-28 DIAGNOSIS — S80861A Insect bite (nonvenomous), right lower leg, initial encounter: Secondary | ICD-10-CM

## 2020-07-28 DIAGNOSIS — F4310 Post-traumatic stress disorder, unspecified: Secondary | ICD-10-CM

## 2020-07-28 DIAGNOSIS — F40243 Fear of flying: Secondary | ICD-10-CM

## 2020-07-28 MED ORDER — ALPRAZOLAM 0.25 MG PO TABS: 0.2500 mg | ORAL_TABLET | Freq: Two times a day (BID) | ORAL | 0 refills | Status: DC | PRN

## 2020-07-28 NOTE — Progress Notes (Signed)
Subjective:    Patient ID: Elizabeth Contreras, female    DOB: 1980/11/22, 40 y.o.   MRN: [id number]  Chief Complaint  Patient presents with   Follow-up    Bee stings, was seen in UC and given desoximetasone cream and pepcid ac    HPI Patient was seen today for f/u on yellow jacket stings.  Pt seen at [facility] on 6/18 and 6/19 for several yellowjacket stings.  Patient states she was stung on left foot, 3 times on the right calf, and 2 times on left thigh while in her backyard.  Areas were pruritic, erythematous, and painful.  Patient tried Benadryl, Zyrtec, and OTC cortisone cream.  Denied fever, chills, nausea, vomiting, SOB, tightness in throat, rash.  Patient given desoximetasone 0.25% cream.  Subsequently given Pepcid and prednisone taper.  Pt notes improvement in pruritis. Completed prednisone course today.  Pt previously given rx for xanax several yrs ago for anxiety while flying.  Pt inquires about refill as will have to travel for work several times this yr.  Planning on traveling to [place].  Has an upcoming MRI for right shoulder.  Followed by Ortho. 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    Thyroid disease     Allergies  Allergen Reactions   Topamax [Topiramate]     Cognitive slowing    ROS General: Denies fever, chills, night sweats, changes in weight, changes in appetite HEENT: Denies headaches, ear pain, changes in vision, rhinorrhea, sore throat CV: Denies CP, palpitations, SOB, orthopnea Pulm: Denies SOB, cough, wheezing GI: Denies abdominal pain, nausea, vomiting, diarrhea, constipation GU: Denies dysuria, hematuria, frequency, vaginal discharge Msk: Denies muscle cramps, joint  pains Neuro: Denies weakness, numbness, tingling Skin: Denies rashes, bruising  +several yellow jacket stings. Psych: Denies depression,hallucinations +anxiety with flying    Objective:    Blood pressure 99/62, pulse 68, temperature 98 F (36.7 C), temperature source Oral, SpO2 98 %.  Gen. Pleasant, well-nourished, in no distress, normal affect   HEENT: Valentine/AT, face symmetric, conjunctiva clear, no scleral icterus, PERRLA, EOMI, nares patent without drainage Lungs: no accessory muscle use Cardiovascular: RRR, no peripheral edema Musculoskeletal: No deformities, no cyanosis or clubbing, normal tone Neuro:  A&Ox3, CN II-XII intact, normal gait Skin:  Warm, dry.  Healing stings with ecchymosis on L midfoot, R calf, and L thigh.   Wt Readings from Last 3 Encounters:  03/29/20 133 lb (60.3 kg)  11/19/19 133 lb 6.4 oz (60.5 kg)  11/08/19 130 lb (59 kg)    Lab Results  Component Value Date   WBC 7.3 11/19/2019   HGB 12.5 11/19/2019   HCT 37.4 11/19/2019   PLT 178 11/19/2019   GLUCOSE 72 11/19/2019   CHOL 183 11/19/2019   TRIG 52 11/19/2019   HDL 65 11/19/2019   LDLCALC 105 (H) 11/19/2019   ALT 15 11/19/2019   AST 19 11/19/2019   NA 139 11/19/2019   K 4.0 11/19/2019   CL 103 11/19/2019   CREATININE 0.54 11/19/2019   BUN 8 11/19/2019   CO2 25 11/19/2019   TSH 2.59 11/19/2019   HGBA1C 5.0 11/19/2019    Assessment/Plan:  Bite of Hemiptera  species -Improving -Continue supportive care including ice, elevation, Benadryl as needed -Given precautions.  Monitor for s/s of infection  Anxiety with flying  -PDMP reviewed - Plan: ALPRAZolam (XANAX) 0.25 MG tablet  PTSD (post-traumatic stress disorder)  - Plan: ALPRAZolam (XANAX) 0.25 MG tablet  F/u prn  Grier Mitts, MD

## 2020-07-31 ENCOUNTER — Other Ambulatory Visit: Payer: BC Managed Care – PPO

## 2020-08-01 ENCOUNTER — Other Ambulatory Visit: Payer: BC Managed Care – PPO

## 2020-08-03 DIAGNOSIS — F411 Generalized anxiety disorder: Secondary | ICD-10-CM | POA: Diagnosis not present

## 2020-08-08 DIAGNOSIS — F411 Generalized anxiety disorder: Secondary | ICD-10-CM | POA: Diagnosis not present

## 2020-08-11 ENCOUNTER — Ambulatory Visit
Admission: RE | Admit: 2020-08-11 | Discharge: 2020-08-11 | Disposition: A | Discharge status: Home / Self Care | Payer: BC Managed Care – PPO | Source: Ambulatory Visit | Attending: Orthopaedic Surgery | Admitting: Orthopaedic Surgery

## 2020-08-11 ENCOUNTER — Other Ambulatory Visit: Payer: Self-pay

## 2020-08-11 DIAGNOSIS — G8929 Other chronic pain: Secondary | ICD-10-CM

## 2020-08-11 DIAGNOSIS — M25511 Pain in right shoulder: Secondary | ICD-10-CM | POA: Diagnosis not present

## 2020-08-11 MED ORDER — IOPAMIDOL (ISOVUE-M 200) INJECTION 41%
12.0000 mL | Freq: Once | INTRAMUSCULAR | Status: AC
  Administered 2020-08-11: 12 mL via INTRA_ARTICULAR

## 2020-08-14 ENCOUNTER — Encounter: Payer: Self-pay | Admitting: Orthopaedic Surgery

## 2020-08-14 NOTE — Telephone Encounter (Signed)
I called her and discussed options.  She'll just go with rest and activity modification for now.

## 2020-08-15 DIAGNOSIS — F411 Generalized anxiety disorder: Secondary | ICD-10-CM | POA: Diagnosis not present

## 2020-08-29 DIAGNOSIS — H04123 Dry eye syndrome of bilateral lacrimal glands: Secondary | ICD-10-CM | POA: Diagnosis not present

## 2020-08-31 DIAGNOSIS — F411 Generalized anxiety disorder: Secondary | ICD-10-CM | POA: Diagnosis not present

## 2020-09-05 DIAGNOSIS — F411 Generalized anxiety disorder: Secondary | ICD-10-CM | POA: Diagnosis not present

## 2020-09-12 DIAGNOSIS — F411 Generalized anxiety disorder: Secondary | ICD-10-CM | POA: Diagnosis not present

## 2020-10-03 DIAGNOSIS — F419 Anxiety disorder, unspecified: Secondary | ICD-10-CM | POA: Diagnosis not present

## 2020-10-05 DIAGNOSIS — D2361 Other benign neoplasm of skin of right upper limb, including shoulder: Secondary | ICD-10-CM | POA: Diagnosis not present

## 2020-10-05 DIAGNOSIS — D2372 Other benign neoplasm of skin of left lower limb, including hip: Secondary | ICD-10-CM | POA: Diagnosis not present

## 2020-10-05 DIAGNOSIS — L821 Other seborrheic keratosis: Secondary | ICD-10-CM | POA: Diagnosis not present

## 2020-10-05 DIAGNOSIS — L814 Other melanin hyperpigmentation: Secondary | ICD-10-CM | POA: Diagnosis not present

## 2020-10-15 ENCOUNTER — Other Ambulatory Visit: Payer: Self-pay | Admitting: Family Medicine

## 2020-10-15 ENCOUNTER — Encounter: Payer: Self-pay | Admitting: Family Medicine

## 2020-10-15 DIAGNOSIS — B009 Herpesviral infection, unspecified: Secondary | ICD-10-CM

## 2020-10-27 DIAGNOSIS — F4311 Post-traumatic stress disorder, acute: Secondary | ICD-10-CM | POA: Diagnosis not present

## 2020-11-09 DIAGNOSIS — M26619 Adhesions and ankylosis of temporomandibular joint, unspecified side: Secondary | ICD-10-CM | POA: Diagnosis not present

## 2020-11-09 DIAGNOSIS — M9902 Segmental and somatic dysfunction of thoracic region: Secondary | ICD-10-CM | POA: Diagnosis not present

## 2020-11-09 DIAGNOSIS — M9901 Segmental and somatic dysfunction of cervical region: Secondary | ICD-10-CM | POA: Diagnosis not present

## 2020-11-09 DIAGNOSIS — M9907 Segmental and somatic dysfunction of upper extremity: Secondary | ICD-10-CM | POA: Diagnosis not present

## 2020-11-10 DIAGNOSIS — F4311 Post-traumatic stress disorder, acute: Secondary | ICD-10-CM | POA: Diagnosis not present

## 2020-11-14 DIAGNOSIS — F419 Anxiety disorder, unspecified: Secondary | ICD-10-CM | POA: Diagnosis not present

## 2020-11-15 DIAGNOSIS — M9902 Segmental and somatic dysfunction of thoracic region: Secondary | ICD-10-CM | POA: Diagnosis not present

## 2020-11-15 DIAGNOSIS — M9901 Segmental and somatic dysfunction of cervical region: Secondary | ICD-10-CM | POA: Diagnosis not present

## 2020-11-15 DIAGNOSIS — M26619 Adhesions and ankylosis of temporomandibular joint, unspecified side: Secondary | ICD-10-CM | POA: Diagnosis not present

## 2020-11-15 DIAGNOSIS — M9907 Segmental and somatic dysfunction of upper extremity: Secondary | ICD-10-CM | POA: Diagnosis not present

## 2020-11-17 DIAGNOSIS — M9907 Segmental and somatic dysfunction of upper extremity: Secondary | ICD-10-CM | POA: Diagnosis not present

## 2020-11-17 DIAGNOSIS — M26619 Adhesions and ankylosis of temporomandibular joint, unspecified side: Secondary | ICD-10-CM | POA: Diagnosis not present

## 2020-11-17 DIAGNOSIS — M9901 Segmental and somatic dysfunction of cervical region: Secondary | ICD-10-CM | POA: Diagnosis not present

## 2020-11-17 DIAGNOSIS — F4311 Post-traumatic stress disorder, acute: Secondary | ICD-10-CM | POA: Diagnosis not present

## 2020-11-17 DIAGNOSIS — M9902 Segmental and somatic dysfunction of thoracic region: Secondary | ICD-10-CM | POA: Diagnosis not present

## 2020-11-20 DIAGNOSIS — M9907 Segmental and somatic dysfunction of upper extremity: Secondary | ICD-10-CM | POA: Diagnosis not present

## 2020-11-20 DIAGNOSIS — M9901 Segmental and somatic dysfunction of cervical region: Secondary | ICD-10-CM | POA: Diagnosis not present

## 2020-11-20 DIAGNOSIS — M9902 Segmental and somatic dysfunction of thoracic region: Secondary | ICD-10-CM | POA: Diagnosis not present

## 2020-11-20 DIAGNOSIS — M26619 Adhesions and ankylosis of temporomandibular joint, unspecified side: Secondary | ICD-10-CM | POA: Diagnosis not present

## 2020-11-23 DIAGNOSIS — M26619 Adhesions and ankylosis of temporomandibular joint, unspecified side: Secondary | ICD-10-CM | POA: Diagnosis not present

## 2020-11-23 DIAGNOSIS — M9902 Segmental and somatic dysfunction of thoracic region: Secondary | ICD-10-CM | POA: Diagnosis not present

## 2020-11-23 DIAGNOSIS — M9907 Segmental and somatic dysfunction of upper extremity: Secondary | ICD-10-CM | POA: Diagnosis not present

## 2020-11-23 DIAGNOSIS — M9901 Segmental and somatic dysfunction of cervical region: Secondary | ICD-10-CM | POA: Diagnosis not present

## 2020-11-24 DIAGNOSIS — F4311 Post-traumatic stress disorder, acute: Secondary | ICD-10-CM | POA: Diagnosis not present

## 2020-11-28 DIAGNOSIS — M9902 Segmental and somatic dysfunction of thoracic region: Secondary | ICD-10-CM | POA: Diagnosis not present

## 2020-11-28 DIAGNOSIS — M26619 Adhesions and ankylosis of temporomandibular joint, unspecified side: Secondary | ICD-10-CM | POA: Diagnosis not present

## 2020-11-28 DIAGNOSIS — M9907 Segmental and somatic dysfunction of upper extremity: Secondary | ICD-10-CM | POA: Diagnosis not present

## 2020-11-28 DIAGNOSIS — M9901 Segmental and somatic dysfunction of cervical region: Secondary | ICD-10-CM | POA: Diagnosis not present

## 2020-12-01 DIAGNOSIS — F4311 Post-traumatic stress disorder, acute: Secondary | ICD-10-CM | POA: Diagnosis not present

## 2020-12-04 DIAGNOSIS — M9907 Segmental and somatic dysfunction of upper extremity: Secondary | ICD-10-CM | POA: Diagnosis not present

## 2020-12-04 DIAGNOSIS — M9901 Segmental and somatic dysfunction of cervical region: Secondary | ICD-10-CM | POA: Diagnosis not present

## 2020-12-04 DIAGNOSIS — M9902 Segmental and somatic dysfunction of thoracic region: Secondary | ICD-10-CM | POA: Diagnosis not present

## 2020-12-04 DIAGNOSIS — M26619 Adhesions and ankylosis of temporomandibular joint, unspecified side: Secondary | ICD-10-CM | POA: Diagnosis not present

## 2020-12-05 DIAGNOSIS — F419 Anxiety disorder, unspecified: Secondary | ICD-10-CM | POA: Diagnosis not present

## 2020-12-08 DIAGNOSIS — F4311 Post-traumatic stress disorder, acute: Secondary | ICD-10-CM | POA: Diagnosis not present

## 2020-12-11 DIAGNOSIS — M9901 Segmental and somatic dysfunction of cervical region: Secondary | ICD-10-CM | POA: Diagnosis not present

## 2020-12-11 DIAGNOSIS — M9902 Segmental and somatic dysfunction of thoracic region: Secondary | ICD-10-CM | POA: Diagnosis not present

## 2020-12-11 DIAGNOSIS — M26619 Adhesions and ankylosis of temporomandibular joint, unspecified side: Secondary | ICD-10-CM | POA: Diagnosis not present

## 2020-12-11 DIAGNOSIS — M9907 Segmental and somatic dysfunction of upper extremity: Secondary | ICD-10-CM | POA: Diagnosis not present

## 2020-12-27 DIAGNOSIS — M9902 Segmental and somatic dysfunction of thoracic region: Secondary | ICD-10-CM | POA: Diagnosis not present

## 2020-12-27 DIAGNOSIS — M9907 Segmental and somatic dysfunction of upper extremity: Secondary | ICD-10-CM | POA: Diagnosis not present

## 2020-12-27 DIAGNOSIS — M9901 Segmental and somatic dysfunction of cervical region: Secondary | ICD-10-CM | POA: Diagnosis not present

## 2020-12-27 DIAGNOSIS — M26619 Adhesions and ankylosis of temporomandibular joint, unspecified side: Secondary | ICD-10-CM | POA: Diagnosis not present

## 2021-01-03 DIAGNOSIS — M26619 Adhesions and ankylosis of temporomandibular joint, unspecified side: Secondary | ICD-10-CM | POA: Diagnosis not present

## 2021-01-03 DIAGNOSIS — M9901 Segmental and somatic dysfunction of cervical region: Secondary | ICD-10-CM | POA: Diagnosis not present

## 2021-01-03 DIAGNOSIS — M9907 Segmental and somatic dysfunction of upper extremity: Secondary | ICD-10-CM | POA: Diagnosis not present

## 2021-01-03 DIAGNOSIS — M9902 Segmental and somatic dysfunction of thoracic region: Secondary | ICD-10-CM | POA: Diagnosis not present

## 2021-01-05 DIAGNOSIS — F4311 Post-traumatic stress disorder, acute: Secondary | ICD-10-CM | POA: Diagnosis not present

## 2021-01-10 DIAGNOSIS — M9901 Segmental and somatic dysfunction of cervical region: Secondary | ICD-10-CM | POA: Diagnosis not present

## 2021-01-10 DIAGNOSIS — M9907 Segmental and somatic dysfunction of upper extremity: Secondary | ICD-10-CM | POA: Diagnosis not present

## 2021-01-10 DIAGNOSIS — M26619 Adhesions and ankylosis of temporomandibular joint, unspecified side: Secondary | ICD-10-CM | POA: Diagnosis not present

## 2021-01-10 DIAGNOSIS — M9902 Segmental and somatic dysfunction of thoracic region: Secondary | ICD-10-CM | POA: Diagnosis not present

## 2021-01-17 DIAGNOSIS — M9901 Segmental and somatic dysfunction of cervical region: Secondary | ICD-10-CM | POA: Diagnosis not present

## 2021-01-17 DIAGNOSIS — M9902 Segmental and somatic dysfunction of thoracic region: Secondary | ICD-10-CM | POA: Diagnosis not present

## 2021-01-17 DIAGNOSIS — M26619 Adhesions and ankylosis of temporomandibular joint, unspecified side: Secondary | ICD-10-CM | POA: Diagnosis not present

## 2021-01-17 DIAGNOSIS — M9907 Segmental and somatic dysfunction of upper extremity: Secondary | ICD-10-CM | POA: Diagnosis not present

## 2021-01-19 ENCOUNTER — Other Ambulatory Visit: Payer: Self-pay | Admitting: Family Medicine

## 2021-01-19 DIAGNOSIS — F4311 Post-traumatic stress disorder, acute: Secondary | ICD-10-CM | POA: Diagnosis not present

## 2021-01-19 DIAGNOSIS — B009 Herpesviral infection, unspecified: Secondary | ICD-10-CM

## 2021-01-26 DIAGNOSIS — F4311 Post-traumatic stress disorder, acute: Secondary | ICD-10-CM | POA: Diagnosis not present

## 2021-01-31 DIAGNOSIS — M26619 Adhesions and ankylosis of temporomandibular joint, unspecified side: Secondary | ICD-10-CM | POA: Diagnosis not present

## 2021-01-31 DIAGNOSIS — M9902 Segmental and somatic dysfunction of thoracic region: Secondary | ICD-10-CM | POA: Diagnosis not present

## 2021-01-31 DIAGNOSIS — M9907 Segmental and somatic dysfunction of upper extremity: Secondary | ICD-10-CM | POA: Diagnosis not present

## 2021-01-31 DIAGNOSIS — M9901 Segmental and somatic dysfunction of cervical region: Secondary | ICD-10-CM | POA: Diagnosis not present

## 2021-02-09 DIAGNOSIS — F4311 Post-traumatic stress disorder, acute: Secondary | ICD-10-CM | POA: Diagnosis not present

## 2021-02-13 ENCOUNTER — Other Ambulatory Visit: Payer: Self-pay

## 2021-02-13 ENCOUNTER — Ambulatory Visit: Payer: BC Managed Care – PPO | Admitting: Orthopaedic Surgery

## 2021-02-13 DIAGNOSIS — G8929 Other chronic pain: Secondary | ICD-10-CM | POA: Diagnosis not present

## 2021-02-13 DIAGNOSIS — M25511 Pain in right shoulder: Secondary | ICD-10-CM | POA: Diagnosis not present

## 2021-02-13 NOTE — Progress Notes (Signed)
Office Visit Note   Patient: Elizabeth Contreras           Date of Birth: 27-Jan-1981           MRN: [id number] Visit Date: 02/13/2021              Requested by: Billie Ruddy, MD [place],   [id number] PCP: Billie Ruddy, MD   Assessment & Plan: Visit Diagnoses:  1. Chronic right shoulder pain     Plan: [name] returns today for ongoing right shoulder pain localizes to the lateral deltoid and shoulder with occasional radiation down into the entire arm.  Denies any radicular symptoms..  Denies any numbness and tingling.  She feels pain when she turns over at night.  Overall the pain tends to be fairly quick for maybe a minute.  Denies any pain at rest.  She will have this pain multiple times a day.  Denies any recent injuries or trauma.  Right shoulder shows slight decreased range of motion to pain and guarding.  She has most pain with empty can sign.  No real symptoms from the [facility] joint.  We do have a previous MRI which showed tendinopathy of the supraspinatus without any other structural findings.  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  Based on treatment options we will try an intra-articular shoulder injection and 6 weeks of physical therapy.  She does not feel any improvement she will follow-up with me at that time.  Follow-Up Instructions: No follow-ups on file.   Orders:  Orders Placed This Encounter  Procedures   Ambulatory referral to Physical Therapy   Ambulatory referral to Physical Medicine Rehab   No orders of the defined types were placed in this encounter.     Procedures: No procedures performed   Clinical Data: No additional findings.   Subjective: Chief Complaint  Patient presents with   Right Shoulder - Follow-up    HPI  Review of Systems   Objective: Vital Signs: There were no vitals taken for this visit.  Physical  Exam  Ortho Exam  Specialty Comments:  No specialty comments available.  Imaging: No results found.   PMFS History: Patient Active Problem List   Diagnosis Date Noted   Superior labrum anterior-to-posterior (SLAP) tear of left shoulder 06/04/2019   Anxiety 02/26/2019   PTSD (post-traumatic stress disorder) 02/26/2019   Subclinical hypothyroidism 11/21/2016   Vitamin D deficiency 11/21/2016   Common migraine with intractable migraine 06/20/2016   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    Thyroid disease     Family History  Problem Relation Age of Onset   Migraines Mother    Aneurysm Mother    Cancer Maternal Aunt        ovarian   Cancer Paternal Uncle        colon    Past Surgical History:  Procedure Laterality Date   COLPOSCOPY     NASAL SINUS SURGERY  07/2015   SHOULDER ARTHROSCOPY WITH BICEPS TENDON REPAIR Left 07/21/2019  Procedure: LEFT SHOULDER ARTHROSCOPY WITH BICEPS TENODESIS WITH DEBRIDEMENT OF LABRUM;  Surgeon: Leandrew Koyanagi, MD;  Location: [facility];  Service: Orthopedics;  Laterality: Left;   Social History   Occupational History    Comment: unemployed  Tobacco Use   Smoking status: Never   Smokeless tobacco: Never  Vaping Use   Vaping Use: Never used  Substance and Sexual Activity   Alcohol use: Yes    Comment: rarely   Drug use: No   Sexual activity: Yes    Partners: Male    Birth control/protection: None

## 2021-02-16 DIAGNOSIS — F4311 Post-traumatic stress disorder, acute: Secondary | ICD-10-CM | POA: Diagnosis not present

## 2021-02-23 DIAGNOSIS — F4311 Post-traumatic stress disorder, acute: Secondary | ICD-10-CM | POA: Diagnosis not present

## 2021-02-28 ENCOUNTER — Ambulatory Visit: Payer: Self-pay

## 2021-02-28 ENCOUNTER — Encounter: Payer: Self-pay | Admitting: Physical Medicine and Rehabilitation

## 2021-02-28 ENCOUNTER — Other Ambulatory Visit: Payer: Self-pay

## 2021-02-28 ENCOUNTER — Ambulatory Visit (INDEPENDENT_AMBULATORY_CARE_PROVIDER_SITE_OTHER): Payer: BC Managed Care – PPO | Admitting: Physical Medicine and Rehabilitation

## 2021-02-28 DIAGNOSIS — G8929 Other chronic pain: Secondary | ICD-10-CM | POA: Diagnosis not present

## 2021-02-28 DIAGNOSIS — M25511 Pain in right shoulder: Secondary | ICD-10-CM

## 2021-02-28 MED ORDER — BUPIVACAINE HCL 0.25 % IJ SOLN
5.0000 mL | INTRAMUSCULAR | Status: AC | PRN
  Administered 2021-02-28: 10:00:00 5 mL via INTRA_ARTICULAR

## 2021-02-28 MED ORDER — TRIAMCINOLONE ACETONIDE 40 MG/ML IJ SUSP
40.0000 mg | INTRAMUSCULAR | Status: AC | PRN
  Administered 2021-02-28: 10:00:00 40 mg via INTRA_ARTICULAR

## 2021-02-28 NOTE — Progress Notes (Signed)
° °  Elizabeth Contreras - 41 y.o. female MRN [id number]  Date of birth: May 27, 1980  Office Visit Note: Visit Date: 02/28/2021 PCP: Billie Ruddy, MD Referred by: Billie Ruddy, MD  Subjective: Chief Complaint  Patient presents with   Right Shoulder - Pain   HPI:  Elizabeth Contreras is a 41 y.o. female who comes in today at the request of Dr. Eduard Roux for planned Right anesthetic glenohumeral arthrogram with fluoroscopic guidance.  The patient has failed conservative care including home exercise, medications, time and activity modification.  This injection will be diagnostic and hopefully therapeutic.  Please see requesting physician notes for further details and justification. MRI arthrogram reviewed with images and spine model.  MRI arthrogram reviewed in the note below.   ROS Otherwise per HPI.  Assessment & Plan: Visit Diagnoses:    ICD-10-CM   1. Chronic right shoulder pain  M25.511 XR C-ARM NO REPORT   G89.29       Plan: No additional findings.   Meds & Orders: No orders of the defined types were placed in this encounter.   Orders Placed This Encounter  Procedures   Large Joint Inj   XR C-ARM NO REPORT    Follow-up: Return for visit to requesting provider as needed.   Procedures: Large Joint Inj: R glenohumeral on 02/28/2021 9:47 AM Indications: pain and diagnostic evaluation Details: 22 G 3.5 in needle, fluoroscopy-guided anteromedial approach  Arthrogram: No  Medications: 40 mg triamcinolone acetonide 40 MG/ML; 5 mL bupivacaine 0.25 % Outcome: tolerated well, no immediate complications  There was excellent flow of contrast producing a partial arthrogram of the glenohumeral joint. The patient did have relief of symptoms during the anesthetic phase of the injection. Procedure, treatment alternatives, risks and benefits explained, specific risks discussed. Consent was given by the patient. Immediately prior to procedure a time out was called to verify the correct  patient, procedure, equipment, support staff and site/side marked as required. Patient was prepped and draped in the usual sterile fashion.         Clinical History: MR ARTHROGRAM OF THE RIGHT SHOULDER   TECHNIQUE: Multiplanar, multisequence MR imaging of the right shoulder was performed following the administration of intra-articular contrast.   CONTRAST:  See Injection Documentation.   COMPARISON:  Plain films right shoulder 07/13/2020. Image from contrast injection 08/11/2020.   FINDINGS: Rotator cuff: Mild appearing rotator cuff tendinopathy without tear is seen. Contrast in the subscapularis from injection is noted.   Muscles: Normal without atrophy or focal lesion.   Biceps long head: Intact and normal in appearance.   Acromioclavicular Joint: Appears normal. The acromion is type 1. There is no contrast in the subacromial/subdeltoid bursa but there is a small volume fluid in the bursa.   Glenohumeral Joint: Appears normal.   Labrum: Intact.   Bones: Normal marrow signal throughout.   IMPRESSION: Mild appearing rotator cuff tendinopathy without tear. Contrast in the subscapularis from injection noted.   Intact long head of biceps and labrum.   Small volume of fluid in the subacromial/subdeltoid bursa suggestive of bursitis.     Electronically Signed   By: Inge Rise M.D.   On: 08/13/2020 12:59     Objective:  VS:  HT:     WT:    BMI:      BP:    HR: bpm   TEMP: ( )   RESP:  Physical Exam   Imaging: No results found.

## 2021-02-28 NOTE — Progress Notes (Signed)
Pt state right shoulder pain. Pt state any movement with her right arm makes the pain worse. Pt state she doesn't take anything for the pain.  Numeric Pain Rating Scale and Functional Assessment Average Pain 2   In the last MONTH (on 0-10 scale) has pain interfered with the following?  1. General activity like being  able to carry out your everyday physical activities such as walking, climbing stairs, carrying groceries, or moving a chair?  Rating(7)   -BT, -Dye Allergies.

## 2021-03-02 DIAGNOSIS — F4311 Post-traumatic stress disorder, acute: Secondary | ICD-10-CM | POA: Diagnosis not present

## 2021-03-06 NOTE — Therapy (Signed)
OUTPATIENT PHYSICAL THERAPY SHOULDER EVALUATION   Patient Name: Elizabeth Contreras MRN: [id number] DOB:Oct 25, 1980, 41 y.o., female Today's Date: 03/07/2021   PT End of Session - 03/07/21 5631     Visit Number 1    Number of Visits 8    Date for PT Re-Evaluation 05/02/21    PT Start Time 0934    PT Stop Time 1015    PT Time Calculation (min) 41 min    Activity Tolerance Patient tolerated treatment well    Behavior During Therapy [facility] for tasks assessed/performed             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    Thyroid disease    Past Surgical History:  Procedure Laterality Date   COLPOSCOPY     NASAL SINUS SURGERY  07/2015   SHOULDER ARTHROSCOPY WITH BICEPS TENDON REPAIR Left 07/21/2019   Procedure: LEFT SHOULDER ARTHROSCOPY WITH BICEPS TENODESIS WITH DEBRIDEMENT OF LABRUM;  Surgeon: Leandrew Koyanagi, MD;  Location: [place];  Service: Orthopedics;  Laterality: Left;   Patient Active Problem List   Diagnosis Date Noted   Superior labrum anterior-to-posterior (SLAP) tear of left shoulder 06/04/2019   Anxiety 02/26/2019   PTSD (post-traumatic stress disorder) 02/26/2019   Subclinical hypothyroidism 11/21/2016   Vitamin D deficiency 11/21/2016   Common migraine with intractable migraine 06/20/2016    PCP: Billie Ruddy, MD  REFERRING PROVIDER: Leandrew Koyanagi, MD  REFERRING DIAG: [id number] (ICD-10-CM) - Chronic right shoulder pain   THERAPY DIAG:  Chronic right shoulder pain  Muscle weakness (generalized)  Stiffness of right shoulder, not elsewhere classified  Localized edema   ONSET DATE: > 6 months ago  SUBJECTIVE:                                                                                                                                                                                       SUBJECTIVE STATEMENT: Pt arriving to therapy reporting pain in right shoulder for 6 months. Pt stating injection last week helped. Pt reporting pain can wake her up at night causing shooting pains like when she lifts the covers. Pt stating pain is more with movement.   PERTINENT HISTORY: SLAP repair bicep tenodesis on 07/21/2019 asthma  PAIN:  Are you having pain? Yes NPRS scale: 1/10  Pain location: right lateral shoulder, middle deltoid Pain orientation: Right  PAIN TYPE: aching and sharp Pain description: intermittent  Aggravating factors: sleeping, lifting, reaching upward or across the body Relieving factors: resting, changing positions  PRECAUTIONS: None  WEIGHT BEARING RESTRICTIONS No  FALLS:  Has patient fallen in last 6 months? No Number of falls: 0  LIVING ENVIRONMENT: Lives with: lives with their family and lives with their spouse Lives in: House/apartment Stairs: Yes; External: 1 steps; none Has following equipment at home: None  OCCUPATION: Human resources  PLOF: Independent  PATIENT GOALS : get back ROM, resolve pain  OBJECTIVE:   DIAGNOSTIC FINDINGS:  MRI which showed tendinopathy of the supraspinatus without any other structural findings.   PATIENT SURVEYS:  FOTO 64% (predicted 71%)  COGNITION:  Overall cognitive status: Within functional limits for tasks assessed       POSTURE: Forward head and shoulders  PALPATION: TTP: over middle and anterior deltoid and supraspinatus tendon, mild tightness noted in right upper trap and rhomboids  UPPER EXTREMITY AROM/PROM:  AROM Right 03/07/2021 Left 03/07/2021  Shoulder flexion 130 162  Shoulder extension 55 55  Shoulder abduction 124 170   Shoulder adduction    Shoulder internal rotation 62 90  Shoulder external rotation 38 85  Elbow flexion     Elbow extension    Wrist flexion    Wrist extension    Wrist ulnar deviation    Wrist radial deviation    Wrist pronation    Wrist supination    (Blank rows = not tested) PROM Right 03/07/2021 Left 03/07/2021  Shoulder flexion 135   Shoulder extension 58   Shoulder abduction 130    Shoulder adduction    Shoulder internal rotation 64   Shoulder external rotation 42    Elbow flexion    Elbow extension    Wrist flexion    Wrist extension    Wrist ulnar deviation    Wrist radial deviation    Wrist pronation    Wrist supination    (Blank rows = not tested)  UPPER EXTREMITY MMT:  MMT Right 03/07/2021 Left 03/07/2021  Shoulder flexion 3/5 4+/5  Shoulder extension 4/5 4+/5  Shoulder abduction 3/5 4+/5  Shoulder adduction    Shoulder internal rotation 3/5 4+/5  Shoulder external rotation 3/5 4+/5  Middle trapezius    Lower trapezius    Elbow flexion    Elbow extension    Wrist flexion    Wrist extension    Wrist ulnar deviation    Wrist radial deviation    Wrist pronation    Wrist supination    Grip strength (lbs) 54.6 pounds 50.1 pounds  (Blank rows = not tested)  SHOULDER SPECIAL TESTS:  Impingement tests: Hawkins/Kennedy impingement test: positive     Instability tests: Apprehension test: positive   Rotator cuff assessment: Empty can test: positive     TODAY'S TREATMENT:  03/07/2021 Rt Supine shoulder:  flexion with 1# bar x10 holding 5 seconds Rt Shoulder ER: with 1# bar x 10 holding 5 seconds Sleeper stretch: IR in right sidelying holding 5 seconds Bilateral Scapular retraction: x 5 holding 5 seconds each    PATIENT EDUCATION: Education details: PT POC, HEP DN handout issued Person educated: Patient Education method: Explanation, Demonstration, Tactile cues, Verbal cues, and Handouts Education comprehension: verbalized understanding and returned demonstration   HOME EXERCISE PROGRAM: Access Code: [facility] URL: [email or web address] Date:  03/07/2021 Prepared by: Kearney Hard  Exercises Supine Shoulder Flexion Extension AAROM with Dowel - 2-3  x daily - 7 x weekly - 2 sets - 10 reps - 5 seconds hold Supine Shoulder External Rotation in 45 Degrees Abduction AAROM with Dowel - 2-3 x daily - 7 x weekly - 2 sets - 10 reps - 5 seconds hold Seated Scapular Retraction - 1 x daily - 7 x weekly - 3 sets - 10 reps Sleeper Stretch - 2-3 x daily - 7 x weekly - 10 reps - 5 seconds hold   ASSESSMENT:  CLINICAL IMPRESSION:  Patient is a 41 y.o. female who was seen today for physical therapy evaluation and treatment for chronic right shoulder pain. Pt with limitations with active and passive ROM in her right shoulder compared to her left. Pt also with weakness noted and pain with MMT. Pt was edu in beginning HEP for stretching. Skilled PT needed to address pt's impairments with the below interventions.     Objective impairments include decreased ROM, decreased strength, increased edema, impaired UE functional use, postural dysfunction, and pain. These impairments are limiting patient from community activity and occupation. Personal factors including 1 comorbidity: asthma, PTSD  are also affecting patient's functional outcome. Patient will benefit from skilled PT to address above impairments and improve overall function.  REHAB POTENTIAL: Good  CLINICAL DECISION MAKING: Stable/uncomplicated  EVALUATION COMPLEXITY: Low   GOALS: Goals reviewed with patient? Yes  SHORT TERM GOALS:  STG Name Target Date Goal status  1 Pt will be independent in her initial HEP.  Baseline:  03/28/2021 INITIAL                                 LONG TERM GOALS:   LTG Name Target Date Goal status  1 Pt will be independent in advanced HEP.  Baseline: 05/02/2021 INITIAL  2 Pt will be able to improve FOTO score to >/= 71% Baseline: 64% 05/02/2021 INITIAL  3 Pt will be able to report sleeping improvements by >/ 50% of the time.  Baseline: 05/02/2021  INITIAL  4 Pt will be able to lift 10# object overhead onto shelf with bilateral UE"s with no pain reported.  Baseline: 05/02/2021 INITIAL  5 Pt will improve her right shoulder flexion to >/= 150 degrees for increased functional mobility.  Baseline: 05/02/2021 INITIAL  6 Pt will improve her right shoulder ER to >/= 60 degrees for increased functional mobility.  Baseline: 05/02/2021 INITIAL  7  Baseline:     PLAN: PT FREQUENCY: 1x/ week  PT DURATION: 8 weeks  PLANNED INTERVENTIONS: Therapeutic exercises, Therapeutic activity, Neuro Muscular re-education, Balance training, Gait training, Patient/Family education, Joint mobilization, Dry Needling, Electrical stimulation, Cryotherapy, Moist heat, Taping, and Manual therapy  PLAN FOR NEXT SESSION: shoulder ROM, strengthening, shoulder mobs, assess for DN to right shoulder   Oretha Caprice, PT, MPT 03/07/2021, 12:18 PM

## 2021-03-07 ENCOUNTER — Ambulatory Visit: Payer: BC Managed Care – PPO | Admitting: Physical Therapy

## 2021-03-07 ENCOUNTER — Encounter: Payer: Self-pay | Admitting: Physical Therapy

## 2021-03-07 ENCOUNTER — Other Ambulatory Visit: Payer: Self-pay

## 2021-03-07 DIAGNOSIS — G8929 Other chronic pain: Secondary | ICD-10-CM

## 2021-03-07 DIAGNOSIS — M25611 Stiffness of right shoulder, not elsewhere classified: Secondary | ICD-10-CM | POA: Diagnosis not present

## 2021-03-07 DIAGNOSIS — R600 Localized edema: Secondary | ICD-10-CM

## 2021-03-07 DIAGNOSIS — M25511 Pain in right shoulder: Secondary | ICD-10-CM | POA: Diagnosis not present

## 2021-03-07 DIAGNOSIS — M6281 Muscle weakness (generalized): Secondary | ICD-10-CM

## 2021-03-09 DIAGNOSIS — F4311 Post-traumatic stress disorder, acute: Secondary | ICD-10-CM | POA: Diagnosis not present

## 2021-03-14 ENCOUNTER — Ambulatory Visit: Payer: BC Managed Care – PPO | Admitting: Physical Therapy

## 2021-03-14 ENCOUNTER — Other Ambulatory Visit: Payer: Self-pay

## 2021-03-14 ENCOUNTER — Encounter: Payer: Self-pay | Admitting: Physical Therapy

## 2021-03-14 DIAGNOSIS — G8929 Other chronic pain: Secondary | ICD-10-CM

## 2021-03-14 DIAGNOSIS — M6281 Muscle weakness (generalized): Secondary | ICD-10-CM | POA: Diagnosis not present

## 2021-03-14 DIAGNOSIS — R600 Localized edema: Secondary | ICD-10-CM | POA: Diagnosis not present

## 2021-03-14 DIAGNOSIS — M25611 Stiffness of right shoulder, not elsewhere classified: Secondary | ICD-10-CM

## 2021-03-14 DIAGNOSIS — M25511 Pain in right shoulder: Secondary | ICD-10-CM | POA: Diagnosis not present

## 2021-03-14 NOTE — Therapy (Signed)
OUTPATIENT PHYSICAL THERAPY TREATMENT NOTE   Patient Name: Elizabeth Contreras MRN: [id number] DOB:[birth year], 41 y.o., female Today's Date: 03/14/2021  PCP: Billie Ruddy, MD REFERRING PROVIDER: Leandrew Koyanagi, MD   PT End of Session - 03/14/21 1012     Visit Number 2    Number of Visits 8    Date for PT Re-Evaluation 05/02/21    PT Start Time 0931    PT Stop Time 1010    PT Time Calculation (min) 39 min    Activity Tolerance Patient tolerated treatment well    Behavior During Therapy [facility] for tasks assessed/performed             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    Thyroid disease    Past Surgical History:  Procedure Laterality Date   COLPOSCOPY     NASAL SINUS SURGERY  07/2015   SHOULDER ARTHROSCOPY WITH BICEPS TENDON REPAIR Left 07/21/2019   Procedure: LEFT SHOULDER ARTHROSCOPY WITH BICEPS TENODESIS WITH DEBRIDEMENT OF LABRUM;  Surgeon: Leandrew Koyanagi, MD;  Location: [place];  Service: Orthopedics;  Laterality: Left;   Patient Active Problem List   Diagnosis Date Noted   Superior labrum anterior-to-posterior (SLAP) tear of left shoulder 06/04/2019   Anxiety 02/26/2019   PTSD (post-traumatic stress disorder) 02/26/2019   Subclinical hypothyroidism 11/21/2016   Vitamin D deficiency 11/21/2016   Common migraine with intractable migraine 06/20/2016    REFERRING DIAG: M25.511,G89.29 (ICD-10-CM) - Chronic right shoulder pain   THERAPY DIAG:  Chronic right shoulder pain  Muscle weakness (generalized)  Stiffness of right shoulder, not elsewhere classified  Localized edema  PERTINENT HISTORY: SLAP repair bicep tenodesis on 07/21/2019 asthma     PRECAUTIONS: None  SUBJECTIVE: overall has noticed some improvement in symptoms and motion is better; still tight and having trouble with sleeping and rolling over at night  PAIN:  Are you having pain? Yes NPRS scale: 1/10 Pain location: shoulder Pain orientation: Right  PAIN TYPE: aching, pinching Pain description: intermittent  Aggravating factors: rolloing over at night, end ranges Relieving factors: rest, avoiding provoking movements    OBJECTIVE:    PATIENT SURVEYS:  03/07/21: FOTO 64% (predicted 71%)      UPPER EXTREMITY AROM/PROM:   AROM Right 03/07/2021 Left 03/07/2021 Right 03/14/21  Shoulder flexion 130 162 143  Shoulder extension 55 55   Shoulder abduction 124 170   172  Shoulder adduction       Shoulder internal rotation 62 90   Shoulder external rotation 38 85   Elbow flexion       Elbow extension       Wrist flexion       Wrist extension       Wrist ulnar deviation       Wrist radial deviation       Wrist pronation       Wrist supination       (Blank rows = not tested) PROM Right 03/07/2021 Left 03/07/2021  Shoulder flexion 135    Shoulder extension 58    Shoulder abduction 130      Shoulder adduction  Shoulder internal rotation 64    Shoulder external rotation 42      Elbow flexion      Elbow extension      Wrist flexion      Wrist extension      Wrist ulnar deviation      Wrist radial deviation      Wrist pronation      Wrist supination      (Blank rows = not tested)   UPPER EXTREMITY MMT:   MMT Right 03/07/2021 Left 03/07/2021  Shoulder flexion 3/5 4+/5  Shoulder extension 4/5 4+/5  Shoulder abduction 3/5 4+/5  Shoulder adduction      Shoulder internal rotation 3/5 4+/5  Shoulder external rotation 3/5 4+/5  Middle trapezius      Lower trapezius      Elbow flexion      Elbow extension      Wrist flexion      Wrist extension      Wrist ulnar deviation      Wrist radial deviation      Wrist pronation      Wrist supination       Grip strength (lbs) 54.6 pounds 50.1 pounds  (Blank rows = not tested)   SHOULDER SPECIAL TESTS:           Impingement tests: Hawkins/Kennedy impingement test: positive                       Instability tests: Apprehension test: positive            Rotator cuff assessment: Empty can test: positive               TODAY'S TREATMENT:   03/14/21 Therapeutic Exercise: Aerobic:  UBE L3 x 4 min (2 min each direction) Seated:  Pulleys flexion and scaption x 3 min each Standing:  Wall ladder flexion and scaption with 3 sec stretch x 10 reps each on Rt;  Rt shoulder flexion to 90 deg 2# 3x10, abduction with 2# 3x10 to 90 deg; Rt bicep curl with overhead press 3x10 2#;  IR/ER walkout with L3 band 2x10  Rt shoulder, Rows 3x10 with L3 band,  Rt IR stretch with strap 3x30 sec   03/07/2021 Rt Supine shoulder:  flexion with 1# bar x10 holding 5 seconds Rt Shoulder ER: with 1# bar x 10 holding 5 seconds Sleeper stretch: IR in right sidelying holding 5 seconds Bilateral Scapular retraction: x 5 holding 5 seconds each       PATIENT EDUCATION: Education details: PT POC, HEP DN handout issued Person educated: Patient Education method: Explanation, Demonstration, Tactile cues, Verbal cues, and Handouts Education comprehension: verbalized understanding and returned demonstration     HOME EXERCISE PROGRAM: Access Code: [facility] URL: [email or web address] Date: 03/07/2021 Prepared by: Kearney Hard   Exercises Supine Shoulder Flexion Extension AAROM with Dowel - 2-3 x daily - 7 x weekly - 2 sets - 10 reps - 5 seconds hold Supine Shoulder External Rotation in 45 Degrees Abduction AAROM with Dowel - 2-3 x daily - 7 x weekly - 2 sets - 10 reps - 5 seconds hold Seated Scapular Retraction - 1 x daily - 7 x weekly - 3 sets - 10 reps Sleeper Stretch - 2-3 x daily - 7 x weekly - 10 reps - 5 seconds hold     ASSESSMENT:   CLINICAL IMPRESSION:  Pt has demonstrated good initial  progress with her AROM and is progressing well with PT at  this time.  Plan to progress HEP next visit if motion continues to improve.  Will continue to benefit from PT to maximize function.     Objective impairments include decreased ROM, decreased strength, increased edema, impaired UE functional use, postural dysfunction, and pain. These impairments are limiting patient from community activity and occupation. Personal factors including 1 comorbidity: asthma, PTSD  are also affecting patient's functional outcome. Patient will benefit from skilled PT to address above impairments and improve overall function.   REHAB POTENTIAL: Good   CLINICAL DECISION MAKING: Stable/uncomplicated   EVALUATION COMPLEXITY: Low     GOALS: Goals reviewed with patient? Yes   SHORT TERM GOALS:   STG Name Target Date Goal status  1 Pt will be independent in her initial HEP.  Baseline:  03/28/2021 INITIAL                                                          LONG TERM GOALS:    LTG Name Target Date Goal status  1 Pt will be independent in advanced HEP.  Baseline: 05/02/2021 INITIAL  2 Pt will be able to improve FOTO score to >/= 71% Baseline: 64% 05/02/2021 INITIAL  3 Pt will be able to report sleeping improvements by >/ 50% of the time.  Baseline: 05/02/2021 INITIAL  4 Pt will be able to lift 10# object overhead onto shelf with bilateral UE"s with no pain reported.  Baseline: 05/02/2021 INITIAL  5 Pt will improve her right shoulder flexion to >/= 150 degrees for increased functional mobility.  Baseline: 05/02/2021 INITIAL  6 Pt will improve her right shoulder ER to >/= 60 degrees for increased functional mobility.  Baseline: 05/02/2021 INITIAL  7   Baseline:        PLAN: PT FREQUENCY: 1x/ week   PT DURATION: 8 weeks   PLANNED INTERVENTIONS: Therapeutic exercises, Therapeutic activity, Neuro Muscular re-education, Balance training, Gait training, Patient/Family education, Joint mobilization, Dry  Needling, Electrical stimulation, Cryotherapy, Moist heat, Taping, and Manual therapy   PLAN FOR NEXT SESSION:  Continue end range stretching, strengthening, modalities PRN    Laureen Abrahams, PT, DPT 03/14/21 10:12 AM

## 2021-03-16 DIAGNOSIS — F4311 Post-traumatic stress disorder, acute: Secondary | ICD-10-CM | POA: Diagnosis not present

## 2021-03-19 ENCOUNTER — Encounter: Payer: BC Managed Care – PPO | Admitting: Physical Therapy

## 2021-03-30 ENCOUNTER — Encounter: Payer: Self-pay | Admitting: Physical Therapy

## 2021-03-30 ENCOUNTER — Ambulatory Visit: Payer: BC Managed Care – PPO | Admitting: Physical Therapy

## 2021-03-30 ENCOUNTER — Other Ambulatory Visit: Payer: Self-pay

## 2021-03-30 ENCOUNTER — Encounter: Payer: BC Managed Care – PPO | Admitting: Physical Therapy

## 2021-03-30 DIAGNOSIS — R600 Localized edema: Secondary | ICD-10-CM | POA: Diagnosis not present

## 2021-03-30 DIAGNOSIS — M6281 Muscle weakness (generalized): Secondary | ICD-10-CM

## 2021-03-30 DIAGNOSIS — M25611 Stiffness of right shoulder, not elsewhere classified: Secondary | ICD-10-CM

## 2021-03-30 DIAGNOSIS — G8929 Other chronic pain: Secondary | ICD-10-CM

## 2021-03-30 DIAGNOSIS — M25511 Pain in right shoulder: Secondary | ICD-10-CM | POA: Diagnosis not present

## 2021-03-30 NOTE — Therapy (Signed)
OUTPATIENT PHYSICAL THERAPY TREATMENT NOTE DISCHARGE SUMMARY   Patient Name: Elizabeth Contreras MRN: [id number] DOB:1981-01-22, 41 y.o., female Today's Date: 03/30/2021  PCP: Billie Ruddy, MD REFERRING PROVIDER: Leandrew Koyanagi, MD   PT End of Session - 03/30/21 [phone]     Visit Number 3    Number of Visits 8    Date for PT Re-Evaluation --    PT Start Time 0930    PT Stop Time 0955    PT Time Calculation (min) 25 min    Activity Tolerance Patient tolerated treatment well    Behavior During Therapy [facility] for tasks assessed/performed              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    Thyroid disease    Past Surgical History:  Procedure Laterality Date   COLPOSCOPY     NASAL SINUS SURGERY  07/2015   SHOULDER ARTHROSCOPY WITH BICEPS TENDON REPAIR Left 07/21/2019   Procedure: LEFT SHOULDER ARTHROSCOPY WITH BICEPS TENODESIS WITH DEBRIDEMENT OF LABRUM;  Surgeon: Leandrew Koyanagi, MD;  Location: [facility];  Service: Orthopedics;  Laterality: Left;   Patient Active Problem List   Diagnosis Date Noted   Superior labrum anterior-to-posterior (SLAP) tear of left shoulder 06/04/2019   Anxiety 02/26/2019   PTSD (post-traumatic stress disorder) 02/26/2019   Subclinical hypothyroidism 11/21/2016   Vitamin D deficiency 11/21/2016   Common migraine with intractable migraine 06/20/2016    REFERRING DIAG: M25.511,G89.29 (ICD-10-CM) - Chronic right shoulder pain   THERAPY DIAG:  Chronic right shoulder pain  Muscle weakness (generalized)  Stiffness of right shoulder, not elsewhere classified  Localized edema  PERTINENT HISTORY: SLAP repair bicep tenodesis on  07/21/2019 asthma    PRECAUTIONS: None  SUBJECTIVE: feels much better, shoulder hasn't bothered her much at all over the past few weeks.  PAIN:  Are you having pain? No NPRS scale: 0/10 Pain location: shoulder Pain orientation: Right  PAIN TYPE: aching, pinching Pain description: intermittent  Aggravating factors: rolloing over at night, end ranges Relieving factors: rest, avoiding provoking movements    OBJECTIVE:    PATIENT SURVEYS:  03/07/21: FOTO 64% (predicted 71%) 03/30/21: FOTO 70      UPPER EXTREMITY AROM/PROM:   AROM Right 03/07/2021 Left 03/07/2021 Right 03/14/21 Right 03/30/21  Shoulder flexion 130 162 143 159  Shoulder extension 55 55    Shoulder abduction 124 170   172 180  Shoulder adduction        Shoulder internal rotation 62 90  90 Tightness with FIR (WNL)  Shoulder external rotation 38 85  88  Elbow flexion        Elbow extension        Wrist flexion        Wrist extension        Wrist ulnar deviation        Wrist radial deviation        Wrist pronation        Wrist supination        (Blank rows = not tested) PROM Right 03/07/2021 Left 03/07/2021  Shoulder flexion 135    Shoulder extension 58  Shoulder abduction 130      Shoulder adduction      Shoulder internal rotation 64    Shoulder external rotation 42      Elbow flexion      Elbow extension      Wrist flexion      Wrist extension      Wrist ulnar deviation      Wrist radial deviation      Wrist pronation      Wrist supination      (Blank rows = not tested)   UPPER EXTREMITY MMT:   MMT Right 03/07/2021 Left 03/07/2021 Right 03/30/21  Shoulder flexion 3/5 4+/5 4/5  Shoulder extension 4/5 4+/5   Shoulder abduction 3/5 4+/5 4/5  Shoulder adduction       Shoulder internal rotation 3/5 4+/5 5/5  Shoulder external rotation 3/5 4+/5 4/5  Middle trapezius       Lower trapezius       Elbow flexion       Elbow extension       Wrist flexion       Wrist extension       Wrist ulnar  deviation       Wrist radial deviation       Wrist pronation       Wrist supination       Grip strength (lbs) 54.6 pounds 50.1 pounds   (Blank rows = not tested)   SHOULDER SPECIAL TESTS:           Impingement tests: Hawkins/Kennedy impingement test: positive                       Instability tests: Apprehension test: positive            Rotator cuff assessment: Empty can test: positive               TODAY'S TREATMENT:   03/30/21 Therex: Rt shoulder flexion to 90 deg 2# 3x10,  Abduction with 2# 3x10 to 90 deg;  Rt bicep curl with overhead press 3x10 2# Bent over fly 2# 3x10  03/14/21 Therapeutic Exercise: Aerobic:  UBE L3 x 4 min (2 min each direction) Seated:  Pulleys flexion and scaption x 3 min each Standing:  Wall ladder flexion and scaption with 3 sec stretch x 10 reps each on Rt;  Rt shoulder flexion to 90 deg 2# 3x10, abduction with 2# 3x10 to 90 deg; Rt bicep curl with overhead press 3x10 2#;  IR/ER walkout with L3 band 2x10  Rt shoulder, Rows 3x10 with L3 band,  Rt IR stretch with strap 3x30 sec   03/07/2021 Rt Supine shoulder:  flexion with 1# bar x10 holding 5 seconds Rt Shoulder ER: with 1# bar x 10 holding 5 seconds Sleeper stretch: IR in right sidelying holding 5 seconds Bilateral Scapular retraction: x 5 holding 5 seconds each       PATIENT EDUCATION: Education details: PT POC, HEP DN handout issued Person educated: Patient Education method: Explanation, Demonstration, Tactile cues, Verbal cues, and Handouts Education comprehension: verbalized understanding and returned demonstration     HOME EXERCISE PROGRAM: Access Code: [facility] URL: [email or web address] Date: 03/30/2021 Prepared by: Faustino Congress  Exercises Standing Shoulder Flexion to 90 Degrees with Dumbbells - 1 x daily - 7 x weekly - 3 sets - 10 reps Shoulder Abduction with Dumbbells - Thumbs Up - 1 x daily - 7 x weekly - 3 sets - 10 reps Standing Shoulder External  Rotation with  Dumbbell - 1 x daily - 7 x weekly - 3 sets - 10 reps Shoulder Overhead Press in Flexion with Dumbbells - 1 x daily - 7 x weekly - 3 sets - 10 reps Forward T with Fly - 1 x daily - 7 x weekly - 3 sets - 10 reps    ASSESSMENT:   CLINICAL IMPRESSION:  Pt has met all goals, except FOTO score missed by 1%.  She feels pleased with progress and is ready to transition to HEP.  Will d/c PT today.    Objective impairments include decreased ROM, decreased strength, increased edema, impaired UE functional use, postural dysfunction, and pain. These impairments are limiting patient from community activity and occupation. Personal factors including 1 comorbidity: asthma, PTSD  are also affecting patient's functional outcome. Patient will benefit from skilled PT to address above impairments and improve overall function.   REHAB POTENTIAL: Good   CLINICAL DECISION MAKING: Stable/uncomplicated   EVALUATION COMPLEXITY: Low     GOALS: Goals reviewed with patient? Yes   SHORT TERM GOALS:   STG Name Target Date Goal status  1 Pt will be independent in her initial HEP.  Baseline:  03/28/2021 MET                                                          LONG TERM GOALS:    LTG Name Target Date Goal status  1 Pt will be independent in advanced HEP.  Baseline: 05/02/2021 MET  2 Pt will be able to improve FOTO score to >/= 71% Baseline: 64% 05/02/2021 Partially MET  3 Pt will be able to report sleeping improvements by >/ 50% of the time.  Baseline: 05/02/2021 MET  4 Pt will be able to lift 10# object overhead onto shelf with bilateral UE"s with no pain reported.  Baseline: 05/02/2021 MET  5 Pt will improve her right shoulder flexion to >/= 150 degrees for increased functional mobility.  Baseline: 05/02/2021 MET  6 Pt will improve her right shoulder ER to >/= 60 degrees for increased functional mobility.  Baseline: 05/02/2021 MET    PLAN: PT FREQUENCY: 1x/ week   PT DURATION: 8  weeks   PLANNED INTERVENTIONS: Therapeutic exercises, Therapeutic activity, Neuro Muscular re-education, Balance training, Gait training, Patient/Family education, Joint mobilization, Dry Needling, Electrical stimulation, Cryotherapy, Moist heat, Taping, and Manual therapy   PLAN FOR NEXT SESSION:  D/c PT today    Laureen Abrahams, PT, DPT 03/30/21 9:59 AM        PHYSICAL THERAPY DISCHARGE SUMMARY  Visits from Start of Care: 3  Current functional level related to goals / functional outcomes: See above   Remaining deficits: See above   Education / Equipment: HEP   Patient agrees to discharge. Patient goals were met. Patient is being discharged due to meeting the stated rehab goals.   Laureen Abrahams, PT, DPT 03/30/21 9:59 AM  [facility] [address] [place], [place], [address] Phone: [phone]   Fax:  [phone]

## 2021-04-04 ENCOUNTER — Encounter: Payer: BC Managed Care – PPO | Admitting: Physical Therapy

## 2021-04-06 DIAGNOSIS — F4311 Post-traumatic stress disorder, acute: Secondary | ICD-10-CM | POA: Diagnosis not present

## 2021-04-17 DIAGNOSIS — M9902 Segmental and somatic dysfunction of thoracic region: Secondary | ICD-10-CM | POA: Diagnosis not present

## 2021-04-17 DIAGNOSIS — M9901 Segmental and somatic dysfunction of cervical region: Secondary | ICD-10-CM | POA: Diagnosis not present

## 2021-04-17 DIAGNOSIS — M26619 Adhesions and ankylosis of temporomandibular joint, unspecified side: Secondary | ICD-10-CM | POA: Diagnosis not present

## 2021-04-20 DIAGNOSIS — F4311 Post-traumatic stress disorder, acute: Secondary | ICD-10-CM | POA: Diagnosis not present

## 2021-04-27 DIAGNOSIS — F4311 Post-traumatic stress disorder, acute: Secondary | ICD-10-CM | POA: Diagnosis not present

## 2021-05-04 DIAGNOSIS — F4311 Post-traumatic stress disorder, acute: Secondary | ICD-10-CM | POA: Diagnosis not present

## 2021-05-09 DIAGNOSIS — M9901 Segmental and somatic dysfunction of cervical region: Secondary | ICD-10-CM | POA: Diagnosis not present

## 2021-05-09 DIAGNOSIS — M9902 Segmental and somatic dysfunction of thoracic region: Secondary | ICD-10-CM | POA: Diagnosis not present

## 2021-05-09 DIAGNOSIS — M26619 Adhesions and ankylosis of temporomandibular joint, unspecified side: Secondary | ICD-10-CM | POA: Diagnosis not present

## 2021-05-11 ENCOUNTER — Other Ambulatory Visit: Payer: Self-pay | Admitting: Family Medicine

## 2021-05-11 DIAGNOSIS — F4311 Post-traumatic stress disorder, acute: Secondary | ICD-10-CM | POA: Diagnosis not present

## 2021-05-11 DIAGNOSIS — B009 Herpesviral infection, unspecified: Secondary | ICD-10-CM

## 2021-05-21 ENCOUNTER — Ambulatory Visit: Payer: BC Managed Care – PPO | Admitting: Family Medicine

## 2021-05-21 VITALS — BP 114/71 | HR 72 | Temp 98.4°F

## 2021-05-21 DIAGNOSIS — L509 Urticaria, unspecified: Secondary | ICD-10-CM

## 2021-05-21 NOTE — Progress Notes (Signed)
Subjective:  ? ? Patient ID: Elizabeth Contreras, female    DOB: 09/12/1980, 41 y.o.   MRN: [id number] ? ?Chief Complaint  ?Patient presents with  ? Urticaria  ?  Started Friday night. Took benadryl at night, and hydrocortisone cream during the day, neither has helped. Started a new supplement on Friday morning, also had panic atttack on Friday.   ? ? ?HPI ?Patient was seen today for acute concern.  Pt with hives.  Noted on left neck Friday evening, then noticed: Bilateral feet and LEs.  Areas are mildly pruritic. Tried bendaryl last night which did not seem to help.  Also tried OTC cortisone cream.  Taking Zyrtec for allergies daily.  That Friday pt tried OTC supplement NAC as she heard it may help with PCOS.  The capsule is vegetarian and does not contain dye.  Stopped the supplement.  Had a panic attack Friday and endorses mild increase in stress.  Pt also notes eating a biscuit with ham at a funeral which is something she typically does not eat.  Patient has had flour in the past without issues.  Patient denies changes in soaps, lotions, detergents.  Patient has 2 pets at home which have not had any symptoms.  Patient is also unsure if she was starting to get sick as she felt fatigued and nauseous today. ? ? ?Past Medical History:  ?Diagnosis Date  ? Asthma   ? Common migraine with intractable migraine 06/20/2016  ? Herpes   ? Migraine without aura   ? PCOS (polycystic ovarian syndrome)   ? PONV (postoperative nausea and vomiting)   ? PTSD (post-traumatic stress disorder) 2015  ? Sinusitis   ? STD (sexually transmitted disease)   ? HSV 1 genital   ? Thyroid disease   ? ? ?Allergies  ?Allergen Reactions  ? Topamax [Topiramate]   ?  Cognitive slowing  ? ? ?ROS ?General: Denies fever, chills, night sweats, changes in weight, changes in appetite + fatigue ?HEENT: Denies headaches, ear pain, changes in vision, rhinorrhea, sore throat ?CV: Denies CP, palpitations, SOB, orthopnea ?Pulm: Denies SOB, cough, wheezing ?GI:  Denies abdominal pain, vomiting, diarrhea, constipation +n ?GU: Denies dysuria, hematuria, frequency, vaginal discharge ?Msk: Denies muscle cramps, joint pains ?Neuro: Denies weakness, numbness, tingling ?Skin: Denies rashes, bruising + pruritic lesions ?Psych: Denies depression, anxiety, hallucinations ? ?   ?Objective:  ?  ?Blood pressure 114/71, pulse 72, temperature 98.4 ?F (36.9 ?C), temperature source Oral, SpO2 99 %. ? ?Gen. Pleasant, well-nourished, in no distress, normal affect   ?HEENT: [place]/AT, face symmetric, conjunctiva clear, no scleral icterus, PERRLA, EOMI, nares patent without drainage ?Lungs: no accessory muscle use ?Cardiovascular: RRR, no peripheral edema ?Neuro:  A&Ox3, CN II-XII intact, normal gait ?Skin:  Warm, dry, intact.  Several erythematous slightly raised circular lesions 1-1.5 cm on bilateral feet, left posterior calf, and left UE without drainage or punctum ? ? ?Wt Readings from Last 3 Encounters:  ?03/29/20 133 lb (60.3 kg)  ?11/19/19 133 lb 6.4 oz (60.5 kg)  ?11/08/19 130 lb (59 kg)  ? ? ?Lab Results  ?Component Value Date  ? WBC 7.3 11/19/2019  ? HGB 12.5 11/19/2019  ? HCT 37.4 11/19/2019  ? PLT 178 11/19/2019  ? GLUCOSE 72 11/19/2019  ? CHOL 183 11/19/2019  ? TRIG 52 11/19/2019  ? HDL 65 11/19/2019  ? LDLCALC 105 (H) 11/19/2019  ? ALT 15 11/19/2019  ? AST 19 11/19/2019  ? NA 139 11/19/2019  ? K 4.0 11/19/2019  ?  CL 103 11/19/2019  ? CREATININE 0.54 11/19/2019  ? BUN 8 11/19/2019  ? CO2 25 11/19/2019  ? TSH 2.59 11/19/2019  ? HGBA1C 5.0 11/19/2019  ? ? ?Assessment/Plan: ? ?Urticaria ?Discussed possible causes including starting NAC supplement, new food, or panic attack.  Expectant management including OTC antihistamine, topical antihistamine or cortisone cream, and increasing fluid intake.  Consider changing antihistamine if no relief.  Discussed ways to reduce stress.  Discussed likely duration of symptoms.  Given strict precautions for continued or worsening symptoms. ? ?F/u as  needed ? ?Grier Mitts, MD ?

## 2021-05-25 DIAGNOSIS — F4311 Post-traumatic stress disorder, acute: Secondary | ICD-10-CM | POA: Diagnosis not present

## 2021-05-30 DIAGNOSIS — M9901 Segmental and somatic dysfunction of cervical region: Secondary | ICD-10-CM | POA: Diagnosis not present

## 2021-05-30 DIAGNOSIS — M9902 Segmental and somatic dysfunction of thoracic region: Secondary | ICD-10-CM | POA: Diagnosis not present

## 2021-05-30 DIAGNOSIS — M26619 Adhesions and ankylosis of temporomandibular joint, unspecified side: Secondary | ICD-10-CM | POA: Diagnosis not present

## 2021-06-08 DIAGNOSIS — F4311 Post-traumatic stress disorder, acute: Secondary | ICD-10-CM | POA: Diagnosis not present

## 2021-06-15 DIAGNOSIS — F4311 Post-traumatic stress disorder, acute: Secondary | ICD-10-CM | POA: Diagnosis not present

## 2021-06-21 DIAGNOSIS — M9902 Segmental and somatic dysfunction of thoracic region: Secondary | ICD-10-CM | POA: Diagnosis not present

## 2021-06-21 DIAGNOSIS — M9901 Segmental and somatic dysfunction of cervical region: Secondary | ICD-10-CM | POA: Diagnosis not present

## 2021-06-21 DIAGNOSIS — M26619 Adhesions and ankylosis of temporomandibular joint, unspecified side: Secondary | ICD-10-CM | POA: Diagnosis not present

## 2021-06-28 DIAGNOSIS — F4311 Post-traumatic stress disorder, acute: Secondary | ICD-10-CM | POA: Diagnosis not present

## 2021-07-06 DIAGNOSIS — F4311 Post-traumatic stress disorder, acute: Secondary | ICD-10-CM | POA: Diagnosis not present

## 2021-07-20 DIAGNOSIS — F4311 Post-traumatic stress disorder, acute: Secondary | ICD-10-CM | POA: Diagnosis not present

## 2021-07-26 DIAGNOSIS — F4311 Post-traumatic stress disorder, acute: Secondary | ICD-10-CM | POA: Diagnosis not present

## 2021-08-03 DIAGNOSIS — F4311 Post-traumatic stress disorder, acute: Secondary | ICD-10-CM | POA: Diagnosis not present

## 2021-08-04 IMAGING — MR MR SHOULDER*R* W/CM
6 series · 40 of 40 positions shown · IV contrast (agent unspecified)
Comparison: Plain films right shoulder 07/13/2020. Image from
contrast injection 08/11/2020.

CLINICAL DATA: Right shoulder pain and limited range of motion for
3-4 months. No known injury.

EXAM:
MR ARTHROGRAM OF THE RIGHT SHOULDER
TECHNIQUE: Multiplanar, multisequence MR imaging of the right shoulder was
performed following the administration of intra-articular contrast.
CONTRAST:  See Injection Documentation.

[Series 3: T1 fat-sat · axial · 4.0mm · 0.27mm/px · z∈[-33,+50]mm · 6 of 18 slices shown (1 of 4)]
[im 1/18]
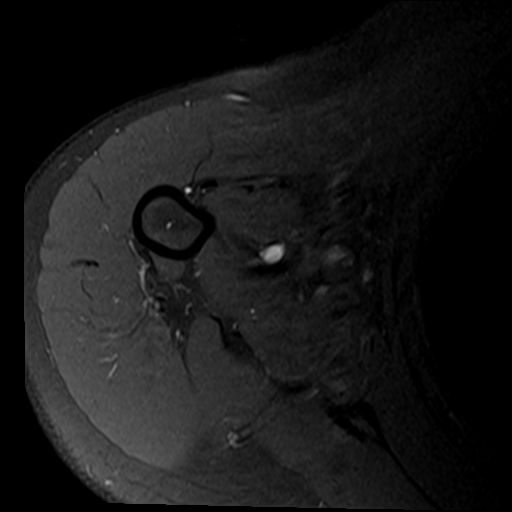
[im 4/18]
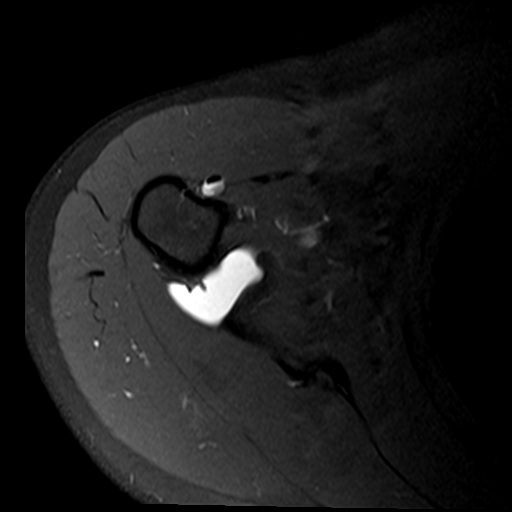
[im 7/18]
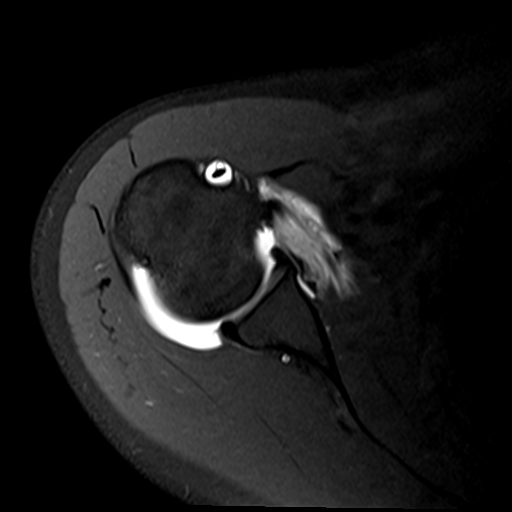
[im 11/18]
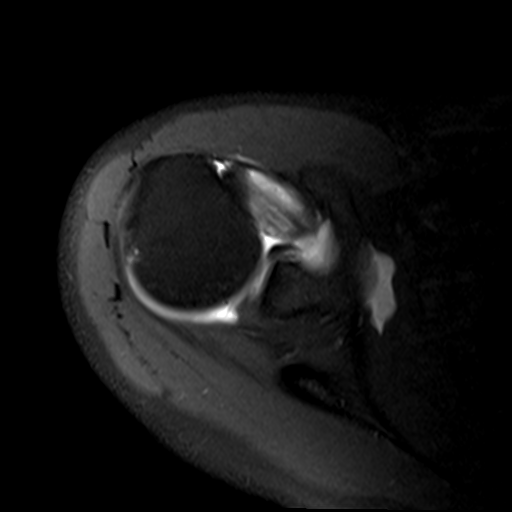
[im 14/18]
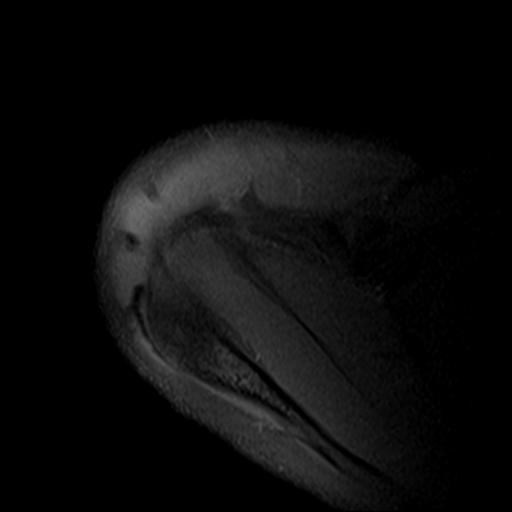
[im 18/18]
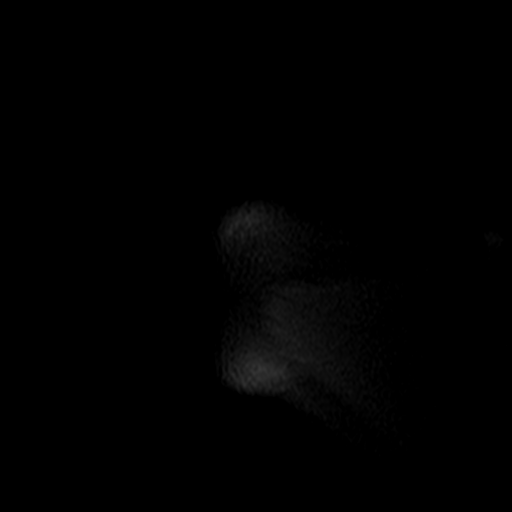

[Series 4: T2 fat-sat · oblique · 4.0mm · 0.55mm/px · 7 of 18 slices shown (1 of 2)]
[im 1/18]
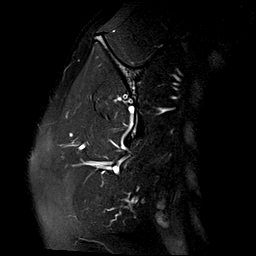
[im 3/18]
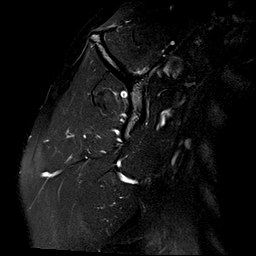
[im 6/18]
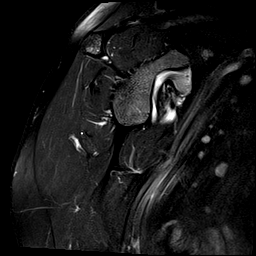
[im 9/18]
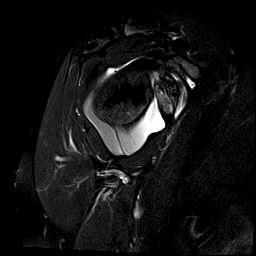
[im 12/18]
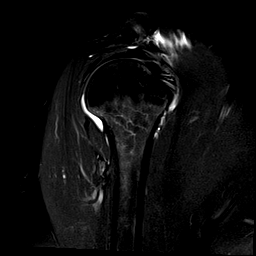
[im 15/18]
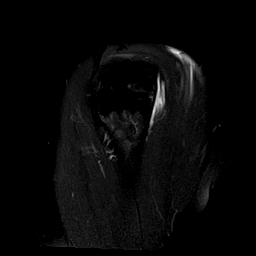
[im 18/18]
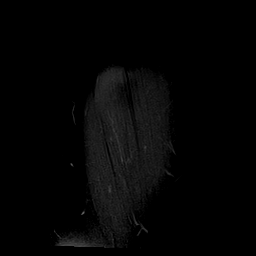

[Series 7: T1 fat-sat · oblique · 4.0mm · 0.55mm/px · 7 of 18 slices shown (2 of 4)]
[im 1/18]
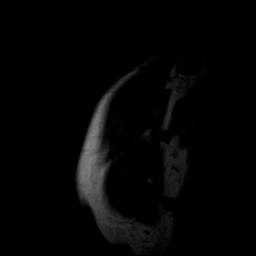
[im 3/18]
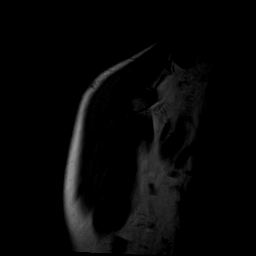
[im 6/18]
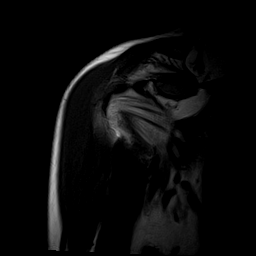
[im 9/18]
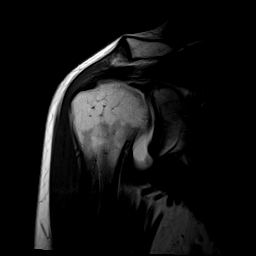
[im 12/18]
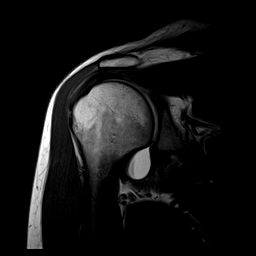
[im 15/18]
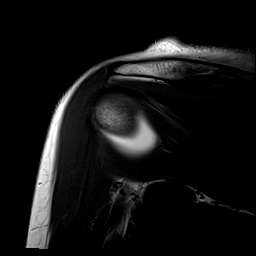
[im 18/18]
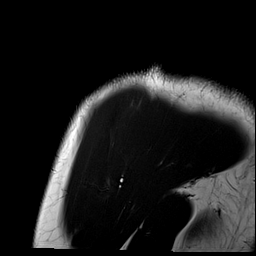

[Series 8: T1 fat-sat · oblique · 4.0mm · 0.55mm/px · 7 of 18 slices shown (3 of 4)]
[im 1/18]
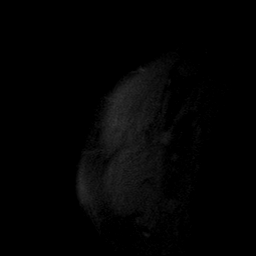
[im 3/18]
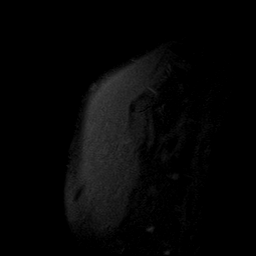
[im 6/18]
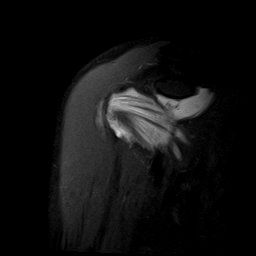
[im 9/18]
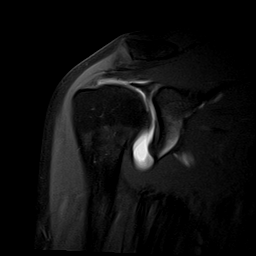
[im 12/18]
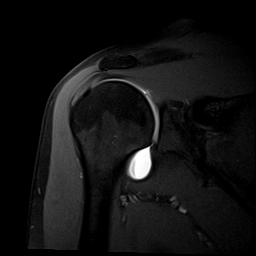
[im 15/18]
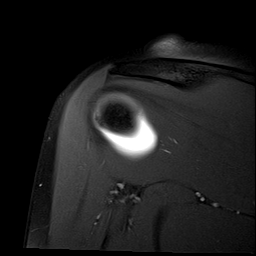
[im 18/18]
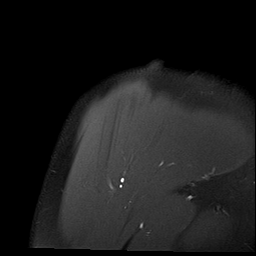

[Series 9: T2 fat-sat · oblique · 4.0mm · 0.55mm/px · 7 of 18 slices shown (2 of 2)]
[im 1/18]
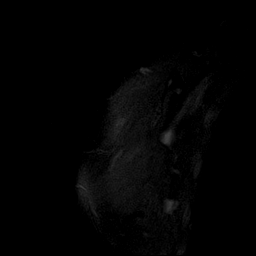
[im 3/18]
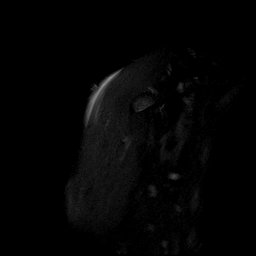
[im 6/18]
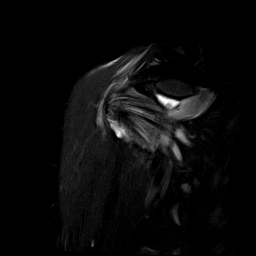
[im 9/18]
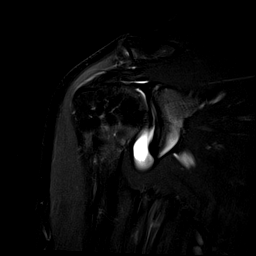
[im 12/18]
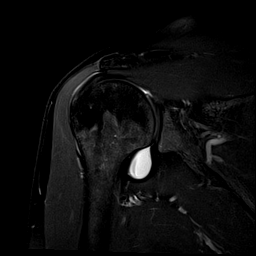
[im 15/18]
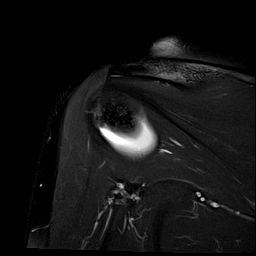
[im 18/18]
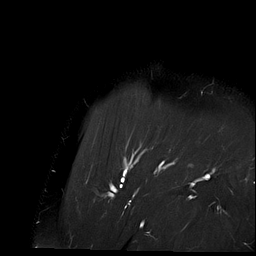

[Series 12: T1 fat-sat · sagittal · 4.0mm · 0.59mm/px · 6 of 16 slices shown (4 of 4)]
[im 1/16]
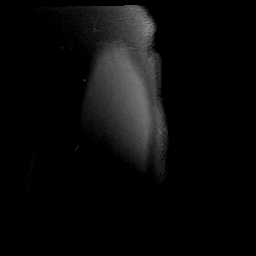
[im 4/16]
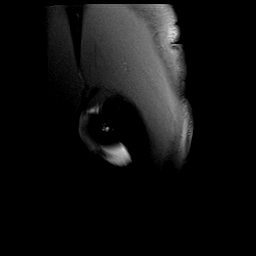
[im 7/16]
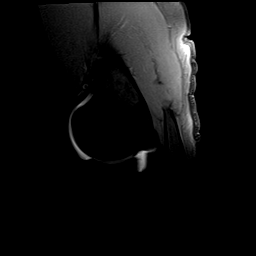
[im 10/16]
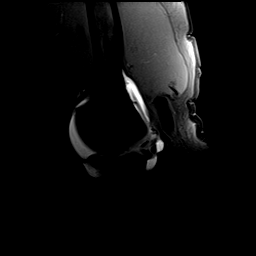
[im 13/16]
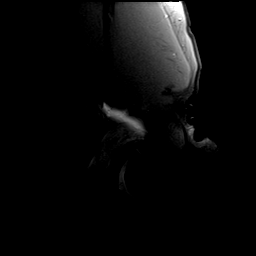
[im 16/16]
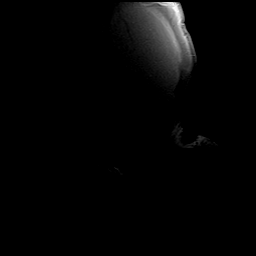

[40 of 40 positions shown; findings below may reference images not displayed]

FINDINGS: Rotator cuff: Mild appearing rotator cuff tendinopathy without tear
is seen. Contrast in the subscapularis from injection is noted.

Muscles: Normal without atrophy or focal lesion.

Biceps long head: Intact and normal in appearance.

Acromioclavicular Joint: Appears normal. The acromion is type 1.
There is no contrast in the subacromial/subdeltoid bursa but there
is a small volume fluid in the bursa.

Glenohumeral Joint: Appears normal.

Labrum: Intact.

Bones: Normal marrow signal throughout.
IMPRESSION: Mild appearing rotator cuff tendinopathy without tear. Contrast in
the subscapularis from injection noted.

Intact long head of biceps and labrum.

Small volume of fluid in the subacromial/subdeltoid bursa suggestive
of bursitis.

## 2021-08-04 IMAGING — XA DG FLUORO GUIDE NDL PLC/BX
1 series · 1 of 1 positions shown · non-contrast
Comparison: none

CLINICAL DATA: Chronic right shoulder pain.

[Series 1: ortho adipose · 1 of 1 slices shown]
[im 1/1]
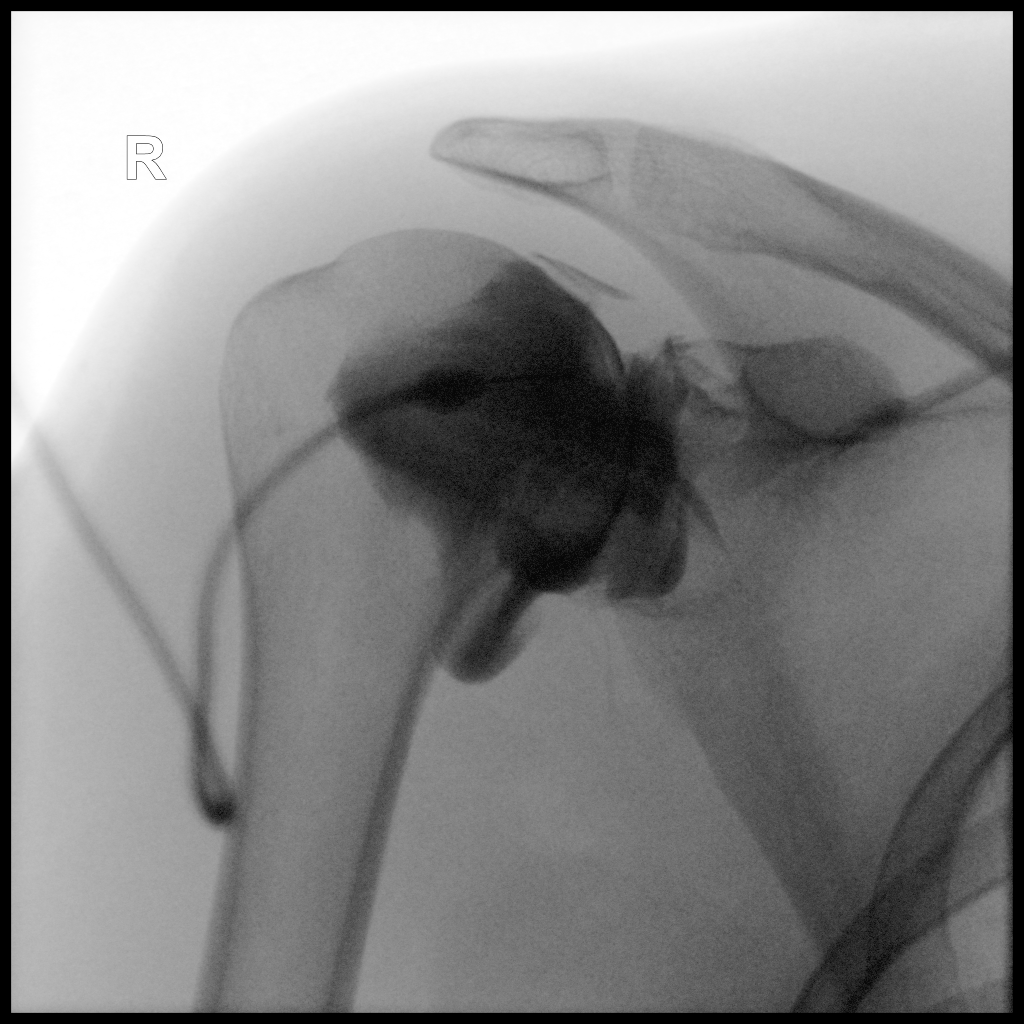

[1 of 1 positions shown; findings below may reference images not displayed]

FLUOROSCOPY TIME:  Radiation Exposure Index (as provided by the
fluoroscopic device): 0.4 mGy

Fluoroscopy Time:  23 seconds

Number of Acquired Images:  0

PROCEDURE:
The risks and benefits of the procedure were discussed with the
patient, and written informed consent was obtained. The patient
stated no history of allergy to contrast media. A formal timeout
procedure was performed with the patient according to departmental
protocol.

The patient was placed supine on the fluoroscopy table and the right
glenohumeral joint was identified under fluoroscopy. The skin
overlying the right glenohumeral joint was subsequently cleaned with
Betadine and a sterile drape was placed over the area of interest. 2
ml 1% Lidocaine was used to anesthetize the skin around the needle
insertion site.

A 22 gauge spinal needle was inserted into the right glenohumeral
joint under fluoroscopy.

12 ml of gadolinium mixture (0.1 ml of Multihance mixed with 15 ml
of Isovue-M 200 contrast and 5 ml of sterile saline) were injected
into the right glenohumeral joint.

The needle was removed and hemostasis was achieved. The patient was
subsequently transferred to MRI for imaging.
IMPRESSION: Technically successful right shoulder injection for MRI.

## 2021-08-09 DIAGNOSIS — M26619 Adhesions and ankylosis of temporomandibular joint, unspecified side: Secondary | ICD-10-CM | POA: Diagnosis not present

## 2021-08-09 DIAGNOSIS — M9902 Segmental and somatic dysfunction of thoracic region: Secondary | ICD-10-CM | POA: Diagnosis not present

## 2021-08-09 DIAGNOSIS — M9901 Segmental and somatic dysfunction of cervical region: Secondary | ICD-10-CM | POA: Diagnosis not present

## 2021-08-16 DIAGNOSIS — F4311 Post-traumatic stress disorder, acute: Secondary | ICD-10-CM | POA: Diagnosis not present

## 2021-08-17 ENCOUNTER — Other Ambulatory Visit: Payer: Self-pay | Admitting: Family Medicine

## 2021-08-17 DIAGNOSIS — B009 Herpesviral infection, unspecified: Secondary | ICD-10-CM

## 2021-08-20 ENCOUNTER — Ambulatory Visit: Payer: BC Managed Care – PPO | Admitting: Obstetrics and Gynecology

## 2021-08-20 ENCOUNTER — Encounter: Payer: Self-pay | Admitting: Obstetrics and Gynecology

## 2021-08-20 VITALS — BP 110/62 | HR 72 | Ht 66.0 in | Wt 147.0 lb

## 2021-08-20 DIAGNOSIS — N946 Dysmenorrhea, unspecified: Secondary | ICD-10-CM | POA: Diagnosis not present

## 2021-08-20 DIAGNOSIS — R194 Change in bowel habit: Secondary | ICD-10-CM | POA: Diagnosis not present

## 2021-08-20 DIAGNOSIS — R110 Nausea: Secondary | ICD-10-CM

## 2021-08-20 MED ORDER — ONDANSETRON HCL 4 MG PO TABS: 4.0000 mg | ORAL_TABLET | Freq: Three times a day (TID) | ORAL | 0 refills | Status: DC | PRN

## 2021-08-20 NOTE — Progress Notes (Signed)
GYNECOLOGY  VISIT   HPI: 41 y.o.   Married White or Caucasian Not Hispanic or Latino  female   [id number] with Patient's last menstrual period was 08/10/2021 (approximate).   here because she had a really bad period onset on 08/10/21. She states that she had bad diarrhea and menstrual cramping as well as vomiting. She states that this has never happened before.   Baseline cycles are ~ monthly x 3-4 days. She can saturate a pad in 4-5 hours, typically she just wears menstrual panties (can wear one all day). Cramps vary from mild to moderate. Sometimes takes OTC ibuprofen which helps. Recently cycles every 30-32 days. LMP came on time. Started heavier and faster than normal, typically spots and cramps prior to her cycle fully starting. This cycle started heavy and more severe cramping, the bowel effects were worse than normal. She was having loose stools with bowel cramps. She had an episode of emesis, not typical for her. No dietary changes to correlate with the bowel changes.  She has started some supplements, vitex (chase tree berry) and another one.     GYNECOLOGIC HISTORY: Patient's last menstrual period was 08/10/2021 (approximate). Contraception:none  Menopausal hormone therapy: none         OB History     Gravida  0   Para  0   Term  0   Preterm  0   AB  0   Living  0      SAB  0   IAB  0   Ectopic  0   Multiple  0   Live Births  0              Patient Active Problem List   Diagnosis Date Noted   Superior labrum anterior-to-posterior (SLAP) tear of left shoulder 06/04/2019   Anxiety 02/26/2019   PTSD (post-traumatic stress disorder) 02/26/2019   Subclinical hypothyroidism 11/21/2016   Vitamin D deficiency 11/21/2016   Common migraine with intractable migraine 06/20/2016    Past Medical History:  Diagnosis Date   Asthma    Common migraine with intractable migraine 06/20/2016   Herpes    Migraine without aura    PCOS (polycystic ovarian syndrome)     PONV (postoperative nausea and vomiting)    PTSD (post-traumatic stress disorder) 2015   Sinusitis    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    Thyroid disease     Past Surgical History:  Procedure Laterality Date   COLPOSCOPY     NASAL SINUS SURGERY  07/2015   SHOULDER ARTHROSCOPY WITH BICEPS TENDON REPAIR Left 07/21/2019   Procedure: LEFT SHOULDER ARTHROSCOPY WITH BICEPS TENODESIS WITH DEBRIDEMENT OF LABRUM;  Surgeon: Leandrew Koyanagi, MD;  Location: [facility];  Service: Orthopedics;  Laterality: Left;    Current Outpatient Medications  Medication Sig Dispense Refill   acetaminophen (TYLENOL) 325 MG tablet Take 650 mg by mouth every 6 (six) hours as needed.     ALPRAZolam (XANAX) 0.25 MG tablet Take 1 tablet (0.25 mg total) by mouth 2 (two) times daily as needed for anxiety. 20 tablet 0   cetirizine (ZYRTEC) 10 MG tablet Take 1 tablet (10 mg total) by mouth daily. 30 tablet 11   Cholecalciferol (VITAMIN D) 2000 units CAPS Take by mouth.     cyclobenzaprine (FLEXERIL) 5 MG tablet TAKE 1 TABLET BY MOUTH THREE TIMES A DAY AS NEEDED FOR MUSCLE SPASMS 30 tablet 1   ibuprofen (ADVIL,MOTRIN) 200 MG tablet Take  200 mg by mouth every 6 (six) hours as needed.     PROAIR HFA 108 (90 Base) MCG/ACT inhaler INHALE 2 PUFFS EVERY 6 HOURS AS NEEDED FOR WHEEZE OR SHORTNESS OF BREATH 8.5 Inhaler 1   Rimegepant Sulfate (NURTEC) 75 MG TBDP Take by mouth.     valACYclovir (VALTREX) 500 MG tablet TAKE 1 TABLET BY MOUTH EVERY DAY 90 tablet 0   No current facility-administered medications for this visit.     ALLERGIES: Topamax [topiramate]  Family History  Problem Relation Age of Onset   Migraines Mother    Aneurysm Mother    Cancer Maternal Aunt        ovarian   Cancer Paternal Uncle        colon    Social History   Socioeconomic History   Marital status: Married    Spouse name: Not on file   Number of children: 0   Years of education: 16   Highest education  level: Bachelor's degree (e.g., BA, AB, BS)  Occupational History    Comment: unemployed  Tobacco Use   Smoking status: Never   Smokeless tobacco: Never  Vaping Use   Vaping Use: Never used  Substance and Sexual Activity   Alcohol use: Yes    Comment: rarely   Drug use: No   Sexual activity: Yes    Partners: Male    Birth control/protection: None  Other Topics Concern   Not on file  Social History Narrative   Lives with husband   Caffeine -tea 1-2 daily   Social Determinants of Health   Financial Resource Strain: Low Risk  (05/21/2021)   Overall Financial Resource Strain (CARDIA)    Difficulty of Paying Living Expenses: Not hard at all  Food Insecurity: No Food Insecurity (05/21/2021)   Hunger Vital Sign    Worried About Running Out of Food in the Last Year: Never true    Ran Out of Food in the Last Year: Never true  Transportation Needs: No Transportation Needs (05/21/2021)   PRAPARE - Hydrologist (Medical): No    Lack of Transportation (Non-Medical): No  Physical Activity: Sufficiently Active (05/21/2021)   Exercise Vital Sign    Days of Exercise per Week: 6 days    Minutes of Exercise per Session: 30 min  Stress: Stress Concern Present (05/21/2021)   [place]    Feeling of Stress : To some extent  Social Connections: Socially Isolated (05/21/2021)   Social Connection and Isolation Panel [NHANES]    Frequency of Communication with Friends and Family: Once a week    Frequency of Social Gatherings with Friends and Family: Once a week    Attends Religious Services: Never    Marine scientist or Organizations: No    Attends Music therapist: Not on file    Marital Status: Married  Human resources officer Violence: Not on file    Review of Systems  All other systems reviewed and are negative.   PHYSICAL EXAMINATION:    BP 110/62   Pulse 72   Ht '5\' 6"'$  (1.676 m)    Wt 147 lb (66.7 kg)   LMP 08/10/2021 (Approximate)   SpO2 99%   BMI 23.73 kg/m     General appearance: alert, cooperative and appears stated age Abdomen: soft, non-tender; non distended, no masses,  no organomegaly  Pelvic: External genitalia:  no lesions  Urethra:  normal appearing urethra with no masses, tenderness or lesions              Bartholins and Skenes: normal                 Cervix: no cervical motion tenderness              Bimanual Exam:  Uterus:  normal size, contour, position, consistency, mobility, non-tender and retroflexed              Adnexa: no mass, fullness, tenderness               Chaperone was present for exam.  1. Dysmenorrhea Typically tolerable, very bad with her last cycle If it happens again will do an ultrasound She will use OTC ibuprofen as needed  2. Nausea Concerned about having nausea with her next cycle. She will be traveling.  - ondansetron (ZOFRAN) 4 MG tablet; Take 1 tablet (4 mg total) by mouth every 8 (eight) hours as needed for nausea or vomiting.  Dispense: 20 tablet; Refill: 0  3. Bowel habit changes Just with her last cycle, will stop the recent supplements she started to see if it helps.   She will f/u in 9/23 for her annual exam

## 2021-08-22 DIAGNOSIS — M9907 Segmental and somatic dysfunction of upper extremity: Secondary | ICD-10-CM | POA: Diagnosis not present

## 2021-08-22 DIAGNOSIS — M9902 Segmental and somatic dysfunction of thoracic region: Secondary | ICD-10-CM | POA: Diagnosis not present

## 2021-08-22 DIAGNOSIS — M26619 Adhesions and ankylosis of temporomandibular joint, unspecified side: Secondary | ICD-10-CM | POA: Diagnosis not present

## 2021-08-22 DIAGNOSIS — M9901 Segmental and somatic dysfunction of cervical region: Secondary | ICD-10-CM | POA: Diagnosis not present

## 2021-09-07 DIAGNOSIS — F4311 Post-traumatic stress disorder, acute: Secondary | ICD-10-CM | POA: Diagnosis not present

## 2021-09-18 NOTE — Progress Notes (Signed)
41 y.o. G0P0000 Married White or Caucasian Not Hispanic or Latino female here for annual exam.  She states that her periods have been changing a little bit.  Period Cycle (Days): 32 Period Duration (Days): 3-4 Period Pattern: Regular Menstrual Flow: Moderate Menstrual Control: Other (Comment) (period panties) Dysmenorrhea: (!) Moderate Dysmenorrhea Symptoms: Cramping  She is on daily valtrex for genital hsv. She had rare outbreaks, is on it to protect her husband. They will discuss.   Patient's last menstrual period was 09/09/2021.          Sexually active: Yes.    The current method of family planning is none.   H/O female factor infertility. Exercising: Yes.     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 She has never used smokeless tobacco. She reports current alcohol use. She reports that she does not use drugs. Rare ETOH. Works in [employer].   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    Thyroid disease     Past Surgical History:  Procedure Laterality Date   COLPOSCOPY     NASAL SINUS SURGERY  07/2015   SHOULDER ARTHROSCOPY WITH BICEPS TENDON REPAIR Left 07/21/2019   Procedure: LEFT SHOULDER ARTHROSCOPY WITH BICEPS TENODESIS WITH DEBRIDEMENT OF LABRUM;  Surgeon:  Leandrew Koyanagi, MD;  Location: [place];  Service: Orthopedics;  Laterality: Left;    Current Outpatient Medications  Medication Sig Dispense Refill   acetaminophen (TYLENOL) 325 MG tablet Take 650 mg by mouth every 6 (six) hours as needed.     ALPRAZolam (XANAX) 0.25 MG tablet Take 1 tablet (0.25 mg total) by mouth 2 (two) times daily as needed for anxiety. 20 tablet 0   cetirizine (ZYRTEC) 10 MG tablet Take 1 tablet (10 mg total) by mouth daily. 30 tablet 11   Cholecalciferol (VITAMIN D) 2000 units CAPS Take by mouth.     cyclobenzaprine (FLEXERIL) 5 MG tablet TAKE 1 TABLET BY MOUTH THREE TIMES A DAY AS NEEDED FOR MUSCLE SPASMS 30 tablet 1   ibuprofen (ADVIL,MOTRIN) 200 MG tablet Take 200 mg by mouth every 6 (six) hours as needed.     Magnesium 500 MG TABS      ondansetron (ZOFRAN) 4 MG tablet Take 1 tablet (4 mg total) by mouth every 8 (eight) hours as needed for nausea or vomiting. 20 tablet 0   PROAIR HFA 108 (90 Base) MCG/ACT inhaler INHALE 2 PUFFS EVERY 6 HOURS AS NEEDED FOR WHEEZE OR SHORTNESS OF BREATH 8.5 Inhaler 1   Rimegepant Sulfate (NURTEC) 75 MG TBDP Take by mouth.     valACYclovir (VALTREX) 500 MG tablet TAKE 1 TABLET BY MOUTH EVERY DAY 90 tablet 0   Ashwagandha 500 MG CAPS  Inositol-D Chiro-Inositol 2000-50 MG PACK      Krill Oil (OMEGA-3) 500 MG CAPS      Probiotic Product (PROBIOTIC PO)      No current facility-administered medications for this visit.    Family History  Problem Relation Age of Onset   Migraines Mother    Aneurysm Mother    Cancer Maternal Aunt        ovarian   Cancer Paternal Uncle        colon  MAunt was in her late 82's-50's when she had ovarian cancer. That Aunt had a daughter who is in her 39's. Mom was one of 4 girls. Mom is local. Discussed genetic counseling.  Other MAunt (without ovarian cancer) had genetic testing and was negative.  MGGM with breast cancer, other MGGM with colon cancer  Review of Systems  All other  systems reviewed and are negative.   Exam:   BP 122/64   Pulse 67   Ht '5\' 6"'$  (1.676 m)   Wt 144 lb (65.3 kg)   LMP 09/09/2021   SpO2 100%   BMI 23.24 kg/m   Weight change: '@WEIGHTCHANGE'$ @ Height:   Height: '5\' 6"'$  (167.6 cm)  Ht Readings from Last 3 Encounters:  09/27/21 '5\' 6"'$  (1.676 m)  08/20/21 '5\' 6"'$  (1.676 m)  11/19/19 '5\' 6"'$  (1.676 m)    General appearance: alert, cooperative and appears stated age Head: Normocephalic, without obvious abnormality, atraumatic Neck: no adenopathy, supple, symmetrical, trachea midline and thyroid normal to inspection and palpation Lungs: clear to auscultation bilaterally Cardiovascular: regular rate and rhythm Breasts: normal appearance, no masses or tenderness Abdomen: soft, non-tender; non distended,  no masses,  no organomegaly Extremities: extremities normal, atraumatic, no cyanosis or edema Skin: Skin color, texture, turgor normal. No rashes or lesions Lymph nodes: Cervical, supraclavicular, and axillary nodes normal. No abnormal inguinal nodes palpated Neurologic: Grossly normal   Pelvic: External genitalia:  no lesions              Urethra:  normal appearing urethra with no masses, tenderness or lesions              Bartholins and Skenes: normal                 Vagina: normal appearing vagina with normal color and discharge, no lesions              Cervix: no lesions               Bimanual Exam:  Uterus:  normal size, contour, position, consistency, mobility, non-tender              Adnexa: no mass, fullness, tenderness               Rectovaginal: Confirms               Anus:  normal sphincter tone, no lesions  Gae Dry chaperoned for the exam.  1. Well woman exam Discussed breast self exam Discussed calcium and vit D intake  2. Screening for cervical cancer - Cytology - PAP  3. Family history of ovarian cancer - Ambulatory referral to Genetics  4. History of herpes genitalis We discussed stopping suppression and just  taking it prn - valACYclovir (VALTREX) 500 MG tablet; Take 1 tablet (500 mg total) by mouth daily.  Dispense: 90 tablet; Refill: 3

## 2021-09-19 DIAGNOSIS — M9907 Segmental and somatic dysfunction of upper extremity: Secondary | ICD-10-CM | POA: Diagnosis not present

## 2021-09-19 DIAGNOSIS — M9901 Segmental and somatic dysfunction of cervical region: Secondary | ICD-10-CM | POA: Diagnosis not present

## 2021-09-19 DIAGNOSIS — M9902 Segmental and somatic dysfunction of thoracic region: Secondary | ICD-10-CM | POA: Diagnosis not present

## 2021-09-19 DIAGNOSIS — M26619 Adhesions and ankylosis of temporomandibular joint, unspecified side: Secondary | ICD-10-CM | POA: Diagnosis not present

## 2021-09-21 DIAGNOSIS — F4311 Post-traumatic stress disorder, acute: Secondary | ICD-10-CM | POA: Diagnosis not present

## 2021-09-26 DIAGNOSIS — M9901 Segmental and somatic dysfunction of cervical region: Secondary | ICD-10-CM | POA: Diagnosis not present

## 2021-09-26 DIAGNOSIS — M9902 Segmental and somatic dysfunction of thoracic region: Secondary | ICD-10-CM | POA: Diagnosis not present

## 2021-09-26 DIAGNOSIS — M9907 Segmental and somatic dysfunction of upper extremity: Secondary | ICD-10-CM | POA: Diagnosis not present

## 2021-09-26 DIAGNOSIS — M26619 Adhesions and ankylosis of temporomandibular joint, unspecified side: Secondary | ICD-10-CM | POA: Diagnosis not present

## 2021-09-27 ENCOUNTER — Encounter: Payer: Self-pay | Admitting: Obstetrics and Gynecology

## 2021-09-27 ENCOUNTER — Ambulatory Visit (INDEPENDENT_AMBULATORY_CARE_PROVIDER_SITE_OTHER): Payer: BC Managed Care – PPO | Admitting: Obstetrics and Gynecology

## 2021-09-27 ENCOUNTER — Telehealth: Payer: Self-pay | Admitting: Licensed Clinical Social Worker

## 2021-09-27 ENCOUNTER — Other Ambulatory Visit (HOSPITAL_COMMUNITY)
Admission: RE | Admit: 2021-09-27 | Discharge: 2021-09-27 | Disposition: A | Discharge status: Home / Self Care | Payer: BC Managed Care – PPO | Source: Ambulatory Visit | Attending: Obstetrics and Gynecology | Admitting: Obstetrics and Gynecology

## 2021-09-27 VITALS — BP 122/64 | HR 67 | Ht 66.0 in | Wt 144.0 lb

## 2021-09-27 DIAGNOSIS — Z01419 Encounter for gynecological examination (general) (routine) without abnormal findings: Secondary | ICD-10-CM | POA: Diagnosis not present

## 2021-09-27 DIAGNOSIS — Z124 Encounter for screening for malignant neoplasm of cervix: Secondary | ICD-10-CM | POA: Insufficient documentation

## 2021-09-27 DIAGNOSIS — Z8619 Personal history of other infectious and parasitic diseases: Secondary | ICD-10-CM | POA: Diagnosis not present

## 2021-09-27 DIAGNOSIS — Z8041 Family history of malignant neoplasm of ovary: Secondary | ICD-10-CM | POA: Diagnosis not present

## 2021-09-27 MED ORDER — VALACYCLOVIR HCL 500 MG PO TABS: 500.0000 mg | ORAL_TABLET | Freq: Every day | ORAL | 3 refills | Status: DC

## 2021-09-27 NOTE — Patient Instructions (Signed)

## 2021-09-27 NOTE — Telephone Encounter (Signed)
Scheduled appt per 8/24 referral. Pt is aware of appt date and time. Pt is aware to arrive 15 mins prior to appt time and to bring and updated insurance card. Pt is aware of appt location.   

## 2021-09-28 DIAGNOSIS — F4311 Post-traumatic stress disorder, acute: Secondary | ICD-10-CM | POA: Diagnosis not present

## 2021-10-02 LAB — CYTOLOGY - PAP
Comment: NEGATIVE
Diagnosis: NEGATIVE
High risk HPV: NEGATIVE

## 2021-10-03 DIAGNOSIS — M26619 Adhesions and ankylosis of temporomandibular joint, unspecified side: Secondary | ICD-10-CM | POA: Diagnosis not present

## 2021-10-03 DIAGNOSIS — M9902 Segmental and somatic dysfunction of thoracic region: Secondary | ICD-10-CM | POA: Diagnosis not present

## 2021-10-03 DIAGNOSIS — M9901 Segmental and somatic dysfunction of cervical region: Secondary | ICD-10-CM | POA: Diagnosis not present

## 2021-10-03 DIAGNOSIS — M9907 Segmental and somatic dysfunction of upper extremity: Secondary | ICD-10-CM | POA: Diagnosis not present

## 2021-10-10 DIAGNOSIS — M26619 Adhesions and ankylosis of temporomandibular joint, unspecified side: Secondary | ICD-10-CM | POA: Diagnosis not present

## 2021-10-10 DIAGNOSIS — M9902 Segmental and somatic dysfunction of thoracic region: Secondary | ICD-10-CM | POA: Diagnosis not present

## 2021-10-10 DIAGNOSIS — M9907 Segmental and somatic dysfunction of upper extremity: Secondary | ICD-10-CM | POA: Diagnosis not present

## 2021-10-10 DIAGNOSIS — M9901 Segmental and somatic dysfunction of cervical region: Secondary | ICD-10-CM | POA: Diagnosis not present

## 2021-10-11 ENCOUNTER — Encounter: Payer: Self-pay | Admitting: Obstetrics and Gynecology

## 2021-10-11 DIAGNOSIS — N946 Dysmenorrhea, unspecified: Secondary | ICD-10-CM

## 2021-10-12 DIAGNOSIS — F4311 Post-traumatic stress disorder, acute: Secondary | ICD-10-CM | POA: Diagnosis not present

## 2021-10-18 ENCOUNTER — Other Ambulatory Visit: Payer: Self-pay | Admitting: Obstetrics and Gynecology

## 2021-10-18 DIAGNOSIS — Z1231 Encounter for screening mammogram for malignant neoplasm of breast: Secondary | ICD-10-CM

## 2021-10-18 DIAGNOSIS — M9907 Segmental and somatic dysfunction of upper extremity: Secondary | ICD-10-CM | POA: Diagnosis not present

## 2021-10-18 DIAGNOSIS — M26619 Adhesions and ankylosis of temporomandibular joint, unspecified side: Secondary | ICD-10-CM | POA: Diagnosis not present

## 2021-10-18 DIAGNOSIS — M9901 Segmental and somatic dysfunction of cervical region: Secondary | ICD-10-CM | POA: Diagnosis not present

## 2021-10-18 DIAGNOSIS — M9902 Segmental and somatic dysfunction of thoracic region: Secondary | ICD-10-CM | POA: Diagnosis not present

## 2021-10-19 DIAGNOSIS — F4311 Post-traumatic stress disorder, acute: Secondary | ICD-10-CM | POA: Diagnosis not present

## 2021-10-22 ENCOUNTER — Ambulatory Visit (INDEPENDENT_AMBULATORY_CARE_PROVIDER_SITE_OTHER): Payer: BC Managed Care – PPO | Admitting: Family Medicine

## 2021-10-22 ENCOUNTER — Encounter: Payer: Self-pay | Admitting: Family Medicine

## 2021-10-22 VITALS — BP 98/62 | HR 71 | Temp 98.2°F | Ht 66.5 in | Wt 143.4 lb

## 2021-10-22 DIAGNOSIS — Z23 Encounter for immunization: Secondary | ICD-10-CM

## 2021-10-22 DIAGNOSIS — R1031 Right lower quadrant pain: Secondary | ICD-10-CM | POA: Diagnosis not present

## 2021-10-22 DIAGNOSIS — N926 Irregular menstruation, unspecified: Secondary | ICD-10-CM | POA: Diagnosis not present

## 2021-10-22 DIAGNOSIS — E038 Other specified hypothyroidism: Secondary | ICD-10-CM | POA: Diagnosis not present

## 2021-10-22 DIAGNOSIS — Z0000 Encounter for general adult medical examination without abnormal findings: Secondary | ICD-10-CM

## 2021-10-22 LAB — CBC WITH DIFFERENTIAL/PLATELET
Basophils Absolute: 0.1 10*3/uL (ref 0.0–0.1)
Basophils Relative: 0.8 % (ref 0.0–3.0)
Eosinophils Absolute: 0.3 10*3/uL (ref 0.0–0.7)
Eosinophils Relative: 5.5 % — ABNORMAL HIGH (ref 0.0–5.0)
HCT: 39.9 % (ref 36.0–46.0)
Hemoglobin: 13.4 g/dL (ref 12.0–15.0)
Lymphocytes Relative: 36.2 % (ref 12.0–46.0)
Lymphs Abs: 2.3 10*3/uL (ref 0.7–4.0)
MCHC: 33.7 g/dL (ref 30.0–36.0)
MCV: 92.3 fl (ref 78.0–100.0)
Monocytes Absolute: 0.4 10*3/uL (ref 0.1–1.0)
Monocytes Relative: 6.9 % (ref 3.0–12.0)
Neutro Abs: 3.2 10*3/uL (ref 1.4–7.7)
Neutrophils Relative %: 50.6 % (ref 43.0–77.0)
Platelets: 183 10*3/uL (ref 150.0–400.0)
RBC: 4.32 Mil/uL (ref 3.87–5.11)
RDW: 12.5 % (ref 11.5–15.5)
WBC: 6.4 10*3/uL (ref 4.0–10.5)

## 2021-10-22 LAB — LIPID PANEL
Cholesterol: 197 mg/dL (ref 0–200)
HDL: 58.6 mg/dL (ref >39.00)
LDL Cholesterol: 127 mg/dL — ABNORMAL HIGH (ref 0–99)
NonHDL: 138.51
Total CHOL/HDL Ratio: 3
Triglycerides: 58 mg/dL (ref 0.0–149.0)
VLDL: 11.6 mg/dL (ref 0.0–40.0)

## 2021-10-22 LAB — COMPREHENSIVE METABOLIC PANEL
ALT: 9 U/L (ref 0–35)
AST: 13 U/L (ref 0–37)
Albumin: 4.2 g/dL (ref 3.5–5.2)
Alkaline Phosphatase: 46 U/L (ref 39–117)
BUN: 14 mg/dL (ref 6–23)
CO2: 28 mEq/L (ref 19–32)
Calcium: 8.9 mg/dL (ref 8.4–10.5)
Chloride: 103 mEq/L (ref 96–112)
Creatinine, Ser: 0.75 mg/dL (ref 0.40–1.20)
GFR: 98.79 mL/min (ref >60.00)
Glucose, Bld: 85 mg/dL (ref 70–99)
Potassium: 4.1 mEq/L (ref 3.5–5.1)
Sodium: 139 mEq/L (ref 135–145)
Total Bilirubin: 0.6 mg/dL (ref 0.2–1.2)
Total Protein: 7.4 g/dL (ref 6.0–8.3)

## 2021-10-22 LAB — T4, FREE: Free T4: 0.84 ng/dL (ref 0.60–1.60)

## 2021-10-22 LAB — TSH: TSH: 2.97 u[IU]/mL (ref 0.35–5.50)

## 2021-10-22 LAB — HEMOGLOBIN A1C: Hgb A1c MFr Bld: 5.5 % (ref 4.6–6.5)

## 2021-10-22 NOTE — Progress Notes (Signed)
Subjective:     Elizabeth Contreras is a 41 y.o. female with PMH SIG PCOS, migraines, asthma anxiety, PTSD, HSV, subclinical thyroidism, history of SLAP tear left shoulder who and is here for a comprehensive physical exam.  Pt notes dry lips, inquires about thyroid being checked.  Seen by OB/Gyn.  Notes change in menses.  Now becoming more regular every 20-30 days.  First day heavy with nausea, emesis, loose stools.  Then has spotting the remaining days.  In the past pt noticed bleeding gradually tapered off and she did not have the other symptoms.  This has occurred twice.  Denies increased/intense cramping.  Patient tried taking magnesium for the first time it occurred but noticed continued symptoms.  Was taking chaste tree berry.  Social History   Socioeconomic History   Marital status: Married    Spouse name: Not on file   Number of children: 0   Years of education: 16   Highest education level: Bachelor's degree (e.g., BA, AB, BS)  Occupational History    Comment: unemployed  Tobacco Use   Smoking status: Never   Smokeless tobacco: Never  Vaping Use   Vaping Use: Never used  Substance and Sexual Activity   Alcohol use: Yes    Comment: rarely   Drug use: No   Sexual activity: Yes    Partners: Male    Birth control/protection: None  Other Topics Concern   Not on file  Social History Narrative   Lives with husband   Caffeine -tea 1-2 daily   Social Determinants of Health   Financial Resource Strain: Low Risk  (05/21/2021)   Overall Financial Resource Strain (CARDIA)    Difficulty of Paying Living Expenses: Not hard at all  Food Insecurity: No Food Insecurity (05/21/2021)   Hunger Vital Sign    Worried About Running Out of Food in the Last Year: Never true    Ran Out of Food in the Last Year: Never true  Transportation Needs: No Transportation Needs (05/21/2021)   PRAPARE - Hydrologist (Medical): No    Lack of Transportation (Non-Medical): No   Physical Activity: Sufficiently Active (05/21/2021)   Exercise Vital Sign    Days of Exercise per Week: 6 days    Minutes of Exercise per Session: 30 min  Stress: Stress Concern Present (05/21/2021)   [place]    Feeling of Stress : To some extent  Social Connections: Socially Isolated (05/21/2021)   Social Connection and Isolation Panel [NHANES]    Frequency of Communication with Friends and Family: Once a week    Frequency of Social Gatherings with Friends and Family: Once a week    Attends Religious Services: Never    Marine scientist or Organizations: No    Attends Music therapist: Not on file    Marital Status: Married  Human resources officer Violence: Not on file   Health Maintenance  Topic Date Due   HIV Screening  Never done   Hepatitis C Screening  Never done   COVID-19 Vaccine (4 - Moderna series) 02/14/2020   PAP SMEAR-Modifier  09/27/2024   TETANUS/TDAP  10/18/2028   INFLUENZA VACCINE  Completed   HPV VACCINES  Aged Out    The following portions of the patient's history were reviewed and updated as appropriate: allergies, current medications, past family history, past medical history, past social history, past surgical history, and problem list.  Review of Systems  Pertinent items noted in HPI and remainder of comprehensive ROS otherwise negative.   Objective:    BP 98/62 (BP Location: Left Arm, Patient Position: Sitting, Cuff Size: Normal)   Pulse 71   Temp 98.2 F (36.8 C) (Oral)   Ht 5' 6.5" (1.689 m)   Wt 143 lb 6.4 oz (65 kg)   LMP 10/11/2021 (Exact Date)   SpO2 99%   BMI 22.80 kg/m  General appearance: alert, cooperative, and no distress Head: Normocephalic, without obvious abnormality, atraumatic Eyes: conjunctivae/corneas clear. PERRL, EOM's intact. Fundi benign. Ears: normal TM's and external ear canals both ears Nose: Nares normal. Septum midline. Mucosa normal. No  drainage or sinus tenderness. Throat: lips, mucosa, and tongue normal; teeth and gums normal Neck: no adenopathy, no carotid bruit, no JVD, supple, symmetrical, trachea midline, and thyroid not enlarged, symmetric, no tenderness/mass/nodules Lungs: clear to auscultation bilaterally Heart: regular rate and rhythm, S1, S2 normal, no murmur, click, rub or gallop Abdomen:  BS present, soft, ND, mild TTP in RLQ , no hepatosplenomegaly Extremities: extremities normal, atraumatic, no cyanosis or edema Pulses: 2+ and symmetric Skin: Skin color, texture, turgor normal. No rashes or lesions Lymph nodes: Cervical, supraclavicular, and axillary nodes normal. Neurologic: Alert and oriented X 3, normal strength and tone. Normal symmetric reflexes. Normal coordination and gait    Assessment:    Healthy female exam.      Plan:    Anticipatory guidance given including wearing seatbelts, smoke detectors in the home, increasing physical activity, increasing p.o. intake of water and vegetables. -Labs -Pap with OB/GYN done 10/02/2021 -Pt schedule mammogram -Immunizations reviewed -Given handout -Next CPE in 1 year See After Visit Summary for Counseling Recommendations  Well adult exam - Plan: Hemoglobin A1c, Lipid panel  Need for influenza vaccination  - Plan: Flu Vaccine QUAD 6+ mos PF IM (Fluarix Quad PF)  Right lower quadrant abdominal pain -Noted on exam-given precautions  - Plan: CMP, CBC with Differential/Platelet  Menses, irregular  - Plan: TSH, T4, Free  Follow-up as needed  Grier Mitts, MD

## 2021-10-23 DIAGNOSIS — M25511 Pain in right shoulder: Secondary | ICD-10-CM | POA: Diagnosis not present

## 2021-10-23 DIAGNOSIS — M9907 Segmental and somatic dysfunction of upper extremity: Secondary | ICD-10-CM | POA: Diagnosis not present

## 2021-10-24 DIAGNOSIS — M9907 Segmental and somatic dysfunction of upper extremity: Secondary | ICD-10-CM | POA: Diagnosis not present

## 2021-10-24 DIAGNOSIS — M26619 Adhesions and ankylosis of temporomandibular joint, unspecified side: Secondary | ICD-10-CM | POA: Diagnosis not present

## 2021-10-24 DIAGNOSIS — M9902 Segmental and somatic dysfunction of thoracic region: Secondary | ICD-10-CM | POA: Diagnosis not present

## 2021-10-24 DIAGNOSIS — M9901 Segmental and somatic dysfunction of cervical region: Secondary | ICD-10-CM | POA: Diagnosis not present

## 2021-10-26 DIAGNOSIS — F4311 Post-traumatic stress disorder, acute: Secondary | ICD-10-CM | POA: Diagnosis not present

## 2021-11-07 DIAGNOSIS — M9907 Segmental and somatic dysfunction of upper extremity: Secondary | ICD-10-CM | POA: Diagnosis not present

## 2021-11-07 DIAGNOSIS — M26619 Adhesions and ankylosis of temporomandibular joint, unspecified side: Secondary | ICD-10-CM | POA: Diagnosis not present

## 2021-11-07 DIAGNOSIS — M9901 Segmental and somatic dysfunction of cervical region: Secondary | ICD-10-CM | POA: Diagnosis not present

## 2021-11-07 DIAGNOSIS — M9902 Segmental and somatic dysfunction of thoracic region: Secondary | ICD-10-CM | POA: Diagnosis not present

## 2021-11-09 ENCOUNTER — Encounter: Payer: Self-pay | Admitting: Family Medicine

## 2021-11-09 DIAGNOSIS — M25511 Pain in right shoulder: Secondary | ICD-10-CM | POA: Diagnosis not present

## 2021-11-09 DIAGNOSIS — F4311 Post-traumatic stress disorder, acute: Secondary | ICD-10-CM | POA: Diagnosis not present

## 2021-11-09 DIAGNOSIS — M9907 Segmental and somatic dysfunction of upper extremity: Secondary | ICD-10-CM | POA: Diagnosis not present

## 2021-11-12 DIAGNOSIS — M25511 Pain in right shoulder: Secondary | ICD-10-CM | POA: Diagnosis not present

## 2021-11-12 DIAGNOSIS — M9907 Segmental and somatic dysfunction of upper extremity: Secondary | ICD-10-CM | POA: Diagnosis not present

## 2021-11-12 NOTE — Addendum Note (Signed)
Addended by: Dorothy Spark on: 11/12/2021 05:29 PM   Modules accepted: Orders

## 2021-11-13 ENCOUNTER — Ambulatory Visit
Admission: RE | Admit: 2021-11-13 | Discharge: 2021-11-13 | Disposition: A | Discharge status: Home / Self Care | Payer: BC Managed Care – PPO | Source: Ambulatory Visit | Attending: Obstetrics and Gynecology | Admitting: Obstetrics and Gynecology

## 2021-11-13 DIAGNOSIS — Z1231 Encounter for screening mammogram for malignant neoplasm of breast: Secondary | ICD-10-CM

## 2021-11-14 ENCOUNTER — Other Ambulatory Visit: Payer: Self-pay | Admitting: Obstetrics and Gynecology

## 2021-11-14 DIAGNOSIS — M9907 Segmental and somatic dysfunction of upper extremity: Secondary | ICD-10-CM | POA: Diagnosis not present

## 2021-11-14 DIAGNOSIS — R928 Other abnormal and inconclusive findings on diagnostic imaging of breast: Secondary | ICD-10-CM

## 2021-11-14 DIAGNOSIS — M25511 Pain in right shoulder: Secondary | ICD-10-CM | POA: Diagnosis not present

## 2021-11-16 DIAGNOSIS — F4311 Post-traumatic stress disorder, acute: Secondary | ICD-10-CM | POA: Diagnosis not present

## 2021-11-20 DIAGNOSIS — M25511 Pain in right shoulder: Secondary | ICD-10-CM | POA: Diagnosis not present

## 2021-11-20 DIAGNOSIS — M9907 Segmental and somatic dysfunction of upper extremity: Secondary | ICD-10-CM | POA: Diagnosis not present

## 2021-11-22 DIAGNOSIS — F4311 Post-traumatic stress disorder, acute: Secondary | ICD-10-CM | POA: Diagnosis not present

## 2021-11-26 ENCOUNTER — Inpatient Hospital Stay: Payer: BC Managed Care – PPO | Attending: Obstetrics and Gynecology | Admitting: Licensed Clinical Social Worker

## 2021-11-26 ENCOUNTER — Other Ambulatory Visit: Payer: Self-pay | Admitting: Licensed Clinical Social Worker

## 2021-11-26 ENCOUNTER — Inpatient Hospital Stay: Payer: BC Managed Care – PPO

## 2021-11-26 ENCOUNTER — Encounter: Payer: Self-pay | Admitting: Genetic Counselor

## 2021-11-26 DIAGNOSIS — Z8041 Family history of malignant neoplasm of ovary: Secondary | ICD-10-CM

## 2021-11-26 DIAGNOSIS — Z801 Family history of malignant neoplasm of trachea, bronchus and lung: Secondary | ICD-10-CM

## 2021-11-26 DIAGNOSIS — Z803 Family history of malignant neoplasm of breast: Secondary | ICD-10-CM | POA: Diagnosis not present

## 2021-11-26 DIAGNOSIS — Z800 Family history of malignant neoplasm of digestive organs: Secondary | ICD-10-CM

## 2021-11-26 LAB — GENETIC SCREENING ORDER

## 2021-11-26 NOTE — Progress Notes (Signed)
REFERRING PROVIDER: Salvadore Dom, [address] [place],  [place] [address]  PRIMARY PROVIDER:  Billie Ruddy, MD  PRIMARY REASON FOR VISIT:  1. Family history of ovarian cancer   2. Family history of breast cancer   3. Family history of colon cancer   4. Family history of lung cancer      HISTORY OF PRESENT ILLNESS:   Ms. Faw, a 41 y.o. female, was seen for a [place] cancer genetics consultation at the request of Dr. Talbert Nan due to a family history of cancer.  Ms. Santy presents to clinic today to discuss the possibility of a hereditary predisposition to cancer, genetic testing, and to further clarify her future cancer risks, as well as potential cancer risks for family members.    CANCER HISTORY:  Ms. Hilger is a 41 y.o. female with no personal history of cancer.    RISK FACTORS:  Menarche was at age 39.  OCP use for approximately 10 years.  Ovaries intact: yes.  Hysterectomy: no.  Menopausal status: premenopausal.  HRT use: 0 years. Colonoscopy: no; not examined. Mammogram within the last year: yes; has follow-up ultrasound scheduled. Number of breast biopsies: 0. Up to date with pelvic exams: yes.  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    Thyroid disease     Past Surgical History:  Procedure Laterality Date   COLPOSCOPY     NASAL SINUS SURGERY  07/2015   SHOULDER ARTHROSCOPY WITH BICEPS TENDON REPAIR Left 07/21/2019   Procedure: LEFT SHOULDER ARTHROSCOPY WITH BICEPS TENODESIS WITH DEBRIDEMENT OF LABRUM;  Surgeon: Leandrew Koyanagi, MD;  Location: [facility];  Service: Orthopedics;  Laterality: Left;    FAMILY HISTORY:   We obtained a detailed, 4-generation family history.  Significant diagnoses are listed below: Family History  Problem Relation Age of Onset   Migraines Mother    Aneurysm Mother    Ovarian cancer Maternal Aunt        dx 21s-50s   Colon cancer Paternal Uncle        dx 34s   Lung cancer Maternal Grandfather    Breast cancer Other        MGGM   Ms. Westfall has 1 sister, 15, and 2 nieces.  Ms. Telfair mother is living at 106. Patient has 3 maternal aunts, 1 uncle. One aunt had ovarian cancer in her 59s and passed of it. Another aunt had hereditary cancer genetic testing that was reportedly negative. A maternal great grandmother or great aunt had breast cancer (patient unsure if through her grandfather or grandmother). Maternal grandfather passed in his 59s of lung cancer, history of smoking.  Ms. Pence father is living at 3. A paternal uncle had colon cancer in his 64s and passed of it. No other known cancers on this side of the family.  Ms. Stern is unaware of previous family history of genetic testing for hereditary cancer risks. There is no reported Ashkenazi Jewish ancestry. There is no known consanguinity.    GENETIC COUNSELING ASSESSMENT: Ms. Kuyper is a 41 y.o. female with a family history of ovarian cancer which is somewhat suggestive  of a hereditary cancer syndrome and predisposition to cancer. We, therefore, discussed and recommended the following at today's visit.   DISCUSSION: We discussed that approximately 10-20% of ovarian cancer is hereditary. Most cases of hereditary ovarian cancer are associated with BRCA1/BRCA2 genes, although there are other genes associated with hereditary cancer as well. Cancers and risks are gene specific. We discussed that testing is beneficial for several reasons including knowing about cancer risks, identifying potential screening and risk-reduction options that may be appropriate, and to understand if other family members could be at  risk for cancer and allow them to undergo genetic testing.   We reviewed the characteristics, features and inheritance patterns of hereditary cancer syndromes. We also discussed genetic testing, including the appropriate family members to test, the process of testing, insurance coverage and turn-around-time for results. We discussed the implications of a negative, positive and/or variant of uncertain significant result. We recommended Ms. Reichart pursue genetic testing for the [facility] CancerNext-Expanded+RNA gene panel.   Based on Ms. Harlan's family history of cancer, she meets medical criteria for genetic testing. Despite that she meets criteria, she may still have an out of pocket cost. We discussed that if her out of pocket cost for testing is over $100, the laboratory will call and confirm whether she wants to proceed with testing.  If the out of pocket cost of testing is less than $100 she will be billed by the genetic testing laboratory.   PLAN: After considering the risks, benefits, and limitations, Ms. Danowski provided informed consent to pursue genetic testing and the blood sample was sent to [facility] for analysis of the CancerNext-Expanded+RNA panel. Results should be available within approximately 2-3 weeks' time, at which point they will be disclosed by telephone to Ms. Sachs, as will any additional recommendations warranted by these results. Ms. Guarino will receive a summary of her genetic counseling visit and a copy of her results once available. This information will also be available in Epic.   Ms. Straley questions were answered to her satisfaction today. Our contact information was provided should additional questions or concerns arise. Thank you for the referral and allowing [place] to share in the care of your patient.   Faith Rogue, MS, [facility] Genetic Counselor [email or web address] Phone: [phone]  The patient was seen for a total of 20 minutes in  face-to-face genetic counseling.  Dr. Grayland Ormond was available for discussion regarding this case.   _______________________________________________________________________ For Office Staff:  Number of people involved in session: 1 Was an Intern/ student involved with case: no

## 2021-11-27 DIAGNOSIS — M25511 Pain in right shoulder: Secondary | ICD-10-CM | POA: Diagnosis not present

## 2021-11-27 DIAGNOSIS — M9907 Segmental and somatic dysfunction of upper extremity: Secondary | ICD-10-CM | POA: Diagnosis not present

## 2021-11-30 DIAGNOSIS — F4311 Post-traumatic stress disorder, acute: Secondary | ICD-10-CM | POA: Diagnosis not present

## 2021-12-03 DIAGNOSIS — M9907 Segmental and somatic dysfunction of upper extremity: Secondary | ICD-10-CM | POA: Diagnosis not present

## 2021-12-03 DIAGNOSIS — M25511 Pain in right shoulder: Secondary | ICD-10-CM | POA: Diagnosis not present

## 2021-12-07 DIAGNOSIS — F4311 Post-traumatic stress disorder, acute: Secondary | ICD-10-CM | POA: Diagnosis not present

## 2021-12-13 DIAGNOSIS — F4311 Post-traumatic stress disorder, acute: Secondary | ICD-10-CM | POA: Diagnosis not present

## 2021-12-14 DIAGNOSIS — M9907 Segmental and somatic dysfunction of upper extremity: Secondary | ICD-10-CM | POA: Diagnosis not present

## 2021-12-14 DIAGNOSIS — M25511 Pain in right shoulder: Secondary | ICD-10-CM | POA: Diagnosis not present

## 2021-12-17 ENCOUNTER — Ambulatory Visit
Admission: RE | Admit: 2021-12-17 | Discharge: 2021-12-17 | Disposition: A | Discharge status: Home / Self Care | Payer: BC Managed Care – PPO | Source: Ambulatory Visit | Attending: Obstetrics and Gynecology | Admitting: Obstetrics and Gynecology

## 2021-12-17 ENCOUNTER — Other Ambulatory Visit: Payer: Self-pay | Admitting: Obstetrics and Gynecology

## 2021-12-17 DIAGNOSIS — R928 Other abnormal and inconclusive findings on diagnostic imaging of breast: Secondary | ICD-10-CM

## 2021-12-17 DIAGNOSIS — N6001 Solitary cyst of right breast: Secondary | ICD-10-CM | POA: Diagnosis not present

## 2021-12-17 DIAGNOSIS — N6002 Solitary cyst of left breast: Secondary | ICD-10-CM | POA: Diagnosis not present

## 2021-12-17 DIAGNOSIS — R92333 Mammographic heterogeneous density, bilateral breasts: Secondary | ICD-10-CM | POA: Diagnosis not present

## 2021-12-18 ENCOUNTER — Encounter: Payer: Self-pay | Admitting: Licensed Clinical Social Worker

## 2021-12-18 ENCOUNTER — Telehealth: Payer: Self-pay | Admitting: Licensed Clinical Social Worker

## 2021-12-18 ENCOUNTER — Ambulatory Visit: Payer: Self-pay | Admitting: Licensed Clinical Social Worker

## 2021-12-18 DIAGNOSIS — M9907 Segmental and somatic dysfunction of upper extremity: Secondary | ICD-10-CM | POA: Diagnosis not present

## 2021-12-18 DIAGNOSIS — M25511 Pain in right shoulder: Secondary | ICD-10-CM | POA: Diagnosis not present

## 2021-12-18 DIAGNOSIS — Z1379 Encounter for other screening for genetic and chromosomal anomalies: Secondary | ICD-10-CM | POA: Insufficient documentation

## 2021-12-18 DIAGNOSIS — Z1501 Genetic susceptibility to malignant neoplasm of breast: Secondary | ICD-10-CM

## 2021-12-18 NOTE — Telephone Encounter (Signed)
I contacted Ms. Raulerson to discuss her genetic testing results. 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 No other pathogenic variants were identified in the 77 genes analyzed. Detailed clinic note to follow.   The test report has been scanned into EPIC and is located under the Molecular Pathology section of the Results Review tab.  A portion of the result report is included below for reference.      Faith Rogue, MS, [facility] Genetic Counselor [email or web address] Phone: [phone]

## 2021-12-18 NOTE — Progress Notes (Signed)
HPI:   Elizabeth Contreras was previously seen in the [facility] due to a family history of cancer and concerns regarding a hereditary predisposition to cancer. Please refer to our prior cancer genetics clinic note for more information regarding our discussion, assessment and recommendations, at the time. Elizabeth Contreras recent genetic test results were disclosed to her, as were recommendations warranted by these results. These results and recommendations are discussed in more detail below.  CANCER HISTORY:  Oncology History   No history exists.    FAMILY HISTORY:  We obtained a detailed, 4-generation family history.  Significant diagnoses are listed below: Family History  Problem Relation Age of Onset   Migraines Mother    Aneurysm Mother    Ovarian cancer Maternal Aunt        dx 83s-50s   Colon cancer Paternal Uncle        dx 25s   Lung cancer Maternal Grandfather    Breast cancer Other        MGGM   Elizabeth Contreras has 1 sister, 42, and 2 nieces.   Elizabeth Contreras mother is living at 30. Patient has 3 maternal aunts, 1 uncle. One aunt had ovarian cancer in her 53s and passed of it. Another aunt had hereditary cancer genetic testing that was reportedly negative. A maternal great grandmother or great aunt had breast cancer (patient unsure if through her grandfather or grandmother). Maternal grandfather passed in his 41s of lung cancer, history of smoking.   Ms. Lauritsen father is living at 78. A paternal uncle had colon cancer in his 72s and passed of it. No other known cancers on this side of the family.   Ms. Astarita is unaware of previous family history of genetic testing for hereditary cancer risks. There is no reported Ashkenazi Jewish ancestry. There is no known consanguinity.   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 The remainder of testing was negative/normal.  The  CancerNext-Expanded + RNAinsight gene panel offered by [employer] and includes sequencing and rearrangement analysis for the following 77 genes: IP, ALK, APC*, ATM*, AXIN2, BAP1, BARD1, BLM, BMPR1A, BRCA1*, BRCA2*, BRIP1*, CDC73, CDH1*,CDK4, CDKN1B, CDKN2A, CHEK2*, CTNNA1, DICER1, FANCC, FH, FLCN, GALNT12, KIF1B, LZTR1, MAX, MEN1, MET, MLH1*, MSH2*, MSH3, MSH6*, MUTYH*, NBN, NF1*, NF2, NTHL1, PALB2*, PHOX2B, PMS2*, POT1, PRKAR1A, PTCH1, PTEN*, RAD51C*, RAD51D*,RB1, RECQL, RET, SDHA, SDHAF2, SDHB, SDHC, SDHD, SMAD4, SMARCA4, SMARCB1, SMARCE1, STK11, SUFU, TMEM127, TP53*,TSC1, TSC2, VHL and XRCC2 (sequencing and deletion/duplication); EGFR, EGLN1, HOXB13, KIT, MITF, PDGFRA, POLD1 and POLE (sequencing only); EPCAM and GREM1 (deletion/duplication only). .   The test report has been scanned into EPIC and is located under the Molecular Pathology section of the Results Review tab.  A portion of the result report is included below for reference. Genetic testing reported out on 12/13/2021.      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  At this time, it is unknown if this variant is associated with an increased risk for cancer or if it is benign, but most uncertain variants are reclassified to benign. It should not be used to make medical management decisions. With time, we suspect the laboratory will determine the significance of this variant, if any. If the laboratory reclassifies this variant, we will attempt to contact Elizabeth Contreras to discuss it further.   CHEK2 CANCER RISKS & RECOMMENDATIONS:  We discussed that CHEK2 mutations have been found  to be associated with an increased risk of breast, colon, prostate, and possibly other cancers. 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 Thus,  her mutation is classified as a low penetrance (moderate risk) mutation. Several studies of this low penetrance mutation (c.470T>C) estimated the lifetime risk for breast cancer among carriers to be increased by about 50% over the general population risk.  For those of average breast cancer risk, this low penetrance mutation in isolation is not expected to increase one's breast cancer risk >20%. The risk for contralateral breast cancer has not been well studied in women with the c.470T>C mutation.   Both men and women with a CHEK2 mutation may have an increased risk of colon cancer, and men have a possible increased prostate cancer risk. Precise lifetime risk estimates are not well established. There is preliminary evidence supporting a correlation with CHEK2 and autosomal dominant predisposition to other cancer types; however, the available evidence is insufficient to make a determination regarding these relationships.   Cancer risks in families with the same CHEK2 mutation can vary widely, suggesting that there are likely to be other factors involved that we don't understand yet that also contribute to the cancer risks associated with CHEK2 mutations. An individual's cancer risk and medical management are not determined by genetic test results alone. Overall cancer risk assessment incorporates additional factors, including personal medical history, family history, and any available genetic information that may result in a personalized plan for cancer prevention and surveillance.    According to the [place] [phone]) and ACMG (2023; PMID: [id number]).  Breast Cancer Management:   Low penetrance variants in CHEK2 are not clinically actionable in isolation for breast cancer risk.  Breast surveillance should be based on personalized assessment including family history as a minimum  Harriett Rush was performed for Elizabeth Contreras, which shows her risk based on personal factors and family  history to be 14%:       Colon Cancer Management:  Personal history of colon cancer: Manage based on personal history. Do not have a personal history of colon cancer but have a parent/sibling/child with colon cancer:  Colonoscopy every 5 years starting at age 80 or 86 years younger than the earliest age of onset, whichever is younger. Do not have a personal history of colon cancer and do not have a parent/sibling/child with colon cancer:  Colonoscopy every 5 years starting at age 77.   Per ACMG, colon cancer screening should be based on family history and not a CHEK2 c.470T>C mutation in isolation.  Thus, it is reasonable for some individuals with CHEK2 low penetrance variants to opt for less aggressive screening based on shared decision making with their gastroenterologist.    Prostate Cancer Management:  There is emerging evidence associated with pathogenic variants in CHEK2 and prostate cancer. Specific prostate cancer risk associated with c.470T>C variants is unclear at this time.  Consider prostate cancer screening beginning at age 60   Implications for Family Members: Hereditary predisposition to cancer due to pathogenic variants in the CHEK2 gene has autosomal dominant inheritance. This means that an individual with a pathogenic variant has a 50% chance of passing the condition on to his/her offspring. Identification of a pathogenic variant allows for the recognition of at-risk relatives who can pursue testing for the familial variant.    Family  members are encouraged to consider genetic testing for this familial pathogenic variant. As there are generally no childhood cancer risks associated with pathogenic variants in the CHEK2 gene, individuals in the family are not recommended to have testing until they reach at least 41 years of age. Complimentary testing for the familial variant is available for 90 days. They may contact our office at [phone] for more information or to schedule an  appointment. Family members who live outside of the area are encouraged to find a genetic counselor in their area by visiting: [email or web address].    Resources: FORCE (Facing Our Risk of Cancer Empowered) is a resource for those with a hereditary predisposition to develop cancer.  FORCE provides information about risk reduction, advocacy, legislation, and clinical trials.  Additionally, FORCE provides a platform for collaboration and support; which includes: peer navigation, message boards, local support groups, a toll-free helpline, research registry and recruitment, advocate training, published medical research, webinars, brochures, mastectomy photos, and more.  For more information, visit www.facingourrisk.org   Plan:  Changes in cancer screening based on this result are likely not indicated at this time. Elizabeth Contreras would like referral to high risk breast clinic to discuss further.  Family letter was provided to encourage family testing for this variant.    FOLLOW-UP:  Lastly, we discussed with Elizabeth Contreras that cancer genetics is a rapidly advancing field and it is possible that new genetic tests will be appropriate for her and/or her family members in the future. We encouraged her to remain in contact with cancer genetics on an annual basis so we can update her personal and family histories and let her know of advances in cancer genetics that may benefit this family.   Our contact number was provided. Elizabeth Contreras questions were answered to her satisfaction, and she knows she is welcome to call us at anytime with additional questions or concerns.    Faith Rogue, MS, [facility] Genetic Counselor [email or web address] Phone: [phone]

## 2021-12-19 ENCOUNTER — Telehealth: Payer: Self-pay | Admitting: Hematology and Oncology

## 2021-12-19 DIAGNOSIS — M9901 Segmental and somatic dysfunction of cervical region: Secondary | ICD-10-CM | POA: Diagnosis not present

## 2021-12-19 DIAGNOSIS — M9902 Segmental and somatic dysfunction of thoracic region: Secondary | ICD-10-CM | POA: Diagnosis not present

## 2021-12-19 DIAGNOSIS — M26619 Adhesions and ankylosis of temporomandibular joint, unspecified side: Secondary | ICD-10-CM | POA: Diagnosis not present

## 2021-12-19 DIAGNOSIS — M9907 Segmental and somatic dysfunction of upper extremity: Secondary | ICD-10-CM | POA: Diagnosis not present

## 2021-12-19 NOTE — Telephone Encounter (Signed)
Scheduled appointment per 11/13 referral. Patient is aware of appointment date and time. Patient is aware to arrive 15 mins prior to appointment time and to bring updated insurance cards. Patient is aware of location.   

## 2021-12-21 DIAGNOSIS — F4311 Post-traumatic stress disorder, acute: Secondary | ICD-10-CM | POA: Diagnosis not present

## 2021-12-28 DIAGNOSIS — F4311 Post-traumatic stress disorder, acute: Secondary | ICD-10-CM | POA: Diagnosis not present

## 2021-12-31 ENCOUNTER — Telehealth: Payer: Self-pay | Admitting: Obstetrics and Gynecology

## 2021-12-31 DIAGNOSIS — Z1502 Genetic susceptibility to malignant neoplasm of ovary: Secondary | ICD-10-CM

## 2021-12-31 NOTE — Telephone Encounter (Signed)
Left detailed message on patient voicemail per DPR access. I asked patient to call with answer.

## 2021-12-31 NOTE — Telephone Encounter (Signed)
Please let the patient know that I have reviewed her visit with the Genetic Counselor. She recommended she start colonoscopies now. If she is agreeable, please place a referral to Dr Thornton Park at [place]

## 2021-12-31 NOTE — Telephone Encounter (Signed)
Left message for patient to call.

## 2022-01-07 ENCOUNTER — Telehealth: Payer: Self-pay | Admitting: Hematology and Oncology

## 2022-01-07 NOTE — Telephone Encounter (Signed)
Patient called to r/s upcoming appointment. Patient is r/s and notified.

## 2022-01-08 ENCOUNTER — Other Ambulatory Visit: Payer: BC Managed Care – PPO

## 2022-01-08 ENCOUNTER — Other Ambulatory Visit: Payer: BC Managed Care – PPO | Admitting: Obstetrics and Gynecology

## 2022-01-10 DIAGNOSIS — F4311 Post-traumatic stress disorder, acute: Secondary | ICD-10-CM | POA: Diagnosis not present

## 2022-01-10 NOTE — Telephone Encounter (Signed)
Dr.Jertson please see patient response.

## 2022-01-15 ENCOUNTER — Inpatient Hospital Stay: Payer: BC Managed Care – PPO

## 2022-01-15 ENCOUNTER — Inpatient Hospital Stay: Payer: BC Managed Care – PPO | Admitting: Hematology and Oncology

## 2022-01-23 ENCOUNTER — Other Ambulatory Visit: Payer: BC Managed Care – PPO

## 2022-01-23 ENCOUNTER — Encounter: Payer: BC Managed Care – PPO | Admitting: Hematology and Oncology

## 2022-01-23 ENCOUNTER — Encounter: Payer: Self-pay | Admitting: Family Medicine

## 2022-01-23 ENCOUNTER — Telehealth (INDEPENDENT_AMBULATORY_CARE_PROVIDER_SITE_OTHER): Payer: BC Managed Care – PPO | Admitting: Family Medicine

## 2022-01-23 VITALS — Temp 97.8°F | Ht 66.0 in | Wt 140.0 lb

## 2022-01-23 DIAGNOSIS — U071 COVID-19: Secondary | ICD-10-CM

## 2022-01-23 NOTE — Progress Notes (Signed)
Virtual Visit via Video Note  I connected withNAME@ on 01/23/22 at 10:15 AM EST by a video enabled telemedicine application 2/2 [id number] pandemic and verified that I am speaking with the correct person using two identifiers.  Location patient: home Location provider:work or home office Persons participating in the virtual visit: patient, provider  I discussed the limitations of evaluation and management by telemedicine and the availability of in person appointments. The patient expressed understanding and agreed to proceed.  Chief Complaint  Patient presents with   Covid Positive    Tested positive for covid on 01/21/2022. Reports sx of congestion, cough, pressure on ears, bodyache-subsided, fatigue-improved. Denied fever. Sx started on last Thursday. Currently taking sudafed.      HPI: Pt is a 41 yo female with pmh sig for asthma, migraines, PCOS, anxiety, PTSD, vit D def, SLAP tear L shoulder, subclinical hypothyroidism who was seen for acute concern.   Pt's husband tested positive for COVID on Thursday.  Pt developed a HA and fatigue on Thursday.  The majority of symptoms started Sunday with bodyaches, congestion, ear pressure, decreased taste.   Tested positive for COVID on Monday after several initially negative test. Pt feeling somewhat better each day.  Still having some congestion, cough, mild HA. Denies fever, chills, nausea, vomiting, sore throat, decreased appetite. Took Tylenol, sudafed, afrin x 2 days. Patient had several COVID vaccines.  ROS: See pertinent positives and negatives per HPI.  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    Thyroid disease     Past Surgical History:   Procedure Laterality Date   COLPOSCOPY     NASAL SINUS SURGERY  07/2015   SHOULDER ARTHROSCOPY WITH BICEPS TENDON REPAIR Left 07/21/2019   Procedure: LEFT SHOULDER ARTHROSCOPY WITH BICEPS TENODESIS WITH DEBRIDEMENT OF LABRUM;  Surgeon: Leandrew Koyanagi, MD;  Location: [facility];  Service: Orthopedics;  Laterality: Left;    Family History  Problem Relation Age of Onset   Migraines Mother    Aneurysm Mother    Ovarian cancer Maternal Aunt        dx 25s-50s   Colon cancer Paternal Uncle        dx 29s   Lung cancer Maternal Grandfather    Breast cancer Other        MGGM    Current Outpatient Medications:    acetaminophen (TYLENOL) 325 MG tablet, Take 650 mg by mouth every 6 (six) hours as needed., Disp: , Rfl:    ALPRAZolam (XANAX) 0.25 MG tablet, Take 1 tablet (0.25 mg total) by mouth 2 (two) times daily as needed for anxiety., Disp: 20 tablet, Rfl: 0   Ashwagandha 500 MG CAPS, , Disp: , Rfl:    cetirizine (ZYRTEC) 10 MG tablet, Take 1 tablet (10 mg total) by mouth daily., Disp: 30 tablet, Rfl: 11   Cholecalciferol (VITAMIN D) 2000 units CAPS, Take by mouth., Disp: , Rfl:    cyclobenzaprine (FLEXERIL) 5 MG tablet, TAKE 1 TABLET BY MOUTH THREE TIMES A DAY AS NEEDED FOR MUSCLE SPASMS, Disp: 30 tablet, Rfl: 1   ibuprofen (ADVIL,MOTRIN) 200 MG tablet, Take 200 mg by mouth every 6 (six) hours  as needed., Disp: , Rfl:    Inositol-D Chiro-Inositol 2000-50 MG PACK, , Disp: , Rfl:    Krill Oil (OMEGA-3) 500 MG CAPS, , Disp: , Rfl:    Magnesium 500 MG TABS, , Disp: , Rfl:    PROAIR HFA 108 (90 Base) MCG/ACT inhaler, INHALE 2 PUFFS EVERY 6 HOURS AS NEEDED FOR WHEEZE OR SHORTNESS OF BREATH, Disp: 8.5 Inhaler, Rfl: 1   Probiotic Product (PROBIOTIC PO), , Disp: , Rfl:    Rimegepant Sulfate (NURTEC) 75 MG TBDP, Take by mouth., Disp: , Rfl:    valACYclovir (VALTREX) 500 MG tablet, Take 1 tablet (500 mg total) by mouth daily., Disp: 90 tablet, Rfl: 3   ondansetron (ZOFRAN) 4 MG tablet,  Take 1 tablet (4 mg total) by mouth every 8 (eight) hours as needed for nausea or vomiting. (Patient not taking: Reported on 01/23/2022), Disp: 20 tablet, Rfl: 0  EXAM:  VITALS per patient if applicable: RR between 46-27 bpm  GENERAL: alert, oriented, appears well and in no acute distress  HEENT: atraumatic, conjunctiva clear, no obvious abnormalities on inspection of external nose and ears  NECK: normal movements of the head and neck  LUNGS: on inspection no signs of respiratory distress, breathing rate appears normal, no obvious gross SOB, gasping or wheezing  CV: no obvious cyanosis  MS: moves all visible extremities without noticeable abnormality  PSYCH/NEURO: pleasant and cooperative, no obvious depression or anxiety, speech and thought processing grossly intact  ASSESSMENT AND PLAN:  Discussed the following assessment and plan:  COVID-19 virus infection -Starting last Thursday with positive test Monday, 01/21/2022 -Improving -Continue supportive care with OTC cough/cold medications, Flonase or saline nasal rinse, Tylenol as needed, rest, hydration. -Discussed likely duration of symptoms and quarantine recommendations. -Given strict precautions  Follow-up as needed   I discussed the assessment and treatment plan with the patient. The patient was provided an opportunity to ask questions and all were answered. The patient agreed with the plan and demonstrated an understanding of the instructions.   The patient was advised to call back or seek an in-person evaluation if the symptoms worsen or if the condition fails to improve as anticipated.  Billie Ruddy, MD

## 2022-01-25 DIAGNOSIS — F4311 Post-traumatic stress disorder, acute: Secondary | ICD-10-CM | POA: Diagnosis not present

## 2022-01-30 DIAGNOSIS — M9907 Segmental and somatic dysfunction of upper extremity: Secondary | ICD-10-CM | POA: Diagnosis not present

## 2022-01-30 DIAGNOSIS — M26619 Adhesions and ankylosis of temporomandibular joint, unspecified side: Secondary | ICD-10-CM | POA: Diagnosis not present

## 2022-01-30 DIAGNOSIS — M9902 Segmental and somatic dysfunction of thoracic region: Secondary | ICD-10-CM | POA: Diagnosis not present

## 2022-01-30 DIAGNOSIS — M9901 Segmental and somatic dysfunction of cervical region: Secondary | ICD-10-CM | POA: Diagnosis not present

## 2022-02-07 ENCOUNTER — Inpatient Hospital Stay: Payer: BC Managed Care – PPO

## 2022-02-07 ENCOUNTER — Encounter: Payer: Self-pay | Admitting: Hematology and Oncology

## 2022-02-07 ENCOUNTER — Other Ambulatory Visit: Payer: Self-pay

## 2022-02-07 ENCOUNTER — Inpatient Hospital Stay: Payer: BC Managed Care – PPO | Attending: Obstetrics and Gynecology | Admitting: Hematology and Oncology

## 2022-02-07 VITALS — BP 107/68 | HR 68 | Temp 98.6°F | Resp 16 | Ht 66.0 in | Wt 141.2 lb

## 2022-02-07 DIAGNOSIS — R9230 Dense breasts, unspecified: Secondary | ICD-10-CM

## 2022-02-07 DIAGNOSIS — Z8041 Family history of malignant neoplasm of ovary: Secondary | ICD-10-CM | POA: Diagnosis not present

## 2022-02-07 DIAGNOSIS — Z800 Family history of malignant neoplasm of digestive organs: Secondary | ICD-10-CM | POA: Diagnosis not present

## 2022-02-07 DIAGNOSIS — Z1509 Genetic susceptibility to other malignant neoplasm: Secondary | ICD-10-CM | POA: Diagnosis not present

## 2022-02-07 DIAGNOSIS — Z801 Family history of malignant neoplasm of trachea, bronchus and lung: Secondary | ICD-10-CM | POA: Diagnosis not present

## 2022-02-07 DIAGNOSIS — Z803 Family history of malignant neoplasm of breast: Secondary | ICD-10-CM

## 2022-02-07 DIAGNOSIS — Z1501 Genetic susceptibility to malignant neoplasm of breast: Secondary | ICD-10-CM | POA: Diagnosis not present

## 2022-02-07 DIAGNOSIS — Z1502 Genetic susceptibility to malignant neoplasm of ovary: Secondary | ICD-10-CM | POA: Insufficient documentation

## 2022-02-07 NOTE — Progress Notes (Signed)
[facility] CONSULT NOTE  Patient Care Team: Billie Ruddy, MD as PCP - General (Family Medicine) Presson, Audelia Hives, [place] as Referring Physician (Physician Assistant) Salvadore Dom, MD as Consulting Physician (Obstetrics and Gynecology)  CHIEF COMPLAINTS/PURPOSE OF CONSULTATION:  Newly diagnosed breast cancer  HISTORY OF PRESENTING ILLNESS:  Elizabeth Contreras 42 y.o. 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  I reviewed her records extensively and collaborated the history with the patient.  SUMMARY OF ONCOLOGIC HISTORY: Oncology History   No history exists.   Ms. Luda was recently seen by our genetics team given family history of ovarian cancer in maternal aunt, breast cancer in maternal great grandmother and colon cancer in paternal uncle.  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  No use of hormone replacement therapy.  She was wondering if there is any role for MRI screening given her dense breasts especially with this mutation and her family history.  She is healthy at baseline except for migraines and uses medication for migraine.  Rest of the pertinent 10 point ROS reviewed and negative  Rest of the pertinent 10 point ROS reviewed and negative  MEDICAL HISTORY:  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    Thyroid disease     SURGICAL HISTORY: Past Surgical History:  Procedure Laterality Date   COLPOSCOPY     NASAL SINUS SURGERY  07/2015   SHOULDER ARTHROSCOPY WITH BICEPS  TENDON REPAIR Left 07/21/2019   Procedure: LEFT SHOULDER ARTHROSCOPY WITH BICEPS TENODESIS WITH DEBRIDEMENT OF LABRUM;  Surgeon: Leandrew Koyanagi, MD;  Location: [facility];  Service: Orthopedics;  Laterality: Left;    SOCIAL HISTORY: Social History   Socioeconomic History   Marital status: Married    Spouse name: Not on file   Number of children: 0   Years of education: 16   Highest education level: Bachelor's degree (e.g., BA, AB, BS)  Occupational History    Comment: unemployed  Tobacco Use   Smoking status: Never   Smokeless tobacco: Never  Vaping Use   Vaping Use: Never used  Substance and Sexual Activity   Alcohol use: Yes    Comment: rarely   Drug use: No   Sexual activity: Yes    Partners: Male    Birth control/protection: None  Other Topics Concern   Not on file  Social History Narrative   Lives with husband   Caffeine -tea 1-2 daily   Social Determinants of Health   Financial Resource Strain: Low Risk  (05/21/2021)   Overall Financial Resource Strain (CARDIA)    Difficulty of Paying Living Expenses: Not hard at all  Food Insecurity: No Food Insecurity (05/21/2021)   Hunger Vital Sign    Worried About Running Out of Food in the Last Year: Never true    Ran Out of Food in the Last Year: Never true  Transportation Needs: No Transportation Needs (05/21/2021)   PRAPARE - Hydrologist (Medical):  No    Lack of Transportation (Non-Medical): No  Physical Activity: Sufficiently Active (05/21/2021)   Exercise Vital Sign    Days of Exercise per Week: 6 days    Minutes of Exercise per Session: 30 min  Stress: Stress Concern Present (05/21/2021)   [place]    Feeling of Stress : To some extent  Social Connections: Socially Isolated (05/21/2021)   Social Connection and Isolation Panel [NHANES]    Frequency of Communication with Friends and Family: Once a week     Frequency of Social Gatherings with Friends and Family: Once a week    Attends Religious Services: Never    Marine scientist or Organizations: No    Attends Music therapist: Not on file    Marital Status: Married  Human resources officer Violence: Not on file    FAMILY HISTORY: Family History  Problem Relation Age of Onset   Migraines Mother    Aneurysm Mother    Ovarian cancer Maternal Aunt        dx 18s-50s   Colon cancer Paternal Uncle        dx 7s   Lung cancer Maternal Grandfather    Breast cancer Other        MGGM    ALLERGIES:  is allergic to topamax [topiramate].  MEDICATIONS:  Current Outpatient Medications  Medication Sig Dispense Refill   acetaminophen (TYLENOL) 325 MG tablet Take 650 mg by mouth every 6 (six) hours as needed.     ALPRAZolam (XANAX) 0.25 MG tablet Take 1 tablet (0.25 mg total) by mouth 2 (two) times daily as needed for anxiety. 20 tablet 0   Ashwagandha 500 MG CAPS      cetirizine (ZYRTEC) 10 MG tablet Take 1 tablet (10 mg total) by mouth daily. 30 tablet 11   Cholecalciferol (VITAMIN D) 2000 units CAPS Take by mouth.     cyclobenzaprine (FLEXERIL) 5 MG tablet TAKE 1 TABLET BY MOUTH THREE TIMES A DAY AS NEEDED FOR MUSCLE SPASMS 30 tablet 1   ibuprofen (ADVIL,MOTRIN) 200 MG tablet Take 200 mg by mouth every 6 (six) hours as needed.     Inositol-D Chiro-Inositol 2000-50 MG PACK      Krill Oil (OMEGA-3) 500 MG CAPS      Magnesium 500 MG TABS      ondansetron (ZOFRAN) 4 MG tablet Take 1 tablet (4 mg total) by mouth every 8 (eight) hours as needed for nausea or vomiting. (Patient not taking: Reported on 01/23/2022) 20 tablet 0   PROAIR HFA 108 (90 Base) MCG/ACT inhaler INHALE 2 PUFFS EVERY 6 HOURS AS NEEDED FOR WHEEZE OR SHORTNESS OF BREATH 8.5 Inhaler 1   Probiotic Product (PROBIOTIC PO)      Rimegepant Sulfate (NURTEC) 75 MG TBDP Take by mouth.     valACYclovir (VALTREX) 500 MG tablet Take 1 tablet (500 mg total) by mouth daily. 90  tablet 3   No current facility-administered medications for this visit.     PHYSICAL EXAMINATION: ECOG PERFORMANCE STATUS: 0 - Asymptomatic  Vitals:   02/07/22 1011  BP: 107/68  Pulse: 68  Resp: 16  Temp: 98.6 F (37 C)  SpO2: 100%   Filed Weights   02/07/22 1011  Weight: 141 lb 3.2 oz (64 kg)    GENERAL:alert, no distress and comfortable Breast: Bilateral breast examined.  She does have very dense breasts hence the physical examination is limited.  No major findings, no obvious palpable  masses or regional adenopathy on exam today. Lower extremities: No lower extremity edema  LABORATORY DATA:  I have reviewed the data as listed Lab Results  Component Value Date   WBC 6.4 10/22/2021   HGB 13.4 10/22/2021   HCT 39.9 10/22/2021   MCV 92.3 10/22/2021   PLT 183.0 10/22/2021   Lab Results  Component Value Date   NA 139 10/22/2021   K 4.1 10/22/2021   CL 103 10/22/2021   CO2 28 10/22/2021    RADIOGRAPHIC STUDIES: I have personally reviewed the radiological reports and agreed with the findings in the report.  ASSESSMENT AND PLAN:   Her lifetime risk of breast cancer based on TC model calculation genetic testing was 14.5%. Given lifetime risk of breast cancer under 20% at this time, she may not qualify for MRI breast screening.  We also discussed that MRI is sensitive however not specific hence may lead to additional biopsies and long-term risk of gadolinium deposition at this time is unknown.  She wants to wait until repeat ultrasound scheduled in May 2024 and will let us know if she wants to consider MRI screening.  If she is not covered by her insurance for MRI screening, she may also consider abbreviated MRI if she is very worried about the dense breasts and had genetic changes.  2. Lifestyle modification: We discussed different interventions exercise at least 5 days a week including both aerobic as well as weight-bearing exercises and strength training. We discussed  dietary modification including increasing number of servings of fruit and vegetables as well as decreased in number of servings of meat. We discussed the importance of maintaining a good BMI and limiting alcohol intake.  3. Surveillance: Patient certainly would be a good candidate for ongoing mammograms on a yearly basis. Certainly she could benefit from a 3-D mammogram. We discussed self breast examination as well as ongoing clinical examination.   5. Chemoprevention: We did not discuss about this at this time.  I do not believe she will qualify for this at this time.  6. 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 The patient knows to call the clinic with any problems, questions or concerns.    Benay Pike, MD 02/07/22

## 2022-02-08 DIAGNOSIS — F4311 Post-traumatic stress disorder, acute: Secondary | ICD-10-CM | POA: Diagnosis not present

## 2022-02-13 ENCOUNTER — Telehealth: Payer: Self-pay

## 2022-02-13 ENCOUNTER — Other Ambulatory Visit: Payer: Self-pay

## 2022-02-13 DIAGNOSIS — N946 Dysmenorrhea, unspecified: Secondary | ICD-10-CM

## 2022-02-13 NOTE — Telephone Encounter (Signed)
Patient has appt for ultrasound tomorrow for dysmenorrhea.  Her menses started today and she wants to know if she needs to reschedule the u/s appt?

## 2022-02-13 NOTE — Telephone Encounter (Signed)
Per DPR access note on file left detailed message in patient's voice mail and informed her, per written note from Dr. Dewitt Hoes, that she does not need to reschedule the u/s.

## 2022-02-14 ENCOUNTER — Telehealth (INDEPENDENT_AMBULATORY_CARE_PROVIDER_SITE_OTHER): Payer: BC Managed Care – PPO | Admitting: Obstetrics and Gynecology

## 2022-02-14 ENCOUNTER — Ambulatory Visit (INDEPENDENT_AMBULATORY_CARE_PROVIDER_SITE_OTHER): Payer: BC Managed Care – PPO

## 2022-02-14 ENCOUNTER — Encounter: Payer: Self-pay | Admitting: Obstetrics and Gynecology

## 2022-02-14 DIAGNOSIS — N946 Dysmenorrhea, unspecified: Secondary | ICD-10-CM

## 2022-02-14 NOTE — Progress Notes (Signed)
Virtual Visit via Video Note  I connected with Elizabeth Contreras on 02/14/22 at  4:15 PM EST by a video enabled telemedicine application and verified that I am speaking with the correct person using two identifiers.  Location: Patient: Work Provider: office at [employer]   I discussed the limitations of evaluation and management by telemedicine and the availability of in person appointments. The patient expressed understanding and agreed to proceed.  GYNECOLOGY  VISIT   HPI: 42 y.o.   Married White or Caucasian Not Hispanic or Latino  female   [id number] with No LMP recorded.   here for a virtual visit to discuss dysmenorrhea.  Menses q 30-32 days for 3-4 days. She wears period panties, on her heavy day thinks she would change a pad in 3-4 hours. Cramps vary from one cycle to the next.  She has had some episodes of diarrhea, emesis and severe cramps with her cycle. Hasn't happened since 9/23. Mostly cramps on the first 2-3 days. Bad for one day. She is taking Aleve, OTC, which helps.   She has a FH of ovarian cancer and was seen last year for Genetic counseling.  She has a mutation in CHEK2, which increase her risk of Breast and colon CA Her TC risk of breast cancer is 14.3% Colonoscopy recommended q 5 years starting at 13  GYNECOLOGIC HISTORY: No LMP recorded. 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         OB History     Gravida  0   Para  0   Term  0   Preterm  0   AB  0   Living  0      SAB  0   IAB  0   Ectopic  0   Multiple  0   Live Births  0              Patient Active Problem List   Diagnosis Date Noted   Genetic testing 12/18/2021   Superior labrum anterior-to-posterior (SLAP) tear of left shoulder 06/04/2019   Anxiety 02/26/2019   PTSD (post-traumatic stress disorder) 02/26/2019   Subclinical hypothyroidism 11/21/2016   Vitamin D deficiency 11/21/2016   Common migraine with intractable migraine 06/20/2016    Past  Medical History:  Diagnosis Date   Asthma    Common migraine with intractable migraine 06/20/2016   Herpes    Migraine without aura    PCOS (polycystic ovarian syndrome)    PONV (postoperative nausea and vomiting)    PTSD (post-traumatic stress disorder) 2015   Sinusitis    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    Thyroid disease     Past Surgical History:  Procedure Laterality Date   COLPOSCOPY     NASAL SINUS SURGERY  07/2015   SHOULDER ARTHROSCOPY WITH BICEPS TENDON REPAIR Left 07/21/2019   Procedure: LEFT SHOULDER ARTHROSCOPY WITH BICEPS TENODESIS WITH DEBRIDEMENT OF LABRUM;  Surgeon: Leandrew Koyanagi, MD;  Location: [facility];  Service: Orthopedics;  Laterality: Left;    Current Outpatient Medications  Medication Sig Dispense Refill   acetaminophen (TYLENOL) 325 MG tablet Take 650 mg by mouth every 6 (six) hours as needed.     ALPRAZolam (XANAX) 0.25 MG tablet Take 1 tablet (0.25 mg total) by mouth 2 (two) times daily as needed for anxiety. 20 tablet 0   Ashwagandha 500 MG CAPS      cetirizine (ZYRTEC) 10 MG tablet Take 1 tablet (10 mg total)  by mouth daily. 30 tablet 11   Cholecalciferol (VITAMIN D) 2000 units CAPS Take by mouth.     cyclobenzaprine (FLEXERIL) 5 MG tablet TAKE 1 TABLET BY MOUTH THREE TIMES A DAY AS NEEDED FOR MUSCLE SPASMS 30 tablet 1   ibuprofen (ADVIL,MOTRIN) 200 MG tablet Take 200 mg by mouth every 6 (six) hours as needed.     Inositol-D Chiro-Inositol 2000-50 MG PACK      Krill Oil (OMEGA-3) 500 MG CAPS      Magnesium 500 MG TABS      ondansetron (ZOFRAN) 4 MG tablet Take 1 tablet (4 mg total) by mouth every 8 (eight) hours as needed for nausea or vomiting. (Patient not taking: Reported on 01/23/2022) 20 tablet 0   PROAIR HFA 108 (90 Base) MCG/ACT inhaler INHALE 2 PUFFS EVERY 6 HOURS AS NEEDED FOR WHEEZE OR SHORTNESS OF BREATH 8.5 Inhaler 1   Probiotic Product (PROBIOTIC PO)      Rimegepant Sulfate (NURTEC) 75 MG TBDP Take by  mouth.     valACYclovir (VALTREX) 500 MG tablet Take 1 tablet (500 mg total) by mouth daily. 90 tablet 3   No current facility-administered medications for this visit.     ALLERGIES: Topamax [topiramate]  Family History  Problem Relation Age of Onset   Migraines Mother    Aneurysm Mother    Ovarian cancer Maternal Aunt        dx 20s-50s   Colon cancer Paternal Uncle        dx 57s   Lung cancer Maternal Grandfather    Breast cancer Other        MGGM    Social History   Socioeconomic History   Marital status: Married    Spouse name: Not on file   Number of children: 0   Years of education: 16   Highest education level: Bachelor's degree (e.g., BA, AB, BS)  Occupational History    Comment: unemployed  Tobacco Use   Smoking status: Never   Smokeless tobacco: Never  Vaping Use   Vaping Use: Never used  Substance and Sexual Activity   Alcohol use: Yes    Comment: rarely   Drug use: No   Sexual activity: Yes    Partners: Male    Birth control/protection: None  Other Topics Concern   Not on file  Social History Narrative   Lives with husband   Caffeine -tea 1-2 daily   Social Determinants of Health   Financial Resource Strain: Low Risk  (05/21/2021)   Overall Financial Resource Strain (CARDIA)    Difficulty of Paying Living Expenses: Not hard at all  Food Insecurity: No Food Insecurity (05/21/2021)   Hunger Vital Sign    Worried About Running Out of Food in the Last Year: Never true    Ran Out of Food in the Last Year: Never true  Transportation Needs: No Transportation Needs (05/21/2021)   PRAPARE - Hydrologist (Medical): No    Lack of Transportation (Non-Medical): No  Physical Activity: Sufficiently Active (05/21/2021)   Exercise Vital Sign    Days of Exercise per Week: 6 days    Minutes of Exercise per Session: 30 min  Stress: Stress Concern Present (05/21/2021)   [place]    Feeling of Stress : To some extent  Social Connections: Socially Isolated (05/21/2021)   Social Connection and Isolation Panel [NHANES]    Frequency of Communication with Friends and Family:  Once a week    Frequency of Social Gatherings with Friends and Family: Once a week    Attends Religious Services: Never    Marine scientist or Organizations: No    Attends Music therapist: Not on file    Marital Status: Married  Human resources officer Violence: Not on file    ROS  PHYSICAL EXAMINATION:    There were no vitals taken for this visit.    General appearance: alert, cooperative and appears stated age  Pelvic ultrasound from earlier today  Indications: Dysmenorrhea  Findings:  Uterus 7.42 x 5.57 x 4.24 cm retroverted Fibroids: 1) 1.23 x 0.82, LUS/Subserosal 2) 0.95 x 0.53 cm, fundal/subserosal  Endometrium 4.09 mm, avascular, symmetrical  Left ovary 3.22 x 1.91 x 1.76 cm  Right ovary 3.36 x 2.42 x 2.15 cm  No free fluid  Impression:   Normal sized, retroverted uterus 2 small, subserosal fibroids,  Thin, uniform endometrium Normal ovaries bilaterally  1. Severe dysmenorrhea No concerning findings on ultrasound Discussed options of prescription NSAID's, OCP's, mirena IUD She is fine with continuing on OTC NSAID's for now. Will reach out if she wants to do something else.

## 2022-02-15 DIAGNOSIS — F4311 Post-traumatic stress disorder, acute: Secondary | ICD-10-CM | POA: Diagnosis not present

## 2022-02-22 DIAGNOSIS — F4311 Post-traumatic stress disorder, acute: Secondary | ICD-10-CM | POA: Diagnosis not present

## 2022-02-27 DIAGNOSIS — M9901 Segmental and somatic dysfunction of cervical region: Secondary | ICD-10-CM | POA: Diagnosis not present

## 2022-02-27 DIAGNOSIS — M9902 Segmental and somatic dysfunction of thoracic region: Secondary | ICD-10-CM | POA: Diagnosis not present

## 2022-02-27 DIAGNOSIS — M26619 Adhesions and ankylosis of temporomandibular joint, unspecified side: Secondary | ICD-10-CM | POA: Diagnosis not present

## 2022-02-27 DIAGNOSIS — M9907 Segmental and somatic dysfunction of upper extremity: Secondary | ICD-10-CM | POA: Diagnosis not present

## 2022-03-08 DIAGNOSIS — F4311 Post-traumatic stress disorder, acute: Secondary | ICD-10-CM | POA: Diagnosis not present

## 2022-03-11 ENCOUNTER — Encounter: Payer: Self-pay | Admitting: Family Medicine

## 2022-03-14 DIAGNOSIS — F4311 Post-traumatic stress disorder, acute: Secondary | ICD-10-CM | POA: Diagnosis not present

## 2022-03-18 ENCOUNTER — Other Ambulatory Visit: Payer: Self-pay | Admitting: Family Medicine

## 2022-03-18 DIAGNOSIS — G43019 Migraine without aura, intractable, without status migrainosus: Secondary | ICD-10-CM

## 2022-03-20 ENCOUNTER — Telehealth (INDEPENDENT_AMBULATORY_CARE_PROVIDER_SITE_OTHER): Payer: BC Managed Care – PPO | Admitting: Family Medicine

## 2022-03-20 ENCOUNTER — Encounter: Payer: Self-pay | Admitting: Family Medicine

## 2022-03-20 VITALS — Ht 66.0 in | Wt 135.0 lb

## 2022-03-20 DIAGNOSIS — G43409 Hemiplegic migraine, not intractable, without status migrainosus: Secondary | ICD-10-CM

## 2022-03-20 MED ORDER — NURTEC 75 MG PO TBDP: 75.0000 mg | ORAL_TABLET | ORAL | 2 refills | Status: DC | PRN

## 2022-03-20 NOTE — Progress Notes (Signed)
Virtual Visit via Video Note  I connected with Elizabeth Contreras on 03/20/22 at  9:30 AM EST by a video enabled telemedicine application 2/2 [id number] pandemic and verified that I am speaking with the correct person using two identifiers.  Location patient: home Location provider:work or home office Persons participating in the virtual visit: patient, provider  I discussed the limitations of evaluation and management by telemedicine and the availability of in person appointments. The patient expressed understanding and agreed to proceed.  Chief Complaint  Patient presents with   Migraine   Referral    HPI:  Patient is a 42 year old female with pmh sig for migraines, subclinical hypothyroidism, anxiety, PTSD, vitamin D deficiency who was seen for follow-up on migraines.  Patient previously seen at a migraine clinic, but it recently closed.  A referral to [facility] was placed and patient has an appointment coming up in the next 2 weeks.  Pt seen today due to having a different type of migraine from her usual.  Her L hand went numb while in a mtg.  It resolved.  Then pt had spots in her vision and migraine developed.  Took Nurtec and Aleeve which did not resolve the HA.  Usually the nurtec alone will get rid of a HA.  Pt trying to recall anything that could have triggered HA.  Took a really hot shower the night before and felt really tired after.  Pt felt like she needed to sleep in the next morning which she typically doesn't do.  Pt also used a little more oregano in her salad dressing.  Pt denies changes in foods, hydration.  Pt had 6 or 7 migraines in the last month.  Only had 2 in Dec.   ROS: See pertinent positives and negatives per HPI.  Past Medical History:  Diagnosis Date   Asthma    Common migraine with intractable migraine 06/20/2016   Herpes    Migraine without aura    PCOS (polycystic ovarian syndrome)    PONV (postoperative nausea and vomiting)    PTSD (post-traumatic stress  disorder) 2015   Sinusitis    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    Thyroid disease     Past Surgical History:  Procedure Laterality Date   COLPOSCOPY     NASAL SINUS SURGERY  07/2015   SHOULDER ARTHROSCOPY WITH BICEPS TENDON REPAIR Left 07/21/2019   Procedure: LEFT SHOULDER ARTHROSCOPY WITH BICEPS TENODESIS WITH DEBRIDEMENT OF LABRUM;  Surgeon: Leandrew Koyanagi, MD;  Location: [facility];  Service: Orthopedics;  Laterality: Left;    Family History  Problem Relation Age of Onset   Migraines Mother    Aneurysm Mother    Ovarian cancer Maternal Aunt        dx 44s-50s   Colon cancer Paternal Uncle        dx 33s   Lung cancer Maternal Grandfather    Breast cancer Other        MGGM     Current Outpatient Medications:    acetaminophen (TYLENOL) 325 MG tablet, Take 650 mg by mouth every 6 (six) hours as needed., Disp: , Rfl:    ALPRAZolam (XANAX) 0.25 MG tablet, Take 1 tablet (0.25 mg total) by mouth 2 (two) times daily as needed for anxiety., Disp: 20 tablet, Rfl: 0   Ashwagandha 500 MG CAPS, , Disp: , Rfl:    cetirizine (ZYRTEC) 10 MG tablet, Take 1 tablet (10 mg total) by mouth daily., Disp: 30  tablet, Rfl: 11   Cholecalciferol (VITAMIN D) 2000 units CAPS, Take by mouth., Disp: , Rfl:    cyclobenzaprine (FLEXERIL) 5 MG tablet, TAKE 1 TABLET BY MOUTH THREE TIMES A DAY AS NEEDED FOR MUSCLE SPASMS, Disp: 30 tablet, Rfl: 1   ibuprofen (ADVIL,MOTRIN) 200 MG tablet, Take 200 mg by mouth every 6 (six) hours as needed., Disp: , Rfl:    Inositol-D Chiro-Inositol 2000-50 MG PACK, , Disp: , Rfl:    Krill Oil (OMEGA-3) 500 MG CAPS, , Disp: , Rfl:    Magnesium 500 MG TABS, , Disp: , Rfl:    ondansetron (ZOFRAN) 4 MG tablet, Take 1 tablet (4 mg total) by mouth every 8 (eight) hours as needed for nausea or vomiting., Disp: 20 tablet, Rfl: 0   PROAIR HFA 108 (90 Base) MCG/ACT inhaler, INHALE 2 PUFFS EVERY 6 HOURS AS NEEDED FOR WHEEZE OR SHORTNESS OF BREATH, Disp: 8.5  Inhaler, Rfl: 1   Probiotic Product (PROBIOTIC PO), , Disp: , Rfl:    Rimegepant Sulfate (NURTEC) 75 MG TBDP, Take by mouth., Disp: , Rfl:    valACYclovir (VALTREX) 500 MG tablet, Take 1 tablet (500 mg total) by mouth daily., Disp: 90 tablet, Rfl: 3  EXAM:  VITALS per patient if applicable: RR between 123456 bpm  GENERAL: alert, oriented, appears well and in no acute distress  HEENT: atraumatic, conjunctiva clear, no obvious abnormalities on inspection of external nose and ears  NECK: normal movements of the head and neck  LUNGS: on inspection no signs of respiratory distress, breathing rate appears normal, no obvious gross SOB, gasping or wheezing  CV: no obvious cyanosis  MS: moves all visible extremities without noticeable abnormality  PSYCH/NEURO: pleasant and cooperative, no obvious depression or anxiety, speech and thought processing grossly intact  ASSESSMENT AND PLAN:  Discussed the following assessment and plan:  Hemiplegic migraine without status migrainosus, not intractable  -discussed Migraine prevention including rest, hydration, magnesium, vitamin B2, co-Q10. -Nurtec refilled. -Headache diary -Patient encouraged to keep upcoming neurology appointment. -Given change in headache will obtain imaging.  [place] - Plan: CT HEAD WO CONTRAST (5MM), Rimegepant Sulfate (NURTEC) 75 MG TBDP    I discussed the assessment and treatment plan with the patient. The patient was provided an opportunity to ask questions and all were answered. The patient agreed with the plan and demonstrated an understanding of the instructions.   The patient was advised to call back or seek an in-person evaluation if the symptoms worsen or if the condition fails to improve as anticipated.  I provided 18 minutes of non-face-to-face time during this encounter.   Billie Ruddy, MD

## 2022-03-22 DIAGNOSIS — F4311 Post-traumatic stress disorder, acute: Secondary | ICD-10-CM | POA: Diagnosis not present

## 2022-03-26 DIAGNOSIS — M9901 Segmental and somatic dysfunction of cervical region: Secondary | ICD-10-CM | POA: Diagnosis not present

## 2022-03-26 DIAGNOSIS — M9902 Segmental and somatic dysfunction of thoracic region: Secondary | ICD-10-CM | POA: Diagnosis not present

## 2022-03-26 DIAGNOSIS — M26619 Adhesions and ankylosis of temporomandibular joint, unspecified side: Secondary | ICD-10-CM | POA: Diagnosis not present

## 2022-03-26 DIAGNOSIS — R519 Headache, unspecified: Secondary | ICD-10-CM | POA: Diagnosis not present

## 2022-03-28 DIAGNOSIS — F4311 Post-traumatic stress disorder, acute: Secondary | ICD-10-CM | POA: Diagnosis not present

## 2022-03-29 DIAGNOSIS — M26619 Adhesions and ankylosis of temporomandibular joint, unspecified side: Secondary | ICD-10-CM | POA: Diagnosis not present

## 2022-03-29 DIAGNOSIS — R519 Headache, unspecified: Secondary | ICD-10-CM | POA: Diagnosis not present

## 2022-03-29 DIAGNOSIS — M9901 Segmental and somatic dysfunction of cervical region: Secondary | ICD-10-CM | POA: Diagnosis not present

## 2022-03-29 DIAGNOSIS — M9902 Segmental and somatic dysfunction of thoracic region: Secondary | ICD-10-CM | POA: Diagnosis not present

## 2022-04-03 DIAGNOSIS — R519 Headache, unspecified: Secondary | ICD-10-CM | POA: Diagnosis not present

## 2022-04-03 DIAGNOSIS — M9902 Segmental and somatic dysfunction of thoracic region: Secondary | ICD-10-CM | POA: Diagnosis not present

## 2022-04-03 DIAGNOSIS — M9901 Segmental and somatic dysfunction of cervical region: Secondary | ICD-10-CM | POA: Diagnosis not present

## 2022-04-03 DIAGNOSIS — M26619 Adhesions and ankylosis of temporomandibular joint, unspecified side: Secondary | ICD-10-CM | POA: Diagnosis not present

## 2022-04-05 DIAGNOSIS — F4311 Post-traumatic stress disorder, acute: Secondary | ICD-10-CM | POA: Diagnosis not present

## 2022-04-10 ENCOUNTER — Encounter: Payer: Self-pay | Admitting: Psychiatry

## 2022-04-10 ENCOUNTER — Ambulatory Visit: Payer: BC Managed Care – PPO | Admitting: Psychiatry

## 2022-04-10 ENCOUNTER — Encounter: Payer: Self-pay | Admitting: Gastroenterology

## 2022-04-10 VITALS — BP 90/59 | HR 67 | Ht 66.0 in | Wt 141.8 lb

## 2022-04-10 DIAGNOSIS — G43119 Migraine with aura, intractable, without status migrainosus: Secondary | ICD-10-CM | POA: Diagnosis not present

## 2022-04-10 DIAGNOSIS — Z8249 Family history of ischemic heart disease and other diseases of the circulatory system: Secondary | ICD-10-CM

## 2022-04-10 MED ORDER — EMGALITY 120 MG/ML ~~LOC~~ SOAJ: 1.0000 | SUBCUTANEOUS | 8 refills | Status: DC

## 2022-04-10 MED ORDER — EMGALITY 120 MG/ML ~~LOC~~ SOAJ: 2.0000 | SUBCUTANEOUS | 0 refills | Status: DC

## 2022-04-10 NOTE — Patient Instructions (Addendum)
Plan: Start Emgality monthly injection for migraine prevention Try Ubrelvy sample for migraine rescue. Take one pill at onset of migraine, may repeat a dose in 2 hours if headache persists. CTA of the head to look at the blood vessels in the head  Migraine aura Migraines can be preceded by neurological symptoms called auras. Auras can have many different symptoms including vision changes (zigzags, kaleidoscope vision, flashing lights, blurred vision), numbness and tingling (usually on once side of the hand or face), difficulty with language (reading or speaking), and weakness on one side. Many times these symptoms will occur before a migraine headache, but auras can occur without a headache as well. Auras can naturally change over time and may convert from one type to another (people with visual aura may eventually develop a sensory aura, etc).   Natural supplements that can reduce migraines: Magnesium Oxide or Magnesium Glycinate 500 mg at bed Coenzyme Q10 300 mg in AM Vitamin B2- 200 mg twice a day  Add 1 supplement at a time since even natural supplements can have undesirable side effects. You can sometimes buy supplements cheaper (especially Coenzyme Q10) at [email or web address]-perez.com/ or at [employer].  Magnesium: Magnesium (250 mg twice a day or 500 mg at bed) has a relaxant effect on smooth muscles such as blood vessels. Individuals suffering from frequent or daily headache usually have low magnesium levels which can be increase with daily supplementation of 400-800 mg. Three trials found 40-90% average headache reduction  when used as a preventative. Magnesium also demonstrated the benefit in menstrually related migraine.  Magnesium is part of the messenger system in the serotonin cascade and it is a good muscle relaxant.  It is also useful for constipation. Good sources include nuts, whole grains, and tomatoes. Side Effects: loose stool/diarrhea  Riboflavin (vitamin B 2) 200 mg twice a day. This vitamin  assists nerve cells in the production of ATP a principal energy storing molecule.  It is necessary for many chemical reactions in the body.  There have been at least 3 clinical trials of riboflavin using 400 mg per day all of which suggested that migraine frequency can be decreased.  All 3 trials showed significant improvement in over half of migraine sufferers.  The supplement is found in bread, cereal, milk, meat, and poultry.  Most Americans get more riboflavin than the recommended daily allowance, however riboflavin deficiency is not necessary for the supplements to help prevent headache. Side effects: energizing, green urine  Coenzyme Q10: This is present in almost all cells in the body and is critical component for the conversion of energy.  Recent studies have shown that a nutritional supplement of CoQ10 can reduce the frequency of migraine attacks by improving the energy production of cells as with riboflavin.  Doses of 150 mg twice a day or 300 mg in the morning have been shown to be effective.

## 2022-04-10 NOTE — Progress Notes (Signed)
Referring:  Billie Ruddy, MD [address] [place],  [place] [address]  PCP: Billie Ruddy, MD  Neurology was asked to evaluate Elizabeth Contreras, a 42 year old female for a chief complaint of headaches.  Our recommendations of care will be communicated by shared medical record.    CC:  headaches  History provided from self  HPI:  Medical co-morbidities: hypothyroidism, anxiety, PTSD, asthma  The patient presents for evaluation of headaches which began 8 years ago. Migraines are associated with photophobia, phonophobia, and nausea. Last month she had a migraine with vision changes and hand numbness. Her hand went numb for 5 minutes, then she developed "clear squiggles" in her right eye, then she had difficulty repeating phrases (said it sounded like gibberish). This resolved, then she started to develop her typical migraine headache. She took Nurtec and Aleve, but the headache persisted for the rest of the day.  She typically has 2-6 migraines per month. They can last 1-2 hours with treatment, though her more severe migraines can last up to 24 hours at a time.  She has a [place] scheduled for 04/18/22  Headache History: Onset: 8 years ago Aura: vision changes, hand numbness Associated Symptoms:  Photophobia: yes  Phonophobia: yes  Nausea: yes Worse with activity?: yes Duration of headaches: 1-2 hours with treatment, up to 24 hours without treatment  Migraine days per month: 6 Headache free days per month: 24  Current Treatment: Abortive Nurtec Aleve  Preventative none  Prior Therapies                                 Rescue: Tylenol Imitrex - palpitations Maxalt Zomig nasal spray Nurtec 75 mg PRN Flexeril PRN Cefaly  Prevention: Atenolol - lightheadedness Topamax - cognitive side effects Prozac Nurtec every other day - lack of efficacy  LABS: CBC    Component Value Date/Time   WBC 6.4 10/22/2021 0934   RBC 4.32 10/22/2021 0934   HGB 13.4  10/22/2021 0934   HCT 39.9 10/22/2021 0934   PLT 183.0 10/22/2021 0934   MCV 92.3 10/22/2021 0934   MCH 31.9 11/19/2019 1023   MCHC 33.7 10/22/2021 0934   RDW 12.5 10/22/2021 0934   LYMPHSABS 2.3 10/22/2021 0934   MONOABS 0.4 10/22/2021 0934   EOSABS 0.3 10/22/2021 0934   BASOSABS 0.1 10/22/2021 0934      Latest Ref Rng & Units 10/22/2021    9:34 AM 11/19/2019   10:23 AM 10/19/2018   10:59 AM  CMP  Glucose 70 - 99 mg/dL 85  72  68   BUN 6 - 23 mg/dL '14  8  12   '$ Creatinine 0.40 - 1.20 mg/dL 0.75  0.54  0.74   Sodium 135 - 145 mEq/L 139  139  140   Potassium 3.5 - 5.1 mEq/L 4.1  4.0  4.1   Chloride 96 - 112 mEq/L 103  103  101   CO2 19 - 32 mEq/L '28  25  26   '$ Calcium 8.4 - 10.5 mg/dL 8.9  8.9  9.1   Total Protein 6.0 - 8.3 g/dL 7.4  7.1  7.3   Total Bilirubin 0.2 - 1.2 mg/dL 0.6  0.6  0.4   Alkaline Phos 39 - 117 U/L 46   55   AST 0 - 37 U/L '13  19  15   '$ ALT 0 - 35 U/L 9  15  9  IMAGING:  States she had a normal brain MRI several years ago, results unavailable for review  Current Outpatient Medications on File Prior to Visit  Medication Sig Dispense Refill   acetaminophen (TYLENOL) 325 MG tablet Take 650 mg by mouth every 6 (six) hours as needed.     ALPRAZolam (XANAX) 0.25 MG tablet Take 1 tablet (0.25 mg total) by mouth 2 (two) times daily as needed for anxiety. 20 tablet 0   Ashwagandha 500 MG CAPS      cetirizine (ZYRTEC) 10 MG tablet Take 1 tablet (10 mg total) by mouth daily. 30 tablet 11   Cholecalciferol (VITAMIN D) 2000 units CAPS Take by mouth.     cyclobenzaprine (FLEXERIL) 5 MG tablet TAKE 1 TABLET BY MOUTH THREE TIMES A DAY AS NEEDED FOR MUSCLE SPASMS 30 tablet 1   ibuprofen (ADVIL,MOTRIN) 200 MG tablet Take 200 mg by mouth every 6 (six) hours as needed.     Inositol-D Chiro-Inositol 2000-50 MG PACK      Krill Oil (OMEGA-3) 500 MG CAPS      Magnesium 500 MG TABS      ondansetron (ZOFRAN) 4 MG tablet Take 1 tablet (4 mg total) by mouth every 8 (eight)  hours as needed for nausea or vomiting. 20 tablet 0   PROAIR HFA 108 (90 Base) MCG/ACT inhaler INHALE 2 PUFFS EVERY 6 HOURS AS NEEDED FOR WHEEZE OR SHORTNESS OF BREATH 8.5 Inhaler 1   Probiotic Product (PROBIOTIC PO)      Rimegepant Sulfate (NURTEC) 75 MG TBDP Take by mouth.     Rimegepant Sulfate (NURTEC) 75 MG TBDP Take 1 tablet (75 mg total) by mouth as needed. 8 tablet 2   valACYclovir (VALTREX) 500 MG tablet Take 1 tablet (500 mg total) by mouth daily. 90 tablet 3   No current facility-administered medications on file prior to visit.     Allergies: Allergies  Allergen Reactions   Topamax [Topiramate]     Cognitive slowing    Family History: Migraine or other headaches in the family:  no Aneurysms in a first degree relative:  mother has an aneurysm Brain tumors in the family:  no Other neurological illness in the family:   mother has parkinsonian disorder  Past Medical History: 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    Thyroid disease     Past Surgical History Past Surgical History:  Procedure Laterality Date   COLPOSCOPY     NASAL SINUS SURGERY  07/2015   SHOULDER ARTHROSCOPY WITH BICEPS TENDON REPAIR Left 07/21/2019   Procedure: LEFT SHOULDER ARTHROSCOPY WITH BICEPS TENODESIS WITH DEBRIDEMENT OF LABRUM;  Surgeon: Leandrew Koyanagi, MD;  Location: [facility];  Service: Orthopedics;  Laterality: Left;    Social History: Social History   Tobacco Use   Smoking status: Never   Smokeless tobacco: Never  Vaping Use   Vaping Use: Never used  Substance Use Topics   Alcohol use: Yes    Comment: rarely   Drug use: No    ROS: Negative for fevers, chills. Positive  for headaches. All other systems reviewed and negative unless stated otherwise in HPI.   Physical Exam:  Vital Signs: BP (!) 90/59 (BP Location: Left Arm, Patient Position: Sitting, Cuff Size: Small)   Pulse 67   Ht '5\' 6"'$  (1.676 m)   Wt 141 lb 12.8 oz (64.3 kg)   LMP 03/17/2022   BMI 22.89 kg/m  GENERAL: well appearing,in no acute distress,alert SKIN:  Color, texture, turgor normal. No rashes or lesions HEAD:  Normocephalic/atraumatic. CV:  RRR RESP: Normal respiratory effort MSK: no tenderness to palpation over occiput, neck, or shoulders  NEUROLOGICAL: Mental Status: Alert, oriented to person, place and time,Follows commands Cranial Nerves: PERRL, visual fields intact to confrontation, extraocular movements intact, facial sensation intact, no facial droop or ptosis, hearing grossly intact, no dysarthria Motor: muscle strength 5/5 both upper and lower extremities,no drift, normal tone Reflexes: 2+ throughout Sensation: intact to light touch all 4 extremities Coordination: Finger-to- nose-finger intact bilaterally Gait: normal-based   IMPRESSION: 42 year old female with a history of hypothyroidism, anxiety, PTSD, asthma who presents for evaluation of migraines with vision changes and numbness. Her presentation is most consistent with migraine aura. CTH is scheduled for next week. Will add CTA head as she has a history of first degree relative with aneurysm. Exam today is normal. Discussed migraine treatment options and she is interested in trying a preventive. Will start Emgality for migraine prevention. Ubrelvy sample provided for rescue. Will send in rx if this is more effective than Nurtec.  PLAN: -CTH scheduled for next week -CTA head for aneurysm screening -Prevention: Start Emgality -Rescue: Continue Nurtec 75 mg PRN for now. Ubrelvy sample provided, will send rx if this is more effective    I spent a total of 30 minutes chart reviewing and counseling the patient.  Headache education was done. Discussed treatment options including preventive and acute medications, and natural supplements. Discussed medication side effects, adverse reactions and drug interactions. Written educational materials and patient instructions outlining all of the above were given.  Follow-up: 8 months   Genia Harold, MD 04/10/2022   9:03 AM

## 2022-04-16 ENCOUNTER — Telehealth: Payer: Self-pay

## 2022-04-16 NOTE — Telephone Encounter (Signed)
Received a faxed from [facility] on a notification of CT scan head or brain without contrast.  It states: your pre-authorization request for the services above has been approved.  Your authorization number is [id number].

## 2022-04-18 ENCOUNTER — Telehealth: Payer: Self-pay | Admitting: Psychiatry

## 2022-04-18 ENCOUNTER — Ambulatory Visit
Admission: RE | Admit: 2022-04-18 | Discharge: 2022-04-18 | Disposition: A | Discharge status: Home / Self Care | Payer: BC Managed Care – PPO | Source: Ambulatory Visit | Attending: Family Medicine | Admitting: Family Medicine

## 2022-04-18 ENCOUNTER — Encounter: Payer: Self-pay | Admitting: Family Medicine

## 2022-04-18 DIAGNOSIS — G43409 Hemiplegic migraine, not intractable, without status migrainosus: Secondary | ICD-10-CM

## 2022-04-18 DIAGNOSIS — R200 Anesthesia of skin: Secondary | ICD-10-CM | POA: Diagnosis not present

## 2022-04-18 DIAGNOSIS — R519 Headache, unspecified: Secondary | ICD-10-CM | POA: Diagnosis not present

## 2022-04-18 DIAGNOSIS — G43909 Migraine, unspecified, not intractable, without status migrainosus: Secondary | ICD-10-CM | POA: Diagnosis not present

## 2022-04-18 NOTE — Telephone Encounter (Signed)
BCBS no auth required via [facility] E sent to GI [phone]

## 2022-04-19 DIAGNOSIS — F4311 Post-traumatic stress disorder, acute: Secondary | ICD-10-CM | POA: Diagnosis not present

## 2022-04-23 ENCOUNTER — Encounter: Payer: Self-pay | Admitting: Psychiatry

## 2022-04-23 ENCOUNTER — Encounter: Payer: Self-pay | Admitting: Family Medicine

## 2022-04-23 DIAGNOSIS — M26619 Adhesions and ankylosis of temporomandibular joint, unspecified side: Secondary | ICD-10-CM | POA: Diagnosis not present

## 2022-04-23 DIAGNOSIS — M9902 Segmental and somatic dysfunction of thoracic region: Secondary | ICD-10-CM | POA: Diagnosis not present

## 2022-04-23 DIAGNOSIS — R519 Headache, unspecified: Secondary | ICD-10-CM | POA: Diagnosis not present

## 2022-04-23 DIAGNOSIS — M9901 Segmental and somatic dysfunction of cervical region: Secondary | ICD-10-CM | POA: Diagnosis not present

## 2022-04-23 MED ORDER — UBRELVY 100 MG PO TABS: 100.0000 mg | ORAL_TABLET | ORAL | 6 refills | Status: DC | PRN

## 2022-04-26 DIAGNOSIS — F4311 Post-traumatic stress disorder, acute: Secondary | ICD-10-CM | POA: Diagnosis not present

## 2022-05-02 ENCOUNTER — Encounter: Payer: Self-pay | Admitting: Psychiatry

## 2022-05-03 DIAGNOSIS — F4311 Post-traumatic stress disorder, acute: Secondary | ICD-10-CM | POA: Diagnosis not present

## 2022-05-06 ENCOUNTER — Telehealth: Payer: Self-pay | Admitting: *Deleted

## 2022-05-06 ENCOUNTER — Other Ambulatory Visit (HOSPITAL_COMMUNITY): Payer: Self-pay

## 2022-05-06 NOTE — Telephone Encounter (Signed)
Pharmacy Patient Advocate Encounter   Received notification from [place] that prior authorization for Emgality 120MG /ML auto-injectors (migraine) 2 ML Loading Dose is required/requested.   PA submitted on 05/06/2022 to (ins) [place] [employer] [place] via [employer] or (Medicaid) confirmation # [id number] Status is pending

## 2022-05-06 NOTE — Telephone Encounter (Signed)
Pharmacy Patient Advocate Encounter   Received notification from [place] that prior authorization for Ubrelvy 100MG  Tablets is required/requested.   PA submitted on 05/06/2023 to (ins) [place] [employer] [place] via [employer] or [facility]) confirmation # [id number]  Status is pending

## 2022-05-06 NOTE — Telephone Encounter (Signed)
Pt sent mychart message stating PA needed for both Emgality and Ubrelvy. Can you please help with these? Thank you

## 2022-05-07 ENCOUNTER — Other Ambulatory Visit (HOSPITAL_COMMUNITY): Payer: Self-pay

## 2022-05-07 NOTE — Telephone Encounter (Signed)
Pharmacy Patient Advocate Encounter  Prior Authorization for Ubrelvy 100MG  tablets has been approved by [employer] (ins).    PA # PA Case ID: [id number] Effective dates: 04/06/2022 through 05/06/2022    Pharmacy Patient Advocate Encounter  Prior Authorization for [employer] 120MG /ML auto-injectors (migraine) has been approved by [employer] [place] (ins).    PA # No PA Number provided Effective dates: 04/06/2022 through 11/05/2022

## 2022-05-07 NOTE — Telephone Encounter (Signed)
Please call pt and let her know both [place] and [employer] approved by insurance. She should be able to pick up both from pharmacy now

## 2022-05-08 NOTE — Telephone Encounter (Signed)
Called pt left a voicemail message (per DPR) that both [place] and [employer] approved by insurance. She should be able to pick up both from pharmacy now.

## 2022-05-10 DIAGNOSIS — F4311 Post-traumatic stress disorder, acute: Secondary | ICD-10-CM | POA: Diagnosis not present

## 2022-05-21 DIAGNOSIS — M9901 Segmental and somatic dysfunction of cervical region: Secondary | ICD-10-CM | POA: Diagnosis not present

## 2022-05-21 DIAGNOSIS — M9902 Segmental and somatic dysfunction of thoracic region: Secondary | ICD-10-CM | POA: Diagnosis not present

## 2022-05-21 DIAGNOSIS — R519 Headache, unspecified: Secondary | ICD-10-CM | POA: Diagnosis not present

## 2022-05-21 DIAGNOSIS — M26619 Adhesions and ankylosis of temporomandibular joint, unspecified side: Secondary | ICD-10-CM | POA: Diagnosis not present

## 2022-05-23 ENCOUNTER — Ambulatory Visit
Admission: RE | Admit: 2022-05-23 | Discharge: 2022-05-23 | Disposition: A | Discharge status: Home / Self Care | Payer: BC Managed Care – PPO | Source: Ambulatory Visit | Attending: Psychiatry | Admitting: Psychiatry

## 2022-05-23 DIAGNOSIS — I6509 Occlusion and stenosis of unspecified vertebral artery: Secondary | ICD-10-CM | POA: Diagnosis not present

## 2022-05-23 DIAGNOSIS — Z8249 Family history of ischemic heart disease and other diseases of the circulatory system: Secondary | ICD-10-CM

## 2022-05-23 MED ORDER — IOPAMIDOL (ISOVUE-370) INJECTION 76%
75.0000 mL | Freq: Once | INTRAVENOUS | Status: AC | PRN
  Administered 2022-05-23: 75 mL via INTRAVENOUS

## 2022-05-24 DIAGNOSIS — F4311 Post-traumatic stress disorder, acute: Secondary | ICD-10-CM | POA: Diagnosis not present

## 2022-05-27 NOTE — Progress Notes (Signed)
Please call and inform patient that her CT Angiogram was negative. No evidence of aneurysm.

## 2022-05-28 ENCOUNTER — Encounter: Payer: Self-pay | Admitting: *Deleted

## 2022-06-04 ENCOUNTER — Ambulatory Visit: Payer: BC Managed Care – PPO | Admitting: Gastroenterology

## 2022-06-04 ENCOUNTER — Encounter: Payer: Self-pay | Admitting: Gastroenterology

## 2022-06-04 VITALS — BP 90/50 | HR 72 | Ht 65.5 in | Wt 144.5 lb

## 2022-06-04 DIAGNOSIS — Z1211 Encounter for screening for malignant neoplasm of colon: Secondary | ICD-10-CM

## 2022-06-04 DIAGNOSIS — Z1589 Genetic susceptibility to other disease: Secondary | ICD-10-CM

## 2022-06-04 DIAGNOSIS — Z1501 Genetic susceptibility to malignant neoplasm of breast: Secondary | ICD-10-CM | POA: Diagnosis not present

## 2022-06-04 DIAGNOSIS — Z800 Family history of malignant neoplasm of digestive organs: Secondary | ICD-10-CM

## 2022-06-04 DIAGNOSIS — Z1509 Genetic susceptibility to other malignant neoplasm: Secondary | ICD-10-CM

## 2022-06-04 MED ORDER — NA SULFATE-K SULFATE-MG SULF 17.5-3.13-1.6 GM/177ML PO SOLN: 1.0000 | Freq: Once | ORAL | 0 refills | Status: AC

## 2022-06-04 NOTE — Progress Notes (Signed)
HPI : Elizabeth Contreras is a very pleasant 42 year old female with a history of migraines and asthma who is referred to [place] by Dr. 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  She has a family history of ovarian cancer in maternal aunt, breast cancer in maternal great grandmother and colon cancer in paternal uncle.  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  Both of her parents had had colonoscopies, and she thinks her father may have had polyps.  The patient denies any chronic GI symptoms to include abdominal pain, constipation, diarrhea or blood in her stool.  She denies any upper GI symptoms such as frequent heartburn/acid regurgitation, dysphagia or dyspepsia.  She does have frequent bouts of nausea which are associated with migraines and anxiety. Her weight has been stable.   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    Thyroid disease      Past Surgical History:  Procedure Laterality Date   COLPOSCOPY     NASAL SINUS SURGERY  07/2015   SHOULDER ARTHROSCOPY WITH BICEPS TENDON REPAIR Left 07/21/2019   Procedure: LEFT SHOULDER ARTHROSCOPY WITH BICEPS TENODESIS WITH DEBRIDEMENT OF LABRUM;  Surgeon: Tarry Kos, MD;  Location: [facility];  Service: Orthopedics;  Laterality: Left;   Family History  Problem Relation Age of Onset   Migraines Mother    Aneurysm Mother    Ovarian cancer Maternal Aunt        dx 85s-50s   Colon cancer Paternal Uncle        dx 48s   Lung cancer Maternal Grandfather    Breast cancer Other        MGGM   Social History   Tobacco Use   Smoking status: Never   Smokeless tobacco: Never  Vaping Use    Vaping Use: Never used  Substance Use Topics   Alcohol use: Yes    Comment: rarely   Drug use: No   Current Outpatient Medications  Medication Sig Dispense Refill   acetaminophen (TYLENOL) 325 MG tablet Take 650 mg by mouth every 6 (six) hours as needed.     ALPRAZolam (XANAX) 0.25 MG tablet Take 1 tablet (0.25 mg total) by mouth 2 (two) times daily as needed for anxiety. 20 tablet 0   Ashwagandha 500 MG CAPS      cetirizine (ZYRTEC) 10 MG tablet Take 1 tablet (10 mg total) by mouth daily. 30 tablet 11   Cholecalciferol (VITAMIN D) 2000 units CAPS Take by mouth.     cyclobenzaprine (FLEXERIL) 5 MG tablet TAKE 1 TABLET BY MOUTH THREE TIMES A DAY AS NEEDED FOR MUSCLE SPASMS 30 tablet 1   Galcanezumab-gnlm (EMGALITY) 120 MG/ML SOAJ Inject 2 Pens into the skin every 30 (thirty) days. 2.24 mL 0   Galcanezumab-gnlm (EMGALITY) 120 MG/ML SOAJ Inject 1 Pen into the skin every 30 (thirty) days. 1.12 mL 8   ibuprofen (ADVIL,MOTRIN) 200 MG tablet Take 200 mg by mouth every 6 (six) hours as needed.     Inositol-D Chiro-Inositol 2000-50 MG PACK      Krill Oil (OMEGA-3) 500 MG CAPS  Magnesium 500 MG TABS      ondansetron (ZOFRAN) 4 MG tablet Take 1 tablet (4 mg total) by mouth every 8 (eight) hours as needed for nausea or vomiting. 20 tablet 0   PROAIR HFA 108 (90 Base) MCG/ACT inhaler INHALE 2 PUFFS EVERY 6 HOURS AS NEEDED FOR WHEEZE OR SHORTNESS OF BREATH 8.5 Inhaler 1   Probiotic Product (PROBIOTIC PO)      Ubrogepant (UBRELVY) 100 MG TABS Take 1 tablet (100 mg total) by mouth as needed (for migraine). May repeat a dose in 2 hours if needed. Max dose 2 pills in 24 hours 16 tablet 6   valACYclovir (VALTREX) 500 MG tablet Take 1 tablet (500 mg total) by mouth daily. 90 tablet 3   No current facility-administered medications for this visit.   Allergies  Allergen Reactions   Topamax [Topiramate]     Cognitive slowing     Review of Systems: All systems reviewed and negative except where  noted in HPI.    CT ANGIO HEAD W OR WO CONTRAST  Result Date: 05/27/2022 CLINICAL DATA:  Cerebral aneurysm screening, high risk. EXAM: CT HEAD WITHOUT CONTRAST CT ANGIOGRAPHY OF THE HEAD TECHNIQUE: Contiguous axial images were obtained from the base of the skull through the vertex without intravenous contrast. Multidetector CT imaging of the head was performed using the standard protocol during bolus administration of intravenous contrast. 3D post processing including multiplanar CT image reconstructions and MIPs were obtained to evaluate the vascular anatomy. RADIATION DOSE REDUCTION: This exam was performed according to the departmental dose-optimization program which includes automated exposure control, adjustment of the mA and/or kV according to patient size and/or use of iterative reconstruction technique. CONTRAST:  75mL ISOVUE-370 IOPAMIDOL (ISOVUE-370) INJECTION 76% COMPARISON:  Head CT 04/18/2022. FINDINGS: CT HEAD Brain: No acute intracranial hemorrhage. Gray-white differentiation is preserved. No hydrocephalus or extra-axial collection. No mass effect or midline shift. Vascular: No hyperdense vessel or unexpected calcification. Skull: No calvarial fracture or suspicious bone lesion. Skull base is unremarkable. Sinuses/Orbits: Unremarkable. CTA HEAD Anterior circulation: Intracranial ICAs are patent without stenosis or aneurysm. The proximal ACAs and MCAs are patent without stenosis or aneurysm. Distal branches are symmetric. Posterior circulation: Visualized portions of the distal vertebral arteries and basilar artery are patent without stenosis or aneurysm. The SCAs, AICAs and PICAs are patent proximally. The PCAs are patent proximally without stenosis or aneurysm. Distal branches are symmetric. Venous sinuses: Patent. Anatomic variants: None. IMPRESSION: No acute intracranial abnormality or aneurysm. Normal CTA of the head. Electronically Signed   By: Orvan Falconer M.D.   On: 05/27/2022 09:05     Physical Exam: BP (!) 90/50 (BP Location: Left Arm, Patient Position: Sitting, Cuff Size: Normal)   Pulse 72   Ht 5' 5.5" (1.664 m) Comment: height measured without shoes  Wt 144 lb 8 oz (65.5 kg)   LMP 05/15/2022   BMI 23.68 kg/m  Constitutional: Pleasant,well-developed, Caucasian female in no acute distress. HEENT: Normocephalic and atraumatic. Conjunctivae are normal. No scleral icterus. Neck supple.  Cardiovascular: Normal rate, regular rhythm.  Pulmonary/chest: Effort normal and breath sounds normal. No wheezing, rales or rhonchi. Abdominal: Soft, nondistended, mild tenderness to palpation in the bilateral lower quadrants without rigidity or guarding. Bowel sounds active throughout. There are no masses palpable. No hepatomegaly. Extremities: no edema Neurological: Alert and oriented to person place and time. Skin: Skin is warm and dry. No rashes noted. Psychiatric: Normal mood and affect. Behavior is normal.  CBC    Component Value Date/Time  WBC 6.4 10/22/2021 0934   RBC 4.32 10/22/2021 0934   HGB 13.4 10/22/2021 0934   HCT 39.9 10/22/2021 0934   PLT 183.0 10/22/2021 0934   MCV 92.3 10/22/2021 0934   MCH 31.9 11/19/2019 1023   MCHC 33.7 10/22/2021 0934   RDW 12.5 10/22/2021 0934   LYMPHSABS 2.3 10/22/2021 0934   MONOABS 0.4 10/22/2021 0934   EOSABS 0.3 10/22/2021 0934   BASOSABS 0.1 10/22/2021 0934    CMP     Component Value Date/Time   NA 139 10/22/2021 0934   NA 140 10/19/2018 1059   K 4.1 10/22/2021 0934   CL 103 10/22/2021 0934   CO2 28 10/22/2021 0934   GLUCOSE 85 10/22/2021 0934   BUN 14 10/22/2021 0934   BUN 12 10/19/2018 1059   CREATININE 0.75 10/22/2021 0934   CREATININE 0.54 11/19/2019 1023   CALCIUM 8.9 10/22/2021 0934   PROT 7.4 10/22/2021 0934   PROT 7.3 10/19/2018 1059   ALBUMIN 4.2 10/22/2021 0934   ALBUMIN 4.6 10/19/2018 1059   AST 13 10/22/2021 0934   ALT 9 10/22/2021 0934   ALKPHOS 46 10/22/2021 0934   BILITOT 0.6 10/22/2021 0934    BILITOT 0.4 10/19/2018 1059   GFRNONAA 119 11/19/2019 1023   GFRAA 138 11/19/2019 1023     ASSESSMENT AND PLAN:  42 year old female with recently diagnosed CHEK2 mutation and second-degree relative (paternal uncle) with colon cancer without any chronic GI symptoms.  Although the certainty behind the colon cancer risk with CHEK2 mutations is not clear, NCCN recommends initiating early colon cancer screening at age 2 and performing screening colonoscopies every 5 years.  I discussed with the patient that it is possible that if her first few colonoscopies are normal with no polyps, we may consider extending this interval, but for now would recommend adhering to those recommendations.  CHEK2 mutation/family history colon cancer - Colonoscopy now and every 5 years  The details, risks (including bleeding, perforation, infection, missed lesions, medication reactions and possible hospitalization or surgery if complications occur), benefits, and alternatives to colonoscopy with possible biopsy and possible polypectomy were discussed with the patient and she consents to proceed.   Jayro Mcmath E. Tomasa Rand, MD [facility]   CC:  Deeann Saint, MD

## 2022-06-04 NOTE — Patient Instructions (Addendum)
_______________________________________________________  If your blood pressure at your visit was 140/90 or greater, please contact your primary care physician to follow up on this.  _______________________________________________________  If you are age 42 or older, your body mass index should be between 23-30. Your Body mass index is 23.68 kg/m. If this is out of the aforementioned range listed, please consider follow up with your Primary Care Provider.  If you are age 68 or younger, your body mass index should be between 19-25. Your Body mass index is 23.68 kg/m. If this is out of the aformentioned range listed, please consider follow up with your Primary Care Provider.   You have been scheduled for a colonoscopy. Please follow written instructions given to you at your visit today.  Please pick up your prep supplies at the pharmacy within the next 1-3 days. If you use inhalers (even only as needed), please bring them with you on the day of your procedure.   The [facility] providers would like to encourage you to use [facility] to communicate with providers for non-urgent requests or questions.  Due to long hold times on the telephone, sending your provider a message by [facility] may be a faster and more efficient way to get a response.  Please allow 48 business hours for a response.  Please remember that this is for non-urgent requests.   It was a pleasure to see you today!  Thank you for trusting me with your gastrointestinal care!    Scott E.Tomasa Rand, MD

## 2022-06-05 ENCOUNTER — Ambulatory Visit
Admission: RE | Admit: 2022-06-05 | Discharge: 2022-06-05 | Disposition: A | Discharge status: Home / Self Care | Payer: BC Managed Care – PPO | Source: Ambulatory Visit | Attending: Obstetrics and Gynecology | Admitting: Obstetrics and Gynecology

## 2022-06-05 ENCOUNTER — Other Ambulatory Visit: Payer: Self-pay | Admitting: Obstetrics and Gynecology

## 2022-06-05 DIAGNOSIS — R928 Other abnormal and inconclusive findings on diagnostic imaging of breast: Secondary | ICD-10-CM

## 2022-06-05 DIAGNOSIS — N6310 Unspecified lump in the right breast, unspecified quadrant: Secondary | ICD-10-CM

## 2022-06-07 DIAGNOSIS — F4311 Post-traumatic stress disorder, acute: Secondary | ICD-10-CM | POA: Diagnosis not present

## 2022-06-18 ENCOUNTER — Encounter: Payer: Self-pay | Admitting: Gastroenterology

## 2022-06-21 DIAGNOSIS — F4311 Post-traumatic stress disorder, acute: Secondary | ICD-10-CM | POA: Diagnosis not present

## 2022-06-25 DIAGNOSIS — M26619 Adhesions and ankylosis of temporomandibular joint, unspecified side: Secondary | ICD-10-CM | POA: Diagnosis not present

## 2022-06-25 DIAGNOSIS — R519 Headache, unspecified: Secondary | ICD-10-CM | POA: Diagnosis not present

## 2022-06-25 DIAGNOSIS — M9902 Segmental and somatic dysfunction of thoracic region: Secondary | ICD-10-CM | POA: Diagnosis not present

## 2022-06-25 DIAGNOSIS — M9901 Segmental and somatic dysfunction of cervical region: Secondary | ICD-10-CM | POA: Diagnosis not present

## 2022-06-27 ENCOUNTER — Other Ambulatory Visit: Payer: Self-pay | Admitting: Gastroenterology

## 2022-06-27 ENCOUNTER — Encounter: Payer: Self-pay | Admitting: Gastroenterology

## 2022-06-27 ENCOUNTER — Ambulatory Visit (AMBULATORY_SURGERY_CENTER): Payer: BC Managed Care – PPO | Admitting: Gastroenterology

## 2022-06-27 VITALS — BP 96/51 | HR 67 | Temp 98.0°F | Resp 13 | Ht 65.0 in | Wt 144.0 lb

## 2022-06-27 DIAGNOSIS — K635 Polyp of colon: Secondary | ICD-10-CM | POA: Diagnosis not present

## 2022-06-27 DIAGNOSIS — D123 Benign neoplasm of transverse colon: Secondary | ICD-10-CM

## 2022-06-27 DIAGNOSIS — D124 Benign neoplasm of descending colon: Secondary | ICD-10-CM

## 2022-06-27 DIAGNOSIS — Z800 Family history of malignant neoplasm of digestive organs: Secondary | ICD-10-CM | POA: Diagnosis not present

## 2022-06-27 DIAGNOSIS — Z1211 Encounter for screening for malignant neoplasm of colon: Secondary | ICD-10-CM | POA: Diagnosis not present

## 2022-06-27 MED ORDER — SODIUM CHLORIDE 0.9 % IV SOLN
500.0000 mL | Freq: Once | INTRAVENOUS | Status: DC
Start: 2022-06-27 — End: 2022-06-27

## 2022-06-27 NOTE — Progress Notes (Signed)
History and Physical Interval Note:  06/27/2022 3:38 PM  Elizabeth Contreras  has presented today for endoscopic procedure(s), with the diagnosis of  Encounter Diagnosis  Name Primary?   Family history of colon cancer Yes  .  The various methods of evaluation and treatment have been discussed with the patient and/or family. After consideration of risks, benefits and other options for treatment, the patient has consented to  the endoscopic procedure(s).   The patient's history has been reviewed, patient examined, no change in status, stable for endoscopic procedure(s).  I have reviewed the patient's chart and labs.  Questions were answered to the patient's satisfaction.     Kainoah Bartosiewicz E. Tomasa Rand, MD [facility]

## 2022-06-27 NOTE — Progress Notes (Signed)
Pt resting comfortably. VSS. Airway intact. SBAR complete to RN. All questions answered.   

## 2022-06-27 NOTE — Patient Instructions (Signed)
YOU HAD AN ENDOSCOPIC PROCEDURE TODAY AT THE [facility]:   Refer to the procedure report that was given to you for any specific questions about what was found during the examination.  If the procedure report does not answer your questions, please call your gastroenterologist to clarify.  If you requested that your care partner not be given the details of your procedure findings, then the procedure report has been included in a sealed envelope for you to review at your convenience later.  **handout given on polyps**  YOU SHOULD EXPECT: Some feelings of bloating in the abdomen. Passage of more gas than usual.  Walking can help get rid of the air that was put into your GI tract during the procedure and reduce the bloating. If you had a lower endoscopy (such as a colonoscopy or flexible sigmoidoscopy) you may notice spotting of blood in your stool or on the toilet paper. If you underwent a bowel prep for your procedure, you may not have a normal bowel movement for a few days.  Please Note:  You might notice some irritation and congestion in your nose or some drainage.  This is from the oxygen used during your procedure.  There is no need for concern and it should clear up in a day or so.  SYMPTOMS TO REPORT IMMEDIATELY:  Following lower endoscopy (colonoscopy or flexible sigmoidoscopy):  Excessive amounts of blood in the stool  Significant tenderness or worsening of abdominal pains  Swelling of the abdomen that is new, acute  Fever of 100F or higher  For urgent or emergent issues, a gastroenterologist can be reached at any hour by calling [phone]. Do not use MyChart messaging for urgent concerns.    DIET:  We do recommend a small meal at first, but then you may proceed to your regular diet.  Drink plenty of fluids but you should avoid alcoholic beverages for 24 hours.  ACTIVITY:  You should plan to take it easy for the rest of today and you should NOT DRIVE or use heavy  machinery until tomorrow (because of the sedation medicines used during the test).    FOLLOW UP: Our staff will call the number listed on your records the next business day following your procedure.  We will call around 7:15- 8:00 am to check on you and address any questions or concerns that you may have regarding the information given to you following your procedure. If we do not reach you, we will leave a message.     If any biopsies were taken you will be contacted by phone or by letter within the next 1-3 weeks.  Please call us at [phone] if you have not heard about the biopsies in 3 weeks.    SIGNATURES/CONFIDENTIALITY: You and/or your care partner have signed paperwork which will be entered into your electronic medical record.  These signatures attest to the fact that that the information above on your After Visit Summary has been reviewed and is understood.  Full responsibility of the confidentiality of this discharge information lies with you and/or your care-partner.

## 2022-06-27 NOTE — Op Note (Signed)
[facility] Patient Name: Elizabeth Contreras Procedure Date: 06/27/2022 3:48 PM MRN: [id number] Endoscopist: Lorin Picket E. Tomasa Rand , MD, [id number] Age: 42 Referring MD:  Date of Birth: 09/07/1980 Gender: Female Account #: [id number] Procedure:                Colonoscopy Indications:              Colon cancer screening in patient at increased                            risk: Personal history of CHEK2 mutation; second                            degree relative (uncle) with colon cancer Medicines:                Monitored Anesthesia Care Procedure:                Pre-Anesthesia Assessment:                           - Prior to the procedure, a History and Physical                            was performed, and patient medications and                            allergies were reviewed. The patient's tolerance of                            previous anesthesia was also reviewed. The risks                            and benefits of the procedure and the sedation                            options and risks were discussed with the patient.                            All questions were answered, and informed consent                            was obtained. Prior Anticoagulants: The patient has                            taken no anticoagulant or antiplatelet agents. ASA                            Grade Assessment: II - A patient with mild systemic                            disease. After reviewing the risks and benefits,                            the patient was deemed in satisfactory condition to  undergo the procedure.                           After obtaining informed consent, the colonoscope                            was passed under direct vision. Throughout the                            procedure, the patient's blood pressure, pulse, and                            oxygen saturations were monitored continuously. The                            Olympus  CF-HQ190L ([id number]) Colonoscope was                            introduced through the anus and advanced to the the                            terminal ileum, with identification of the                            appendiceal orifice and IC valve. The colonoscopy                            was performed without difficulty. The patient                            tolerated the procedure well. The quality of the                            bowel preparation was good. The terminal ileum,                            ileocecal valve, appendiceal orifice, and rectum                            were photographed. The bowel preparation used was                            SUPREP via split dose instruction. Scope In: 3:54:58 PM Scope Out: 4:21:57 PM Scope Withdrawal Time: 0 hours 18 minutes 21 seconds  Total Procedure Duration: 0 hours 26 minutes 59 seconds  Findings:                 The perianal and digital rectal examinations were                            normal. Pertinent negatives include normal                            sphincter tone and no palpable rectal lesions.  A 3 mm polyp was found in the transverse colon. The                            polyp was sessile. The polyp was removed with a                            cold snare. Resection and retrieval were complete.                            Estimated blood loss was minimal.                           A 7 mm polyp was found in the descending colon. The                            polyp was sessile. The polyp was removed with a                            cold snare. Resection and retrieval were complete.                            Estimated blood loss was minimal.                           The exam was otherwise normal throughout the                            examined colon.                           The terminal ileum appeared normal.                           The retroflexed view of the distal rectum and anal                             verge was normal and showed no anal or rectal                            abnormalities. Complications:            No immediate complications. Estimated Blood Loss:     Estimated blood loss was minimal. Impression:               - One 3 mm polyp in the transverse colon, removed                            with a cold snare. Resected and retrieved.                           - One 7 mm polyp in the descending colon, removed                            with a cold snare. Resected and  retrieved.                           - The examined portion of the ileum was normal.                           - The distal rectum and anal verge are normal on                            retroflexion view. Recommendation:           - Patient has a contact number available for                            emergencies. The signs and symptoms of potential                            delayed complications were discussed with the                            patient. Return to normal activities tomorrow.                            Written discharge instructions were provided to the                            patient.                           - Resume previous diet.                           - Continue present medications.                           - Await pathology results.                           - Repeat colonoscopy in 5 years for surveillance. Phil Corti E. Tomasa Rand, MD 06/27/2022 4:31:29 PM This report has been signed electronically.

## 2022-06-27 NOTE — Progress Notes (Signed)
Called to room to assist during endoscopic procedure.  Patient ID and intended procedure confirmed with present staff. Received instructions for my participation in the procedure from the performing physician.  

## 2022-06-28 ENCOUNTER — Telehealth: Payer: Self-pay | Admitting: *Deleted

## 2022-06-28 DIAGNOSIS — F4311 Post-traumatic stress disorder, acute: Secondary | ICD-10-CM | POA: Diagnosis not present

## 2022-06-28 NOTE — Telephone Encounter (Signed)
Follow up call attempt.  LVM to call if any questions or concerns. 

## 2022-07-03 DIAGNOSIS — F4311 Post-traumatic stress disorder, acute: Secondary | ICD-10-CM | POA: Diagnosis not present

## 2022-07-08 NOTE — Progress Notes (Signed)
Elizabeth Contreras,  One polyp which I removed during your recent procedure was proven to be completely benign but is considered a "pre-cancerous" polyp that MAY have grown into cancer if it had not been removed.  The other polyp was not precancerous.  Studies shows that at least 20% of women over age 42 and 30% of men over age 66 have pre-cancerous polyps.  As previously discussed, because of your CHEK2 mutation, I recommend that you have a repeat colonoscopy in 5 years.   If you develop any new rectal bleeding, abdominal pain or significant bowel habit changes, please contact me before then.

## 2022-07-12 DIAGNOSIS — F4311 Post-traumatic stress disorder, acute: Secondary | ICD-10-CM | POA: Diagnosis not present

## 2022-07-26 DIAGNOSIS — F4311 Post-traumatic stress disorder, acute: Secondary | ICD-10-CM | POA: Diagnosis not present

## 2022-07-30 DIAGNOSIS — R519 Headache, unspecified: Secondary | ICD-10-CM | POA: Diagnosis not present

## 2022-07-30 DIAGNOSIS — M9902 Segmental and somatic dysfunction of thoracic region: Secondary | ICD-10-CM | POA: Diagnosis not present

## 2022-07-30 DIAGNOSIS — M9901 Segmental and somatic dysfunction of cervical region: Secondary | ICD-10-CM | POA: Diagnosis not present

## 2022-07-30 DIAGNOSIS — M26619 Adhesions and ankylosis of temporomandibular joint, unspecified side: Secondary | ICD-10-CM | POA: Diagnosis not present

## 2022-08-02 ENCOUNTER — Encounter: Payer: Self-pay | Admitting: Family Medicine

## 2022-08-02 ENCOUNTER — Ambulatory Visit: Payer: BC Managed Care – PPO | Admitting: Family Medicine

## 2022-08-02 VITALS — BP 98/68 | HR 75 | Temp 97.9°F | Wt 142.6 lb

## 2022-08-02 DIAGNOSIS — R519 Headache, unspecified: Secondary | ICD-10-CM

## 2022-08-02 DIAGNOSIS — T63461A Toxic effect of venom of wasps, accidental (unintentional), initial encounter: Secondary | ICD-10-CM

## 2022-08-02 DIAGNOSIS — R200 Anesthesia of skin: Secondary | ICD-10-CM

## 2022-08-02 DIAGNOSIS — R600 Localized edema: Secondary | ICD-10-CM | POA: Diagnosis not present

## 2022-08-02 DIAGNOSIS — M26629 Arthralgia of temporomandibular joint, unspecified side: Secondary | ICD-10-CM

## 2022-08-02 MED ORDER — PREDNISONE 10 MG PO TABS
ORAL_TABLET | ORAL | 0 refills | Status: DC
Start: 2022-08-02 — End: 2023-05-09

## 2022-08-02 NOTE — Patient Instructions (Addendum)
[facility] [address], [place], [place] [address] Phone: [phone]  [email or web address] This is the website for the other dental office in the [place] area that does Botox for TMJ.  I think they changed the name of their practices recently.

## 2022-08-02 NOTE — Progress Notes (Signed)
Established Patient Office Visit   Subjective  Patient ID: Elizabeth Contreras, female    DOB: Dec 22, 1980  Age: 42 y.o. MRN: [id number]  Chief Complaint  Patient presents with   Jaw Pain    Left side pain for about a week, some times during the day will feel numbness in left side of lips. Also has a bump on inside of jaw line, but has gone down some. Tried ice. Made it worse, heat helped a little. Advil for pain, also tried biofreeze, it helped a little.     Patient is a 42 year old female who presents for acute concern.  Patient states she was stung by a wasp 3 days ago on her right thumb.  The next day patient developed left-sided facial edema that is since progressed into pain, numbness, and a bump on her jawline.  Pain/discomfort constant, worse at the end of the day.  Patient tried ice which made symptoms worse, Biofreeze, heat, and Aleve.  Patient with history of TMJ.  Has clicking and popping on right, with pain on the left.  Pain typically occurs throughout the day.  Pain is not present for Singh in the morning.  Patient does not recall grinding her teeth at night.  In the past and mouthguard was suggested.    Past Medical History:  Diagnosis Date   Anxiety    Asthma    Common migraine with intractable migraine 06/20/2016   Herpes    Migraine without aura    PCOS (polycystic ovarian syndrome)    PONV (postoperative nausea and vomiting)    PTSD (post-traumatic stress disorder) 2015   Sinusitis    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    Thyroid disease    Past Surgical History:  Procedure Laterality Date   COLPOSCOPY     NASAL SINUS SURGERY  07/2015   SHOULDER ARTHROSCOPY WITH BICEPS TENDON REPAIR Left 07/21/2019   Procedure: LEFT SHOULDER ARTHROSCOPY WITH BICEPS TENODESIS WITH DEBRIDEMENT OF LABRUM;  Surgeon: Tarry Kos, MD;  Location: [facility];  Service: Orthopedics;  Laterality: Left;   TONSILLECTOMY     Social History   Tobacco Use    Smoking status: Never   Smokeless tobacco: Never  Vaping Use   Vaping Use: Never used  Substance Use Topics   Alcohol use: Yes    Comment: rarely   Drug use: No   Family History  Problem Relation Age of Onset   Migraines Mother    Aneurysm Mother        Brain   Parkinson's disease Mother    Hyperlipidemia Father    Migraines Sister    Ovarian cancer Maternal Aunt        dx 8s-50s   Colon cancer Paternal Uncle        dx 45s   Lung cancer Maternal Grandfather    Heart disease Paternal Grandmother    Heart disease Paternal Grandfather    COPD Paternal Grandfather    Breast cancer Other        MGGM   Esophageal cancer Neg Hx    Stomach cancer Neg Hx    Rectal cancer Neg Hx    Allergies  Allergen Reactions   Topamax [Topiramate]     Cognitive slowing      ROS Negative unless stated above    Objective:     BP 98/68 (BP Location: Left Arm, Patient Position: Sitting, Cuff Size: Normal)   Pulse 75   Temp  97.9 F (36.6 C) (Oral)   Wt 142 lb 9.6 oz (64.7 kg)   SpO2 96%   BMI 23.73 kg/m    Physical Exam Constitutional:      Appearance: Normal appearance.  HENT:     Head: Normocephalic and atraumatic.     Right Ear: Tympanic membrane, ear canal and external ear normal. There is no impacted cerumen.     Left Ear: Tympanic membrane, ear canal and external ear normal. There is no impacted cerumen.     Nose: Nose normal.     Mouth/Throat:     Mouth: Mucous membranes are moist.  Eyes:     Extraocular Movements: Extraocular movements intact.     Conjunctiva/sclera: Conjunctivae normal.  Cardiovascular:     Rate and Rhythm: Normal rate and regular rhythm.  Pulmonary:     Effort: Pulmonary effort is normal.  Musculoskeletal:     Comments: Right TMJ joint popping with opening and closing of mouth.  Skin:    General: Skin is warm and dry.     Comments: No facial asymmetry.  Dorsum of right thumb with erythematous papule with central punctum.  Neurological:      Mental Status: She is alert and oriented to person, place, and time. Mental status is at baseline.     Comments: Slightly increased sensation to soft and sharp touch on left maxillary area of face.      No results found for any visits on 08/02/22.    Assessment & Plan:  Left facial numbness -     predniSONE; Take 5 tabs on day 1, 4 tabs on day 2, 3 tabs on day 3, 2 tabs on day 4, 1 tab on day 5.  Dispense: 15 tablet; Refill: 0  Facial edema -     predniSONE; Take 5 tabs on day 1, 4 tabs on day 2, 3 tabs on day 3, 2 tabs on day 4, 1 tab on day 5.  Dispense: 15 tablet; Refill: 0  Acute facial pain -     predniSONE; Take 5 tabs on day 1, 4 tabs on day 2, 3 tabs on day 3, 2 tabs on day 4, 1 tab on day 5.  Dispense: 15 tablet; Refill: 0  Wasp sting, accidental or unintentional, initial encounter -     predniSONE; Take 5 tabs on day 1, 4 tabs on day 2, 3 tabs on day 3, 2 tabs on day 4, 1 tab on day 5.  Dispense: 15 tablet; Refill: 0  TMJ syndrome  New problem.  Concern for reaction to wasp sting causing paresthesias, edema, and facial pain.  Edema now resolved.  Discussed Benadryl and prednisone taper.  Given strict precautions.  Given info for dental providers that provide Botox for TMJ dysfunction.  Return if symptoms worsen or fail to improve.   Deeann Saint, MD

## 2022-08-06 DIAGNOSIS — F4311 Post-traumatic stress disorder, acute: Secondary | ICD-10-CM | POA: Diagnosis not present

## 2022-08-16 DIAGNOSIS — F4311 Post-traumatic stress disorder, acute: Secondary | ICD-10-CM | POA: Diagnosis not present

## 2022-08-30 DIAGNOSIS — F4311 Post-traumatic stress disorder, acute: Secondary | ICD-10-CM | POA: Diagnosis not present

## 2022-09-04 DIAGNOSIS — R519 Headache, unspecified: Secondary | ICD-10-CM | POA: Diagnosis not present

## 2022-09-04 DIAGNOSIS — M9902 Segmental and somatic dysfunction of thoracic region: Secondary | ICD-10-CM | POA: Diagnosis not present

## 2022-09-04 DIAGNOSIS — M9901 Segmental and somatic dysfunction of cervical region: Secondary | ICD-10-CM | POA: Diagnosis not present

## 2022-09-04 DIAGNOSIS — M26619 Adhesions and ankylosis of temporomandibular joint, unspecified side: Secondary | ICD-10-CM | POA: Diagnosis not present

## 2022-09-10 DIAGNOSIS — M26619 Adhesions and ankylosis of temporomandibular joint, unspecified side: Secondary | ICD-10-CM | POA: Diagnosis not present

## 2022-09-10 DIAGNOSIS — M9902 Segmental and somatic dysfunction of thoracic region: Secondary | ICD-10-CM | POA: Diagnosis not present

## 2022-09-10 DIAGNOSIS — M9901 Segmental and somatic dysfunction of cervical region: Secondary | ICD-10-CM | POA: Diagnosis not present

## 2022-09-10 DIAGNOSIS — R519 Headache, unspecified: Secondary | ICD-10-CM | POA: Diagnosis not present

## 2022-09-13 DIAGNOSIS — F4311 Post-traumatic stress disorder, acute: Secondary | ICD-10-CM | POA: Diagnosis not present

## 2022-09-16 DIAGNOSIS — M7911 Myalgia of mastication muscle: Secondary | ICD-10-CM | POA: Diagnosis not present

## 2022-09-16 DIAGNOSIS — M779 Enthesopathy, unspecified: Secondary | ICD-10-CM | POA: Diagnosis not present

## 2022-09-16 DIAGNOSIS — M26633 Articular disc disorder of bilateral temporomandibular joint: Secondary | ICD-10-CM | POA: Diagnosis not present

## 2022-09-17 DIAGNOSIS — M9901 Segmental and somatic dysfunction of cervical region: Secondary | ICD-10-CM | POA: Diagnosis not present

## 2022-09-17 DIAGNOSIS — R519 Headache, unspecified: Secondary | ICD-10-CM | POA: Diagnosis not present

## 2022-09-17 DIAGNOSIS — M9902 Segmental and somatic dysfunction of thoracic region: Secondary | ICD-10-CM | POA: Diagnosis not present

## 2022-09-17 DIAGNOSIS — M26619 Adhesions and ankylosis of temporomandibular joint, unspecified side: Secondary | ICD-10-CM | POA: Diagnosis not present

## 2022-09-18 ENCOUNTER — Other Ambulatory Visit: Payer: Self-pay | Admitting: Dentistry

## 2022-09-18 DIAGNOSIS — M26633 Articular disc disorder of bilateral temporomandibular joint: Secondary | ICD-10-CM

## 2022-09-20 DIAGNOSIS — F4311 Post-traumatic stress disorder, acute: Secondary | ICD-10-CM | POA: Diagnosis not present

## 2022-09-21 ENCOUNTER — Ambulatory Visit
Admission: RE | Admit: 2022-09-21 | Discharge: 2022-09-21 | Disposition: A | Discharge status: Home / Self Care | Payer: BC Managed Care – PPO | Source: Ambulatory Visit | Attending: Dentistry | Admitting: Dentistry

## 2022-09-21 DIAGNOSIS — M26633 Articular disc disorder of bilateral temporomandibular joint: Secondary | ICD-10-CM

## 2022-09-21 DIAGNOSIS — R6884 Jaw pain: Secondary | ICD-10-CM | POA: Diagnosis not present

## 2022-09-23 DIAGNOSIS — M26619 Adhesions and ankylosis of temporomandibular joint, unspecified side: Secondary | ICD-10-CM | POA: Diagnosis not present

## 2022-09-23 DIAGNOSIS — M9901 Segmental and somatic dysfunction of cervical region: Secondary | ICD-10-CM | POA: Diagnosis not present

## 2022-09-23 DIAGNOSIS — M9902 Segmental and somatic dysfunction of thoracic region: Secondary | ICD-10-CM | POA: Diagnosis not present

## 2022-09-23 DIAGNOSIS — R519 Headache, unspecified: Secondary | ICD-10-CM | POA: Diagnosis not present

## 2022-09-27 DIAGNOSIS — F4311 Post-traumatic stress disorder, acute: Secondary | ICD-10-CM | POA: Diagnosis not present

## 2022-09-30 DIAGNOSIS — M9902 Segmental and somatic dysfunction of thoracic region: Secondary | ICD-10-CM | POA: Diagnosis not present

## 2022-09-30 DIAGNOSIS — R519 Headache, unspecified: Secondary | ICD-10-CM | POA: Diagnosis not present

## 2022-09-30 DIAGNOSIS — M9901 Segmental and somatic dysfunction of cervical region: Secondary | ICD-10-CM | POA: Diagnosis not present

## 2022-09-30 DIAGNOSIS — M26619 Adhesions and ankylosis of temporomandibular joint, unspecified side: Secondary | ICD-10-CM | POA: Diagnosis not present

## 2022-10-10 DIAGNOSIS — M9901 Segmental and somatic dysfunction of cervical region: Secondary | ICD-10-CM | POA: Diagnosis not present

## 2022-10-10 DIAGNOSIS — R519 Headache, unspecified: Secondary | ICD-10-CM | POA: Diagnosis not present

## 2022-10-10 DIAGNOSIS — M26619 Adhesions and ankylosis of temporomandibular joint, unspecified side: Secondary | ICD-10-CM | POA: Diagnosis not present

## 2022-10-10 DIAGNOSIS — M9902 Segmental and somatic dysfunction of thoracic region: Secondary | ICD-10-CM | POA: Diagnosis not present

## 2022-10-11 DIAGNOSIS — F4311 Post-traumatic stress disorder, acute: Secondary | ICD-10-CM | POA: Diagnosis not present

## 2022-10-14 ENCOUNTER — Encounter: Payer: Self-pay | Admitting: Obstetrics and Gynecology

## 2022-10-14 DIAGNOSIS — R519 Headache, unspecified: Secondary | ICD-10-CM | POA: Diagnosis not present

## 2022-10-14 DIAGNOSIS — M9901 Segmental and somatic dysfunction of cervical region: Secondary | ICD-10-CM | POA: Diagnosis not present

## 2022-10-14 DIAGNOSIS — M9902 Segmental and somatic dysfunction of thoracic region: Secondary | ICD-10-CM | POA: Diagnosis not present

## 2022-10-14 DIAGNOSIS — M26619 Adhesions and ankylosis of temporomandibular joint, unspecified side: Secondary | ICD-10-CM | POA: Diagnosis not present

## 2022-10-18 DIAGNOSIS — F4311 Post-traumatic stress disorder, acute: Secondary | ICD-10-CM | POA: Diagnosis not present

## 2022-10-21 DIAGNOSIS — R519 Headache, unspecified: Secondary | ICD-10-CM | POA: Diagnosis not present

## 2022-10-21 DIAGNOSIS — M26619 Adhesions and ankylosis of temporomandibular joint, unspecified side: Secondary | ICD-10-CM | POA: Diagnosis not present

## 2022-10-21 DIAGNOSIS — M9902 Segmental and somatic dysfunction of thoracic region: Secondary | ICD-10-CM | POA: Diagnosis not present

## 2022-10-21 DIAGNOSIS — M9901 Segmental and somatic dysfunction of cervical region: Secondary | ICD-10-CM | POA: Diagnosis not present

## 2022-11-08 DIAGNOSIS — F4311 Post-traumatic stress disorder, acute: Secondary | ICD-10-CM | POA: Diagnosis not present

## 2022-11-11 DIAGNOSIS — M9901 Segmental and somatic dysfunction of cervical region: Secondary | ICD-10-CM | POA: Diagnosis not present

## 2022-11-11 DIAGNOSIS — M9902 Segmental and somatic dysfunction of thoracic region: Secondary | ICD-10-CM | POA: Diagnosis not present

## 2022-11-11 DIAGNOSIS — M26619 Adhesions and ankylosis of temporomandibular joint, unspecified side: Secondary | ICD-10-CM | POA: Diagnosis not present

## 2022-11-11 DIAGNOSIS — R519 Headache, unspecified: Secondary | ICD-10-CM | POA: Diagnosis not present

## 2022-11-15 DIAGNOSIS — F4311 Post-traumatic stress disorder, acute: Secondary | ICD-10-CM | POA: Diagnosis not present

## 2022-11-18 ENCOUNTER — Telehealth: Payer: Self-pay

## 2022-11-18 NOTE — Telephone Encounter (Signed)
FYI

## 2022-11-18 NOTE — Telephone Encounter (Signed)
*  [facility]  Prior Authorization form/request asks a question that requires your assistance. Please see the question below and advise accordingly.    Is patient currently taking Emgality? States on med list on 04/30 patient reported that they were not taking.   CMM Key: [id number]

## 2022-11-19 NOTE — Telephone Encounter (Signed)
Cancelling request on our end.

## 2022-11-21 DIAGNOSIS — L821 Other seborrheic keratosis: Secondary | ICD-10-CM | POA: Diagnosis not present

## 2022-11-21 DIAGNOSIS — D2372 Other benign neoplasm of skin of left lower limb, including hip: Secondary | ICD-10-CM | POA: Diagnosis not present

## 2022-11-21 DIAGNOSIS — L218 Other seborrheic dermatitis: Secondary | ICD-10-CM | POA: Diagnosis not present

## 2022-11-21 DIAGNOSIS — L814 Other melanin hyperpigmentation: Secondary | ICD-10-CM | POA: Diagnosis not present

## 2022-11-25 ENCOUNTER — Other Ambulatory Visit: Payer: Self-pay | Admitting: *Deleted

## 2022-11-25 DIAGNOSIS — G43119 Migraine with aura, intractable, without status migrainosus: Secondary | ICD-10-CM

## 2022-11-25 MED ORDER — UBRELVY 100 MG PO TABS: 100.0000 mg | ORAL_TABLET | ORAL | 0 refills | Status: DC | PRN

## 2022-11-26 DIAGNOSIS — M9901 Segmental and somatic dysfunction of cervical region: Secondary | ICD-10-CM | POA: Diagnosis not present

## 2022-11-26 DIAGNOSIS — M26619 Adhesions and ankylosis of temporomandibular joint, unspecified side: Secondary | ICD-10-CM | POA: Diagnosis not present

## 2022-11-26 DIAGNOSIS — M9902 Segmental and somatic dysfunction of thoracic region: Secondary | ICD-10-CM | POA: Diagnosis not present

## 2022-11-26 DIAGNOSIS — R519 Headache, unspecified: Secondary | ICD-10-CM | POA: Diagnosis not present

## 2022-11-29 DIAGNOSIS — F4311 Post-traumatic stress disorder, acute: Secondary | ICD-10-CM | POA: Diagnosis not present

## 2022-12-01 ENCOUNTER — Other Ambulatory Visit: Payer: Self-pay | Admitting: Neurology

## 2022-12-01 DIAGNOSIS — G43119 Migraine with aura, intractable, without status migrainosus: Secondary | ICD-10-CM

## 2022-12-06 DIAGNOSIS — F4311 Post-traumatic stress disorder, acute: Secondary | ICD-10-CM | POA: Diagnosis not present

## 2022-12-10 ENCOUNTER — Ambulatory Visit
Admission: RE | Admit: 2022-12-10 | Discharge: 2022-12-10 | Disposition: A | Discharge status: Home / Self Care | Payer: BC Managed Care – PPO | Source: Ambulatory Visit | Attending: Obstetrics and Gynecology

## 2022-12-10 ENCOUNTER — Ambulatory Visit
Admission: RE | Admit: 2022-12-10 | Discharge: 2022-12-10 | Disposition: A | Discharge status: Home / Self Care | Payer: BC Managed Care – PPO | Source: Ambulatory Visit | Attending: Obstetrics and Gynecology | Admitting: Obstetrics and Gynecology

## 2022-12-10 DIAGNOSIS — N6310 Unspecified lump in the right breast, unspecified quadrant: Secondary | ICD-10-CM

## 2022-12-10 DIAGNOSIS — N6001 Solitary cyst of right breast: Secondary | ICD-10-CM | POA: Diagnosis not present

## 2022-12-10 DIAGNOSIS — N6311 Unspecified lump in the right breast, upper outer quadrant: Secondary | ICD-10-CM | POA: Diagnosis not present

## 2022-12-10 DIAGNOSIS — R928 Other abnormal and inconclusive findings on diagnostic imaging of breast: Secondary | ICD-10-CM

## 2022-12-11 DIAGNOSIS — F4311 Post-traumatic stress disorder, acute: Secondary | ICD-10-CM | POA: Diagnosis not present

## 2022-12-16 DIAGNOSIS — M9902 Segmental and somatic dysfunction of thoracic region: Secondary | ICD-10-CM | POA: Diagnosis not present

## 2022-12-16 DIAGNOSIS — M9901 Segmental and somatic dysfunction of cervical region: Secondary | ICD-10-CM | POA: Diagnosis not present

## 2022-12-16 DIAGNOSIS — R519 Headache, unspecified: Secondary | ICD-10-CM | POA: Diagnosis not present

## 2022-12-16 DIAGNOSIS — M26619 Adhesions and ankylosis of temporomandibular joint, unspecified side: Secondary | ICD-10-CM | POA: Diagnosis not present

## 2022-12-18 DIAGNOSIS — F4311 Post-traumatic stress disorder, acute: Secondary | ICD-10-CM | POA: Diagnosis not present

## 2022-12-23 ENCOUNTER — Ambulatory Visit: Payer: BC Managed Care – PPO | Admitting: Psychiatry

## 2022-12-23 ENCOUNTER — Ambulatory Visit: Payer: BC Managed Care – PPO | Admitting: Neurology

## 2022-12-27 DIAGNOSIS — F4311 Post-traumatic stress disorder, acute: Secondary | ICD-10-CM | POA: Diagnosis not present

## 2023-01-09 DIAGNOSIS — M9901 Segmental and somatic dysfunction of cervical region: Secondary | ICD-10-CM | POA: Diagnosis not present

## 2023-01-09 DIAGNOSIS — M9902 Segmental and somatic dysfunction of thoracic region: Secondary | ICD-10-CM | POA: Diagnosis not present

## 2023-01-09 DIAGNOSIS — M26619 Adhesions and ankylosis of temporomandibular joint, unspecified side: Secondary | ICD-10-CM | POA: Diagnosis not present

## 2023-01-09 DIAGNOSIS — R519 Headache, unspecified: Secondary | ICD-10-CM | POA: Diagnosis not present

## 2023-01-10 DIAGNOSIS — F4311 Post-traumatic stress disorder, acute: Secondary | ICD-10-CM | POA: Diagnosis not present

## 2023-01-13 ENCOUNTER — Telehealth: Payer: Self-pay | Admitting: Psychiatry

## 2023-01-13 DIAGNOSIS — G43119 Migraine with aura, intractable, without status migrainosus: Secondary | ICD-10-CM

## 2023-01-13 MED ORDER — UBRELVY 100 MG PO TABS: 100.0000 mg | ORAL_TABLET | ORAL | 1 refills | Status: DC | PRN

## 2023-01-13 NOTE — Telephone Encounter (Signed)
Follow up scheduled on 03/16/22 Last seen on 04/10/22 Rx sent

## 2023-01-13 NOTE — Telephone Encounter (Signed)
Pt has a f/u scheduled and is on wait list, pt is asking if she can get refills on her Ubrogepant (UBRELVY) 100 MG TABS to [email or web address] until appointment date

## 2023-01-17 DIAGNOSIS — F4311 Post-traumatic stress disorder, acute: Secondary | ICD-10-CM | POA: Diagnosis not present

## 2023-02-10 ENCOUNTER — Inpatient Hospital Stay: Payer: Self-pay | Admitting: Hematology and Oncology

## 2023-02-14 ENCOUNTER — Ambulatory Visit: Payer: Self-pay | Admitting: Family Medicine

## 2023-03-17 ENCOUNTER — Encounter: Payer: Self-pay | Admitting: Neurology

## 2023-03-17 ENCOUNTER — Ambulatory Visit: Payer: 59 | Admitting: Neurology

## 2023-03-17 VITALS — BP 111/66 | HR 71 | Ht 66.0 in | Wt 144.0 lb

## 2023-03-17 DIAGNOSIS — G43119 Migraine with aura, intractable, without status migrainosus: Secondary | ICD-10-CM | POA: Diagnosis not present

## 2023-03-17 DIAGNOSIS — G43709 Chronic migraine without aura, not intractable, without status migrainosus: Secondary | ICD-10-CM | POA: Diagnosis not present

## 2023-03-17 DIAGNOSIS — M25512 Pain in left shoulder: Secondary | ICD-10-CM

## 2023-03-17 DIAGNOSIS — G43809 Other migraine, not intractable, without status migrainosus: Secondary | ICD-10-CM

## 2023-03-17 DIAGNOSIS — G8929 Other chronic pain: Secondary | ICD-10-CM

## 2023-03-17 DIAGNOSIS — R6884 Jaw pain: Secondary | ICD-10-CM

## 2023-03-17 MED ORDER — CYCLOBENZAPRINE HCL 10 MG PO TABS: 10.0000 mg | ORAL_TABLET | Freq: Three times a day (TID) | ORAL | 6 refills | Status: AC | PRN

## 2023-03-17 MED ORDER — ONDANSETRON 4 MG PO TBDP: 4.0000 mg | ORAL_TABLET | Freq: Three times a day (TID) | ORAL | 6 refills | Status: AC | PRN

## 2023-03-17 MED ORDER — NORTRIPTYLINE HCL 10 MG PO CAPS: 20.0000 mg | ORAL_CAPSULE | Freq: Every day | ORAL | 11 refills | Status: AC

## 2023-03-17 MED ORDER — UBRELVY 100 MG PO TABS: 100.0000 mg | ORAL_TABLET | ORAL | 6 refills | Status: DC | PRN

## 2023-03-17 NOTE — Progress Notes (Signed)
 Chief Complaint  Patient presents with   Follow-up    Pt in 14, here with husband Elizabeth Contreras  Dr Billy Bue pt, pt is here for a migraine follow up. Pt states her migraines have been getting somewhat worse this past month, states she is getting about 7 migraines a month. Pt states she takes Ubrelvy  and flexeril . States she has been taking the flexeril  every night and has noticed a decrease in intensity.       ASSESSMENT AND PLAN  Elizabeth Contreras is a 43 y.o. female   Chronic migraine  Add on preventive medication nortriptyline , 10 mg titrating to 20 mg every night  Ubrelvy  as needed, may combine with Aleve, Zofran , Flexeril  for prolonged severe headaches  Return To Clinic With NP In 12 Months   DIAGNOSTIC DATA (LABS, IMAGING, TESTING) - I reviewed patient records, labs, notes, testing and imaging myself where available.   MEDICAL HISTORY:  Elizabeth Contreras, is a 43 year old female accompanied by her husband follow-up for migraine headache, was seen by Dr. Billy Bue a year ago, her primary care is Dr. Arliss Lam, Cathleen Coach    History is obtained from the patient and review of electronic medical records. I personally reviewed pertinent available imaging films in PACS.   PMHx of  Anxiety Chronic migraine  She began to have migraine headaches around age 80, typical migraine left retro-orbital area pounding headache with light, noise sensitivity, movement made it worse, can last for hours, or longer  Previously tried few preventive medications, atenolol caused lightheadedness, Topamax  cognitive side effect, Nurtec every other day lack of efficacy,  For rescue therapy, she has tried and failed multiple triptan, Imitrex caused palpitation, lack of benefit with Maxalt, Zomig nasal spray,  Trigger for her migraine headaches are stress, weather change, menstruation, strong smells, bright light,  She is now using Ubrelvy  as needed for rescue therapy, works well most of the time, over the past couple  months, she noticed increased frequency of headache at least couple times each month, also have some anxiety, using Xanax  as needed  She only has visual aura occasionally preceding her headache    Had normal CT head in March 2024, CT angiogram of the brain in April 2024 PHYSICAL EXAM:   Vitals:   03/17/23 0845  BP: 111/66  Pulse: 71  Weight: 144 lb (65.3 kg)  Height: 5\' 6"  (1.676 m)      Body mass index is 23.24 kg/m.  PHYSICAL EXAMNIATION:  Gen: NAD, conversant, well nourised, well groomed                     Cardiovascular: Regular rate rhythm, no peripheral edema, warm, nontender. Eyes: Conjunctivae clear without exudates or hemorrhage Neck: Supple, no carotid bruits. Pulmonary: Clear to auscultation bilaterally   NEUROLOGICAL EXAM:  MENTAL STATUS: Speech/cognition: Awake, alert, oriented to history taking and casual conversation CRANIAL NERVES: CN II: Visual fields are full to confrontation. Pupils are round equal and briskly reactive to light. CN III, IV, VI: extraocular movement are normal. No ptosis. CN V: Facial sensation is intact to light touch CN VII: Face is symmetric with normal eye closure  CN VIII: Hearing is normal to causal conversation. CN IX, X: Phonation is normal. CN XI: Head turning and shoulder shrug are intact  MOTOR: There is no pronator drift of out-stretched arms. Muscle bulk and tone are normal. Muscle strength is normal.  REFLEXES: Reflexes are 2+ and symmetric at the biceps, triceps, knees, and ankles. Plantar responses  are flexor.  SENSORY: Intact to light touch, pinprick and vibratory sensation are intact in fingers and toes.  COORDINATION: There is no trunk or limb dysmetria noted.  GAIT/STANCE: Posture is normal. Gait is steady with normal steps, base, arm swing, and turning. Heel and toe walking are normal. Tandem gait is normal.  Romberg is absent.  REVIEW OF SYSTEMS:  Full 14 system review of systems performed and notable  only for as above All other review of systems were negative.   ALLERGIES: Allergies  Allergen Reactions   Topamax  [Topiramate ]     Cognitive slowing    HOME MEDICATIONS: Current Outpatient Medications  Medication Sig Dispense Refill   acetaminophen  (TYLENOL ) 325 MG tablet Take 650 mg by mouth every 6 (six) hours as needed.     ALPRAZolam  (XANAX ) 0.25 MG tablet Take 1 tablet (0.25 mg total) by mouth 2 (two) times daily as needed for anxiety. 20 tablet 0   Ashwagandha 500 MG CAPS      cetirizine  (ZYRTEC ) 10 MG tablet Take 1 tablet (10 mg total) by mouth daily. 30 tablet 11   Cholecalciferol (VITAMIN D ) 2000 units CAPS Take by mouth.     cyclobenzaprine  (FLEXERIL ) 5 MG tablet TAKE 1 TABLET BY MOUTH THREE TIMES A DAY AS NEEDED FOR MUSCLE SPASMS 30 tablet 1   Galcanezumab -gnlm (EMGALITY ) 120 MG/ML SOAJ Inject 1 Pen into the skin every 30 (thirty) days. 1.12 mL 8   ibuprofen (ADVIL,MOTRIN) 200 MG tablet Take 200 mg by mouth every 6 (six) hours as needed.     Krill Oil (OMEGA-3) 500 MG CAPS      Magnesium 500 MG TABS      ondansetron  (ZOFRAN ) 4 MG tablet Take 1 tablet (4 mg total) by mouth every 8 (eight) hours as needed for nausea or vomiting. 20 tablet 0   PROAIR  HFA 108 (90 Base) MCG/ACT inhaler INHALE 2 PUFFS EVERY 6 HOURS AS NEEDED FOR WHEEZE OR SHORTNESS OF BREATH 8.5 Inhaler 1   Probiotic Product (PROBIOTIC PO)      Turmeric 400 MG CAPS Take by mouth.     Ubrogepant  (UBRELVY ) 100 MG TABS Take 1 tablet (100 mg total) by mouth as needed (for migraine). May repeat a dose in 2 hours if needed. Max dose 2 pills in 24 hours 16 tablet 1   predniSONE  (DELTASONE ) 10 MG tablet Take 5 tabs on day 1, 4 tabs on day 2, 3 tabs on day 3, 2 tabs on day 4, 1 tab on day 5. (Patient not taking: Reported on 03/17/2023) 15 tablet 0   valACYclovir  (VALTREX ) 500 MG tablet Take 1 tablet (500 mg total) by mouth daily. (Patient not taking: Reported on 03/17/2023) 90 tablet 3   No current facility-administered  medications for this visit.    PAST MEDICAL HISTORY: Past Medical History:  Diagnosis Date   Anxiety    Asthma    Common migraine with intractable migraine 06/20/2016   Herpes    Migraine without aura    PCOS (polycystic ovarian syndrome)    PONV (postoperative nausea and vomiting)    PTSD (post-traumatic stress disorder) 2015   Sinusitis    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    Thyroid  disease     PAST SURGICAL HISTORY: Past Surgical History:  Procedure Laterality Date   COLPOSCOPY     NASAL SINUS SURGERY  07/2015   SHOULDER ARTHROSCOPY WITH BICEPS TENDON REPAIR Left 07/21/2019   Procedure: LEFT SHOULDER ARTHROSCOPY WITH BICEPS TENODESIS  WITH DEBRIDEMENT OF LABRUM;  Surgeon: Wes Hamman, MD;  Location: [facility];  Service: Orthopedics;  Laterality: Left;   TONSILLECTOMY      FAMILY HISTORY: Family History  Problem Relation Age of Onset   Migraines Mother    Aneurysm Mother        Brain   Parkinson's disease Mother    Hyperlipidemia Father    Migraines Sister    Ovarian cancer Maternal Aunt        dx 18s-50s   Colon cancer Paternal Uncle        dx 18s   Lung cancer Maternal Grandfather    Heart disease Paternal Grandmother    Heart disease Paternal Grandfather    COPD Paternal Grandfather    Breast cancer Other        MGGM   Esophageal cancer Neg Hx    Stomach cancer Neg Hx    Rectal cancer Neg Hx     SOCIAL HISTORY: Social History   Socioeconomic History   Marital status: Married    Spouse name: Not on file   Number of children: 0   Years of education: 16   Highest education level: Bachelor's degree (e.g., BA, AB, BS)  Occupational History    Comment: unemployed  Tobacco Use   Smoking status: Never   Smokeless tobacco: Never  Vaping Use   Vaping status: Never Used  Substance and Sexual Activity   Alcohol use: Yes    Comment: rarely   Drug use: No   Sexual activity: Yes    Partners: Male    Birth  control/protection: None  Other Topics Concern   Not on file  Social History Narrative   Lives with husband   Caffeine -tea 1-2 daily   Right handed    Wears contacts    Social Drivers of Corporate investment banker Strain: Low Risk  (08/01/2022)   Overall Financial Resource Strain (CARDIA)    Difficulty of Paying Living Expenses: Not hard at all  Food Insecurity: No Food Insecurity (08/01/2022)   Hunger Vital Sign    Worried About Running Out of Food in the Last Year: Never true    Ran Out of Food in the Last Year: Never true  Transportation Needs: No Transportation Needs (08/01/2022)   PRAPARE - Administrator, Civil Service (Medical): No    Lack of Transportation (Non-Medical): No  Physical Activity: Insufficiently Active (08/01/2022)   Exercise Vital Sign    Days of Exercise per Week: 6 days    Minutes of Exercise per Session: 20 min  Stress: Stress Concern Present (08/01/2022)   [employer] of Occupational Health - Occupational Stress Questionnaire    Feeling of Stress : To some extent  Social Connections: Socially Isolated (08/01/2022)   Social Connection and Isolation Panel [NHANES]    Frequency of Communication with Friends and Family: Once a week    Frequency of Social Gatherings with Friends and Family: Once a week    Attends Religious Services: Never    Database administrator or Organizations: No    Attends Engineer, structural: Not on file    Marital Status: Married  Intimate Partner Violence: Unknown (05/11/2021)   Received from [employer], [facility]   HITS    Physically Hurt: Not on file    Insult or Talk Down To: Not on file    Threaten Physical Harm: Not on file    Scream or Curse: Not on  file      Phebe Brasil, M.D. Ph.D.  [facility] [address], [address] [place], [place] [address] Ph: [phone] Fax: [phone]  CC:  Viola Greulich, MD [address] [place],  [place] [address]  Viola Greulich, MD

## 2023-03-20 ENCOUNTER — Telehealth: Payer: Self-pay | Admitting: Pharmacist

## 2023-03-20 DIAGNOSIS — G43119 Migraine with aura, intractable, without status migrainosus: Secondary | ICD-10-CM

## 2023-03-20 NOTE — Telephone Encounter (Signed)
Pharmacy Patient Advocate Encounter   Received notification from CoverMyMeds that prior authorization for Ubrelvy 100MG  tablets is required/requested.   Insurance verification completed.   The patient is insured through [facility] .   Per test claim: PA required; PA submitted to above mentioned insurance via CoverMyMeds Key/confirmation #/EOC [id number] Status is pending

## 2023-03-20 NOTE — Telephone Encounter (Signed)
[employer] ([place]) Calling for information Bernita Raisin on how many migraines patient has had in a month. Can contact at: [phone]

## 2023-03-25 ENCOUNTER — Other Ambulatory Visit (HOSPITAL_COMMUNITY): Payer: Self-pay

## 2023-03-25 NOTE — Telephone Encounter (Signed)
Pharmacy Patient Advocate Encounter  Received notification from [facility] that Prior Authorization for Ubrelvy 100MG  tablets has been APPROVED from 03/20/2023 to 03/19/2024 with QUANTITY LIMIT of 8 for a 30 day supply.   PA #/Case ID/Reference #: PA Case ID #: [id number]

## 2023-03-26 MED ORDER — UBRELVY 100 MG PO TABS: 100.0000 mg | ORAL_TABLET | ORAL | 6 refills | Status: DC | PRN

## 2023-03-26 NOTE — Addendum Note (Signed)
Addended by: Eather Colas E on: 03/26/2023 11:01 AM   Modules accepted: Orders

## 2023-04-24 ENCOUNTER — Other Ambulatory Visit (HOSPITAL_COMMUNITY): Payer: Self-pay

## 2023-04-25 ENCOUNTER — Other Ambulatory Visit (HOSPITAL_COMMUNITY): Payer: Self-pay

## 2023-04-25 ENCOUNTER — Telehealth: Payer: Self-pay

## 2023-04-25 NOTE — Telephone Encounter (Signed)
 Pharmacy Patient Advocate Encounter   Received notification from CoverMyMeds that prior authorization for Ubrelvy 100MG  tablets is required/requested.   Insurance verification completed.   The patient is insured through  [employer] [place]  .   Per test claim: Refill too soon. PA is not needed at this time. Medication was filled 04/18/2023. Next eligible fill date is 05/11/2023.

## 2023-05-09 ENCOUNTER — Ambulatory Visit (INDEPENDENT_AMBULATORY_CARE_PROVIDER_SITE_OTHER): Payer: Self-pay | Admitting: Family Medicine

## 2023-05-09 ENCOUNTER — Encounter: Payer: Self-pay | Admitting: Family Medicine

## 2023-05-09 VITALS — BP 100/68 | HR 67 | Temp 97.6°F | Ht 66.34 in | Wt 143.6 lb

## 2023-05-09 DIAGNOSIS — Z0000 Encounter for general adult medical examination without abnormal findings: Secondary | ICD-10-CM | POA: Diagnosis not present

## 2023-05-09 DIAGNOSIS — E559 Vitamin D deficiency, unspecified: Secondary | ICD-10-CM

## 2023-05-09 DIAGNOSIS — E782 Mixed hyperlipidemia: Secondary | ICD-10-CM

## 2023-05-09 DIAGNOSIS — Z1159 Encounter for screening for other viral diseases: Secondary | ICD-10-CM

## 2023-05-09 DIAGNOSIS — E038 Other specified hypothyroidism: Secondary | ICD-10-CM | POA: Diagnosis not present

## 2023-05-09 DIAGNOSIS — F4310 Post-traumatic stress disorder, unspecified: Secondary | ICD-10-CM

## 2023-05-09 DIAGNOSIS — F40243 Fear of flying: Secondary | ICD-10-CM | POA: Diagnosis not present

## 2023-05-09 DIAGNOSIS — Z8669 Personal history of other diseases of the nervous system and sense organs: Secondary | ICD-10-CM | POA: Diagnosis not present

## 2023-05-09 LAB — CBC WITH DIFFERENTIAL/PLATELET
Basophils Absolute: 0 K/uL (ref 0.0–0.1)
Basophils Relative: 0.9 % (ref 0.0–3.0)
Eosinophils Absolute: 0.3 K/uL (ref 0.0–0.7)
Eosinophils Relative: 6.2 % — ABNORMAL HIGH (ref 0.0–5.0)
HCT: 38 % (ref 36.0–46.0)
Hemoglobin: 12.7 g/dL (ref 12.0–15.0)
Lymphocytes Relative: 36.1 % (ref 12.0–46.0)
Lymphs Abs: 2 K/uL (ref 0.7–4.0)
MCHC: 33.4 g/dL (ref 30.0–36.0)
MCV: 91.6 fl (ref 78.0–100.0)
Monocytes Absolute: 0.4 K/uL (ref 0.1–1.0)
Monocytes Relative: 6.8 % (ref 3.0–12.0)
Neutro Abs: 2.8 K/uL (ref 1.4–7.7)
Neutrophils Relative %: 50 % (ref 43.0–77.0)
Platelets: 214 K/uL (ref 150.0–400.0)
RBC: 4.15 Mil/uL (ref 3.87–5.11)
RDW: 12.6 % (ref 11.5–15.5)
WBC: 5.5 K/uL (ref 4.0–10.5)

## 2023-05-09 LAB — COMPREHENSIVE METABOLIC PANEL WITH GFR
ALT: 11 U/L (ref 0–35)
AST: 14 U/L (ref 0–37)
Albumin: 4.3 g/dL (ref 3.5–5.2)
Alkaline Phosphatase: 40 U/L (ref 39–117)
BUN: 15 mg/dL (ref 6–23)
CO2: 29 meq/L (ref 19–32)
Calcium: 8.5 mg/dL (ref 8.4–10.5)
Chloride: 102 meq/L (ref 96–112)
Creatinine, Ser: 0.71 mg/dL (ref 0.40–1.20)
GFR: 104.37 mL/min (ref >60.00)
Glucose, Bld: 88 mg/dL (ref 70–99)
Potassium: 4.1 meq/L (ref 3.5–5.1)
Sodium: 138 meq/L (ref 135–145)
Total Bilirubin: 0.5 mg/dL (ref 0.2–1.2)
Total Protein: 7 g/dL (ref 6.0–8.3)

## 2023-05-09 LAB — LIPID PANEL
Cholesterol: 195 mg/dL (ref 0–200)
HDL: 52.6 mg/dL (ref >39.00)
LDL Cholesterol: 132 mg/dL — ABNORMAL HIGH (ref 0–99)
NonHDL: 142.32
Total CHOL/HDL Ratio: 4
Triglycerides: 51 mg/dL (ref 0.0–149.0)
VLDL: 10.2 mg/dL (ref 0.0–40.0)

## 2023-05-09 LAB — VITAMIN D 25 HYDROXY (VIT D DEFICIENCY, FRACTURES): VITD: 40.71 ng/mL (ref 30.00–100.00)

## 2023-05-09 LAB — T4, FREE: Free T4: 0.81 ng/dL (ref 0.60–1.60)

## 2023-05-09 LAB — TSH: TSH: 3.02 u[IU]/mL (ref 0.35–5.50)

## 2023-05-09 LAB — HEMOGLOBIN A1C: Hgb A1c MFr Bld: 5.4 % (ref 4.6–6.5)

## 2023-05-09 LAB — MAGNESIUM: Magnesium: 2.3 mg/dL (ref 1.5–2.5)

## 2023-05-09 MED ORDER — ALPRAZOLAM 0.25 MG PO TABS: 0.2500 mg | ORAL_TABLET | Freq: Two times a day (BID) | ORAL | 0 refills | Status: AC | PRN

## 2023-05-09 NOTE — Progress Notes (Signed)
 Established Patient Office Visit   Subjective  Patient ID: Elizabeth Contreras, female    DOB: [birth year]  Age: 43 y.o. MRN: [id number]  Chief Complaint  Patient presents with   Annual Exam    Patient is a 43 year old female seen for CPE.  Patient is fasting.  Patient states she has been doing well overall, recently got a promotion at work.  Patient still having 6-7-migraines per month.  Would like magnesium and vitamin D checked.  Followed by neurology.  To start Imitrex.  Patient requesting refill on Xanax.  Using sparingly, last refilled in 2022.  Has noticed improvement in anxiety with flying and overall.  Patient inquires if perimenopause contributing to elevation in cholesterol.      Patient Active Problem List   Diagnosis Date Noted   Chronic migraine w/o aura w/o status migrainosus, not intractable 03/17/2023   Genetic testing 12/18/2021   Superior labrum anterior-to-posterior (SLAP) tear of left shoulder 06/04/2019   Anxiety 02/26/2019   PTSD (post-traumatic stress disorder) 02/26/2019   Subclinical hypothyroidism 11/21/2016   Vitamin D deficiency 11/21/2016   Common migraine with intractable migraine 06/20/2016   PCOS (polycystic ovarian syndrome) 02/05/1995   Past Medical History:  Diagnosis Date   Allergy 1987   Anxiety    Asthma    Common migraine with intractable migraine 06/20/2016   Herpes    Migraine without aura    PCOS (polycystic ovarian syndrome)    PONV (postoperative nausea and vomiting)    PTSD (post-traumatic stress disorder) 2015   Sinusitis    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    Thyroid disease    Past Surgical History:  Procedure Laterality Date   COLPOSCOPY     NASAL SINUS SURGERY  07/2015   SHOULDER ARTHROSCOPY WITH BICEPS TENDON REPAIR Left 07/21/2019   Procedure: LEFT SHOULDER ARTHROSCOPY WITH BICEPS TENODESIS WITH DEBRIDEMENT OF LABRUM;  Surgeon: Tarry Kos, MD;  Location: [facility];  Service:  Orthopedics;  Laterality: Left;   TONSILLECTOMY     Social History   Tobacco Use   Smoking status: Never   Smokeless tobacco: Never  Vaping Use   Vaping status: Never Used  Substance Use Topics   Alcohol use: Not Currently    Comment: rarely   Drug use: No   Family History  Problem Relation Age of Onset   Migraines Mother    Aneurysm Mother        Brain   Parkinson's disease Mother    Hyperlipidemia Father    Migraines Sister    Ovarian cancer Maternal Aunt        dx 27s-50s   Cancer Maternal Aunt    Colon cancer Paternal Uncle        dx 30s   Cancer Paternal Uncle    Lung cancer Maternal Grandfather    Heart disease Paternal Grandmother    Diabetes Paternal Grandmother    Heart disease Paternal Grandfather    COPD Paternal Grandfather    Breast cancer Other        MGGM   Esophageal cancer Neg Hx    Stomach cancer Neg Hx    Rectal cancer Neg Hx    Allergies  Allergen Reactions   Topamax [Topiramate]     Cognitive slowing      ROS Negative unless stated above    Objective:     BP 100/68 (BP Location: Left Arm, Patient Position: Sitting, Cuff Size: Normal)   Pulse  67   Temp 97.6 F (36.4 C) (Oral)   Ht 5' 6.34" (1.685 m)   Wt 143 lb 9.6 oz (65.1 kg)   LMP 05/07/2023 (Exact Date)   SpO2 98%   BMI 22.94 kg/m  BP Readings from Last 3 Encounters:  05/09/23 100/68  03/17/23 111/66  08/02/22 98/68   Wt Readings from Last 3 Encounters:  05/09/23 143 lb 9.6 oz (65.1 kg)  03/17/23 144 lb (65.3 kg)  08/02/22 142 lb 9.6 oz (64.7 kg)      Physical Exam Constitutional:      Appearance: Normal appearance.  HENT:     Head: Normocephalic and atraumatic.     Right Ear: Tympanic membrane, ear canal and external ear normal.     Left Ear: Tympanic membrane, ear canal and external ear normal.     Nose: Nose normal.     Mouth/Throat:     Mouth: Mucous membranes are moist.     Pharynx: No oropharyngeal exudate or posterior oropharyngeal erythema.  Eyes:      General: No scleral icterus.    Extraocular Movements: Extraocular movements intact.     Conjunctiva/sclera: Conjunctivae normal.     Pupils: Pupils are equal, round, and reactive to light.  Neck:     Thyroid: No thyromegaly.  Cardiovascular:     Rate and Rhythm: Normal rate and regular rhythm.     Pulses: Normal pulses.     Heart sounds: Normal heart sounds. No murmur heard.    No friction rub.  Pulmonary:     Effort: Pulmonary effort is normal.     Breath sounds: Normal breath sounds. No wheezing, rhonchi or rales.  Abdominal:     General: Bowel sounds are normal.     Palpations: Abdomen is soft.     Tenderness: There is no abdominal tenderness.  Musculoskeletal:        General: No deformity. Normal range of motion.  Lymphadenopathy:     Cervical: No cervical adenopathy.  Skin:    General: Skin is warm and dry.     Findings: No lesion.  Neurological:     General: No focal deficit present.     Mental Status: She is alert and oriented to person, place, and time.  Psychiatric:        Mood and Affect: Mood normal.        Thought Content: Thought content normal.       05/09/2023    9:07 AM 08/02/2022    9:40 AM 10/22/2021    9:11 AM  Depression screen PHQ 2/9  Decreased Interest 0 0 0  Down, Depressed, Hopeless 0 0 0  PHQ - 2 Score 0 0 0  Altered sleeping 1 1 1   Tired, decreased energy 1 1 1   Change in appetite 0 0 0  Feeling bad or failure about yourself  1 0 0  Trouble concentrating 0 0 1  Moving slowly or fidgety/restless 0 0 0  Suicidal thoughts 0 0 0  PHQ-9 Score 3 2 3   Difficult doing work/chores Somewhat difficult Somewhat difficult Somewhat difficult      05/09/2023    9:08 AM 08/02/2022    9:40 AM 11/24/2019    3:13 PM  GAD 7 : Generalized Anxiety Score  Nervous, Anxious, on Edge 0 1 1  Control/stop worrying 0 1 1  Worry too much - different things 1 1 1   Trouble relaxing 0 1 0  Restless 0 0 0  Easily annoyed or irritable 0 1  0  Afraid - awful might  happen 0 0 0  Total GAD 7 Score 1 5 3   Anxiety Difficulty Not difficult at all Somewhat difficult Not difficult at all     No results found for any visits on 05/09/23.    Assessment & Plan:  Well adult exam -     CBC with Differential/Platelet; Future -     Comprehensive metabolic panel with GFR; Future -     Hemoglobin A1c; Future -     Lipid panel; Future -     Magnesium; Future  Subclinical hypothyroidism -     TSH; Future  Anxiety with flying -     TSH; Future -     ALPRAZolam; Take 1 tablet (0.25 mg total) by mouth 2 (two) times daily as needed for anxiety.  Dispense: 20 tablet; Refill: 0  Vitamin D deficiency -     VITAMIN D 25 Hydroxy (Vit-D Deficiency, Fractures); Future  PTSD (post-traumatic stress disorder) -     ALPRAZolam; Take 1 tablet (0.25 mg total) by mouth 2 (two) times daily as needed for anxiety.  Dispense: 20 tablet; Refill: 0  Encounter for hepatitis C screening test for low risk patient -     Hepatitis C antibody  Mixed hyperlipidemia -     Lipid panel; Future -     Magnesium; Future  Hx of migraines -     CBC with Differential/Platelet; Future -     Comprehensive metabolic panel with GFR; Future -     Magnesium; Future   Age-appropriate health screenings discussed.  Will obtain labs.  Immunizations reviewed and up-to-date.  Pap up-to-date done 10/02/2021.  Mammogram done 12/10/2022.  Colonoscopy done 06/27/2022.  Next CPE in 1 year.  Xanax refilled.  PMP reviewed and appropriate.  Continue lifestyle modifications.  Continue follow-up with neurology and current medications for migraines.  Return if symptoms worsen or fail to improve.   Deeann Saint, MD

## 2023-05-10 LAB — HEPATITIS C ANTIBODY: Hepatitis C Ab: NONREACTIVE

## 2023-05-16 ENCOUNTER — Encounter: Payer: Self-pay | Admitting: Family Medicine

## 2023-08-04 ENCOUNTER — Ambulatory Visit: Payer: Self-pay | Admitting: *Deleted

## 2023-08-04 ENCOUNTER — Ambulatory Visit: Admitting: Family Medicine

## 2023-08-04 ENCOUNTER — Encounter: Payer: Self-pay | Admitting: Family Medicine

## 2023-08-04 VITALS — BP 110/78 | HR 76 | Temp 98.2°F | Wt 141.4 lb

## 2023-08-04 DIAGNOSIS — R220 Localized swelling, mass and lump, head: Secondary | ICD-10-CM

## 2023-08-04 NOTE — Progress Notes (Signed)
 Established Patient Office Visit  Subjective   Patient ID: Elizabeth Contreras, female    DOB: 10-29-1980  Age: 43 y.o. 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  Elizabeth Contreras is seen today with left lower jaw pain and swelling different from her TMJ symptoms.  Elizabeth Contreras is followed by TMJ specialist and has a mouthguard to use at night.  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  Occasionally takes Flexeril  at night.  Elizabeth Contreras states this last week Elizabeth Contreras was at her dentist and had x-rays done and a cleaning and there was no evidence for any dental decay.  Elizabeth Contreras did notice little bit of mild swelling especially after eating left parotid region.  No warmth or erythema.  No sore throat.  No neck adenopathy.  Denies any severe or extremely sharp pain.  Past Medical History:  Diagnosis Date   Allergy 1987   Anxiety    Asthma    Common migraine with intractable migraine 06/20/2016   Herpes    Migraine without aura    PCOS (polycystic ovarian syndrome)    PONV (postoperative nausea and vomiting)    PTSD (post-traumatic stress disorder) 2015   Sinusitis    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    Thyroid  disease    Past Surgical History:  Procedure Laterality Date   COLPOSCOPY     NASAL SINUS SURGERY  07/2015   SHOULDER ARTHROSCOPY WITH BICEPS TENDON REPAIR Left 07/21/2019   Procedure: LEFT SHOULDER ARTHROSCOPY WITH BICEPS TENODESIS WITH DEBRIDEMENT OF LABRUM;  Surgeon: Jerri Kay HERO, MD;  Location: [facility];  Service: Orthopedics;  Laterality: Left;   TONSILLECTOMY      reports that Elizabeth Contreras has never smoked. Elizabeth Contreras has never used smokeless tobacco. Elizabeth Contreras reports that Elizabeth Contreras does not currently use alcohol. Elizabeth Contreras reports that Elizabeth Contreras does not use drugs. family history includes Aneurysm in her mother; Breast cancer in an other family member; COPD  in her paternal grandfather; Cancer in her maternal aunt and paternal uncle; Colon cancer in her paternal uncle; Diabetes in her paternal grandmother; Heart disease in her paternal grandfather and paternal grandmother; Hyperlipidemia in her father; Lung cancer in her maternal grandfather; Migraines in her mother and sister; Ovarian cancer in her maternal aunt; Parkinson's disease in her mother. Allergies  Allergen Reactions   Topamax  [Topiramate ]     Cognitive slowing    Review of Systems  Constitutional:  Negative for chills and fever.  HENT:  Negative for ear pain and sore throat.   Skin:  Negative for rash.      Objective:     BP 110/78 (BP Location: Left Arm, Patient Position: Sitting, Cuff Size: Normal)   Pulse 76   Temp 98.2 F (36.8 C) (Oral)   Wt 141 lb 6.4 oz (64.1 kg)   SpO2 99%   BMI 22.59 kg/m  BP Readings from Last 3 Encounters:  08/04/23 110/78  05/09/23 100/68  03/17/23 111/66   Wt Readings from Last 3 Encounters:  08/04/23 141 lb 6.4 oz (64.1 kg)  05/09/23 143 lb 9.6 oz (65.1 kg)  03/17/23 144 lb (65.3 kg)      Physical Exam Vitals reviewed.  Constitutional:      General: Elizabeth Contreras is not in acute distress.  Appearance: Elizabeth Contreras is not ill-appearing.  HENT:     Head:     Comments: Left parotid region reveals no masses.  No left submandibular mass.  No warmth or erythema.  No localizing tenderness.    Left Ear: Tympanic membrane and ear canal normal.     Mouth/Throat:     Mouth: Mucous membranes are moist.     Pharynx: Oropharynx is clear. No oropharyngeal exudate or posterior oropharyngeal erythema.     Comments: Stensen's duct appears normal on the left  Cardiovascular:     Rate and Rhythm: Normal rate and regular rhythm.   Musculoskeletal:     Cervical back: Neck supple.  Lymphadenopathy:     Cervical: No cervical adenopathy.   Neurological:     Mental Status: Elizabeth Contreras is alert.      No results found for any visits on 08/04/23.    The 10-year  ASCVD risk score (Arnett DK, et al., 2019) is: 0.5%    Assessment & Plan:   Mild left facial swelling.  Patient has known history of TMJ but states this is different.  Elizabeth Contreras does not have any signs of infection.  No submandibular or parotid masses palpated.  No neck adenopathy.  Could have very small stone or possibly some sludge in the duct draining the parotid gland.  No evidence clinically to suggest likely trigeminal neuralgia.  Elizabeth Contreras had recent dental evaluation with no evidence for dental caries.  -Recommend focusing on good hydration - Consider sialagogues such as lemon drops - Watch closely for any signs of infection such as redness or warmth.  Wolm Scarlet, MD

## 2023-08-04 NOTE — Telephone Encounter (Signed)
 Copied from CRM [id number]. Topic: Clinical - Red Word Triage >> Aug 04, 2023  8:05 AM Suzen RAMAN wrote: Red Word that prompted transfer to Nurse Triage: pain and swelling in lower left jaw Reason for Disposition  [1] MODERATE pain (e.g., interferes with normal activities) AND [2] constant AND [3] present > 24 hours  Answer Assessment - Initial Assessment Questions 1. ONSET: When did the pain start? 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   For the last month this has felt different.   It's not responding to Aleve.    2. ONSET: Does the pain come and go, or has it been constant since it started? (e.g., constant, intermittent, fleeting)     This last month.   The last few days it's gotten really bad the pain and it's swollen on left lower jaw and tender to the touch.    3. SEVERITY: How bad is the pain?   (Scale 1-10; mild, moderate or severe)   - MILD (1-3): doesn't interfere with normal activities    - MODERATE (4-7): interferes with normal activities or awakens from sleep    - SEVERE (8-10): excruciating pain, unable to do any normal activities      7-8/10 at worst 4. LOCATION: Where does it hurt?      Left lower jaw  5. RASH: Is there any redness, rash, or swelling of the face?     Swelling of jaw. I saw the dentist Tues. And everything was fine. 6. FEVER: Do you have a fever? If Yes, ask: What is it, how was it measured, and when did it start?      Not asked 7. OTHER SYMPTOMS: Do you have any other symptoms? (e.g., fever, toothache, nasal discharge, nasal congestion, clicking sensation in jaw joint)     No 8. PREGNANCY: Is there any chance you are pregnant? When was your last menstrual period?     Not asked  Protocols used: Face Pain-A-AH FYI Only or Action Required?: FYI only for provider.  Patient was last seen in primary care on 05/09/2023 by Mercer [name] SAUNDERS, MD. Called Nurse Triage reporting Jaw Pain. Symptoms began about a month ago. Interventions  attempted: OTC medications: ibuprofen. Symptoms are: gradually worsening.  Triage Disposition: See Physician Within 24 Hours  Patient/caregiver understands and will follow disposition?: Yes

## 2023-08-04 NOTE — Telephone Encounter (Signed)
 Saw Dr. Micheal.

## 2023-08-04 NOTE — Patient Instructions (Signed)
?   Salivary gland (parotid) duct obstruction from stone or sludge  Stay well hydrated.    Watch for any signs of infection such as redness or warmth.

## 2023-11-05 ENCOUNTER — Other Ambulatory Visit: Payer: Self-pay | Admitting: Obstetrics and Gynecology

## 2023-11-05 DIAGNOSIS — Z1231 Encounter for screening mammogram for malignant neoplasm of breast: Secondary | ICD-10-CM

## 2023-11-14 ENCOUNTER — Ambulatory Visit: Admitting: Family Medicine

## 2023-11-14 VITALS — BP 100/70 | HR 63 | Temp 97.6°F | Wt 142.2 lb

## 2023-11-14 DIAGNOSIS — J069 Acute upper respiratory infection, unspecified: Secondary | ICD-10-CM

## 2023-11-14 NOTE — Progress Notes (Signed)
 Established Patient Office Visit  Subjective   Patient ID: Elizabeth Contreras, female    DOB: 04-01-1980  Age: 43 y.o. MRN: [id number]  Chief Complaint  Patient presents with   Cough   Nasal Congestion   Ear Fullness    HPI   [name] is seen today for recent respiratory illness.  She started about a week and a half ago with some cough and nasal congestion.  Home COVID test negative.  She has had no fever.  Overall feels better but has sensation of bilateral ear fullness and still has some cough productive of yellow sputum intermittently.  No facial pain or headaches.  Generally healthy.  Non-smoker.  Past Medical History:  Diagnosis Date   Allergy 1987   Anxiety    Asthma    Common migraine with intractable migraine 06/20/2016   Herpes    Migraine without aura    PCOS (polycystic ovarian syndrome)    PONV (postoperative nausea and vomiting)    PTSD (post-traumatic stress disorder) 2015   Sinusitis    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    Thyroid  disease    Past Surgical History:  Procedure Laterality Date   COLPOSCOPY     NASAL SINUS SURGERY  07/2015   SHOULDER ARTHROSCOPY WITH BICEPS TENDON REPAIR Left 07/21/2019   Procedure: LEFT SHOULDER ARTHROSCOPY WITH BICEPS TENODESIS WITH DEBRIDEMENT OF LABRUM;  Surgeon: Jerri Kay HERO, MD;  Location: [facility];  Service: Orthopedics;  Laterality: Left;   TONSILLECTOMY      reports that she has never smoked. She has never used smokeless tobacco. She reports that she does not currently use alcohol. She reports that she does not use drugs. family history includes Aneurysm in her mother; Breast cancer in an other family member; COPD in her paternal grandfather; Cancer in her maternal aunt and paternal uncle; Colon cancer in her paternal uncle; Diabetes in her paternal grandmother; Heart disease in her paternal grandfather and paternal grandmother; Hyperlipidemia in her father; Lung cancer in her maternal  grandfather; Migraines in her mother and sister; Ovarian cancer in her maternal aunt; Parkinson's disease in her mother. Allergies  Allergen Reactions   Topamax  [Topiramate ]     Cognitive slowing    Review of Systems  Constitutional:  Negative for chills and fever.  HENT:  Positive for congestion. Negative for hearing loss.   Respiratory:  Positive for cough.       Objective:     BP 100/70   Pulse 63   Temp 97.6 F (36.4 C) (Oral)   Wt 142 lb 3.2 oz (64.5 kg)   SpO2 96%   BMI 22.72 kg/m  BP Readings from Last 3 Encounters:  11/14/23 100/70  08/04/23 110/78  05/09/23 100/68   Wt Readings from Last 3 Encounters:  11/14/23 142 lb 3.2 oz (64.5 kg)  08/04/23 141 lb 6.4 oz (64.1 kg)  05/09/23 143 lb 9.6 oz (65.1 kg)      Physical Exam Vitals reviewed.  Constitutional:      General: She is not in acute distress.    Appearance: She is not ill-appearing.  HENT:     Right Ear: Tympanic membrane normal.     Left Ear: Tympanic membrane normal.     Mouth/Throat:     Mouth: Mucous membranes are moist.     Pharynx: Oropharynx is clear. No oropharyngeal exudate or posterior oropharyngeal erythema.  Cardiovascular:     Rate and Rhythm: Normal rate and  regular rhythm.  Pulmonary:     Effort: Pulmonary effort is normal.     Breath sounds: Normal breath sounds. No wheezing or rales.  Musculoskeletal:     Cervical back: Neck supple.  Lymphadenopathy:     Cervical: No cervical adenopathy.  Neurological:     Mental Status: She is alert.      No results found for any visits on 11/14/23.    The 10-year ASCVD risk score (Arnett DK, et al., 2019) is: 0.4%    Assessment & Plan:   Upper respiratory infection with cough.  Suspect viral.  Nonfocal exam.  No evidence for otitis media and lung exam clear.  She is afebrile.  Suspect she has some residual cough from recent viral process.  We did offer cough syrup but she declines at this time.  She feels like she is managing this  fairly well.  Continue plenty of fluids and rest.  Follow-up immediately for any fever or any other concerns  Wolm Scarlet, MD

## 2023-12-11 ENCOUNTER — Ambulatory Visit
Admission: RE | Admit: 2023-12-11 | Discharge: 2023-12-11 | Disposition: A | Source: Ambulatory Visit | Attending: Obstetrics and Gynecology | Admitting: Obstetrics and Gynecology

## 2023-12-11 DIAGNOSIS — Z1231 Encounter for screening mammogram for malignant neoplasm of breast: Secondary | ICD-10-CM

## 2023-12-15 ENCOUNTER — Encounter: Payer: Self-pay | Admitting: Family Medicine

## 2023-12-15 ENCOUNTER — Telehealth: Payer: Self-pay | Admitting: Neurology

## 2023-12-15 NOTE — Telephone Encounter (Signed)
 Pt made Request for an appointment for new issue, it was explained to pt that every diagnosis requires a referral of its own.

## 2024-01-23 ENCOUNTER — Other Ambulatory Visit: Payer: Self-pay | Admitting: Family Medicine

## 2024-01-23 DIAGNOSIS — G500 Trigeminal neuralgia: Secondary | ICD-10-CM

## 2024-02-18 ENCOUNTER — Other Ambulatory Visit: Payer: Self-pay | Admitting: Obstetrics and Gynecology

## 2024-02-18 DIAGNOSIS — R92343 Mammographic extreme density, bilateral breasts: Secondary | ICD-10-CM

## 2024-03-04 ENCOUNTER — Encounter: Payer: Self-pay | Admitting: Neurology

## 2024-03-04 ENCOUNTER — Ambulatory Visit: Admitting: Neurology

## 2024-03-04 VITALS — BP 98/63 | HR 78 | Ht 66.0 in | Wt 150.0 lb

## 2024-03-04 DIAGNOSIS — G43709 Chronic migraine without aura, not intractable, without status migrainosus: Secondary | ICD-10-CM

## 2024-03-04 DIAGNOSIS — G43119 Migraine with aura, intractable, without status migrainosus: Secondary | ICD-10-CM | POA: Diagnosis not present

## 2024-03-04 MED ORDER — PROPRANOLOL HCL ER 80 MG PO CP24: 80.0000 mg | ORAL_CAPSULE | Freq: Every day | ORAL | 11 refills | Status: AC

## 2024-03-04 MED ORDER — UBRELVY 100 MG PO TABS: 100.0000 mg | ORAL_TABLET | ORAL | 11 refills | Status: AC | PRN

## 2024-03-04 NOTE — Progress Notes (Signed)
 "  Chief Complaint  Patient presents with   Follow-up    Pt in room 14. Alone. Internal referral for Trigeminal neuralgia       ASSESSMENT AND PLAN  Elizabeth Contreras is a 44 y.o. female   Chronic migraine Intermittent left jaw pain  Ubrelvy  as needed works well for her migraine  She would want to try different preventive medication, Inderal  LA 80 mg every night  Her intermittent left jaw pain is most likely related related to her left TMJ issues  DIAGNOSTIC DATA (LABS, IMAGING, TESTING) - I reviewed patient records, labs, notes, testing and imaging myself where available.   MEDICAL HISTORY:  Elizabeth Contreras, is a 44 year old female accompanied by her husband follow-up for migraine headache, was seen by Dr. Rush a year ago, her primary care is Dr. Mercer, [name]    History is obtained from the patient and review of electronic medical records. I personally reviewed pertinent available imaging films in PACS.   PMHx of  Anxiety Chronic migraine  She began to have migraine headaches around age 35, typical migraine left retro-orbital area pounding headache with light, noise sensitivity, movement made it worse, can last for hours, or longer  Previously tried few preventive medications, atenolol caused lightheadedness, Topamax  cognitive side effect, Nurtec every other day lack of efficacy,  For rescue therapy, she has tried and failed multiple triptan, Imitrex caused palpitation, lack of benefit with Maxalt, Zomig nasal spray,  Trigger for her migraine headaches are stress, weather change, menstruation, strong smells, bright light,  She is now using Ubrelvy  as needed for rescue therapy, works well most of the time, over the past couple months, she noticed increased frequency of headache at least couple times each month, also have some anxiety, using Xanax  as needed  She only has visual aura occasionally preceding her headache    Had normal CT head in March 2024, CT angiogram of  the brain in April 2024  UPDATE Mar 04 2024: She took nortriptyline  20mg  for 9 months, it did help her much migraine some, at which 6 each month, she went to hold off it few months ago because recovered left jaw pain, radiating pain to left mandibular region, she took Flexeril  few consecutive nights symptoms has improved, during the flareup she also noticed TMJ pain when chew,  Ubrelvy  was approved by her insurance, help her headache most of the time, seems off nortriptyline , she has more headache, 8-9 each month,  Personally reviewed MRI of TMJ, there appeared to be a degree of anterior subluxation of both articular disk of the close the most view with recapture on both disc upon open mouth imaging, imaging also including brainstem, trigeminal ganglion area showed no significant abnormality  PHYSICAL EXAM:   Vitals:   03/04/24 0833  BP: 98/63  Pulse: 78  SpO2: 98%  Weight: 150 lb (68 kg)  Height: 5' 6 (1.676 m)    Body mass index is 24.21 kg/m.  PHYSICAL EXAMNIATION:  Gen: NAD, conversant, well nourised, well groomed                     Cardiovascular: Regular rate rhythm, no peripheral edema, warm, nontender. Eyes: Conjunctivae clear without exudates or hemorrhage Neck: Supple, no carotid bruits. Pulmonary: Clear to auscultation bilaterally   NEUROLOGICAL EXAM:  MENTAL STATUS: Speech/cognition: Awake, alert, oriented to history taking and casual conversation CRANIAL NERVES: CN II: Visual fields are full to confrontation. Pupils are round equal and briskly reactive to light.  CN III, IV, VI: extraocular movement are normal. No ptosis. CN V: Facial sensation is intact to light touch CN VII: Face is symmetric with normal eye closure  CN VIII: Hearing is normal to causal conversation. CN IX, X: Phonation is normal. CN XI: Head turning and shoulder shrug are intact  MOTOR: There is no pronator drift of out-stretched arms. Muscle bulk and tone are normal. Muscle strength is  normal.  REFLEXES: Reflexes are 2+ and symmetric at the biceps, triceps, knees, and ankles. Plantar responses are flexor.  SENSORY: Intact to light touch, pinprick and vibratory sensation are intact in fingers and toes.  COORDINATION: There is no trunk or limb dysmetria noted.  GAIT/STANCE: Posture is normal. Gait is steady   REVIEW OF SYSTEMS:  Full 14 system review of systems performed and notable only for as above All other review of systems were negative.   ALLERGIES: Allergies  Allergen Reactions   Topamax  [Topiramate ]     Cognitive slowing    HOME MEDICATIONS: Current Outpatient Medications  Medication Sig Dispense Refill   acetaminophen  (TYLENOL ) 325 MG tablet Take 650 mg by mouth every 6 (six) hours as needed.     ALPRAZolam  (XANAX ) 0.25 MG tablet Take 1 tablet (0.25 mg total) by mouth 2 (two) times daily as needed for anxiety. 20 tablet 0   cetirizine  (ZYRTEC ) 10 MG tablet Take 1 tablet (10 mg total) by mouth daily. 30 tablet 11   Cholecalciferol (VITAMIN D ) 2000 units CAPS Take by mouth.     cyclobenzaprine  (FLEXERIL ) 10 MG tablet Take 1 tablet (10 mg total) by mouth 3 (three) times daily as needed for muscle spasms. 60 tablet 6   ibuprofen (ADVIL,MOTRIN) 200 MG tablet Take 200 mg by mouth every 6 (six) hours as needed.     Krill Oil (OMEGA-3) 500 MG CAPS      Magnesium 500 MG TABS      ondansetron  (ZOFRAN -ODT) 4 MG disintegrating tablet Take 1 tablet (4 mg total) by mouth every 8 (eight) hours as needed for nausea or vomiting. 20 tablet 6   PROAIR  HFA 108 (90 Base) MCG/ACT inhaler INHALE 2 PUFFS EVERY 6 HOURS AS NEEDED FOR WHEEZE OR SHORTNESS OF BREATH 8.5 Inhaler 1   Probiotic Product (PROBIOTIC PO)      Ubrogepant  (UBRELVY ) 100 MG TABS Take 1 tablet (100 mg total) by mouth as needed (for migraine). May repeat a dose in 2 hours if needed. Max dose 2 pills in 24 hours 8 tablet 6   Ashwagandha 500 MG CAPS      nortriptyline  (PAMELOR ) 10 MG capsule Take 2 capsules  (20 mg total) by mouth at bedtime. 60 capsule 11   Turmeric 400 MG CAPS Take by mouth.     No current facility-administered medications for this visit.    PAST MEDICAL HISTORY: Past Medical History:  Diagnosis Date   Allergy 1987   Anxiety    Asthma    Common migraine with intractable migraine 06/20/2016   Herpes    Migraine without aura    PCOS (polycystic ovarian syndrome)    PONV (postoperative nausea and vomiting)    PTSD (post-traumatic stress disorder) 2015   Sinusitis    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    Thyroid  disease     PAST SURGICAL HISTORY: Past Surgical History:  Procedure Laterality Date   COLPOSCOPY     NASAL SINUS SURGERY  07/2015   SHOULDER ARTHROSCOPY WITH BICEPS TENDON REPAIR Left 07/21/2019  Procedure: LEFT SHOULDER ARTHROSCOPY WITH BICEPS TENODESIS WITH DEBRIDEMENT OF LABRUM;  Surgeon: Jerri Kay HERO, MD;  Location: [facility];  Service: Orthopedics;  Laterality: Left;   TONSILLECTOMY      FAMILY HISTORY: Family History  Problem Relation Age of Onset   Migraines Mother    Aneurysm Mother        Brain   Parkinson's disease Mother    Hyperlipidemia Father    Migraines Sister    Ovarian cancer Maternal Aunt        dx 54s-50s   Cancer Maternal Aunt    Colon cancer Paternal Uncle        dx 56s   Cancer Paternal Uncle    Lung cancer Maternal Grandfather    Heart disease Paternal Grandmother    Diabetes Paternal Grandmother    Heart disease Paternal Grandfather    COPD Paternal Grandfather    Breast cancer Other        MGGM   Esophageal cancer Neg Hx    Stomach cancer Neg Hx    Rectal cancer Neg Hx     SOCIAL HISTORY: Social History   Socioeconomic History   Marital status: Married    Spouse name: Not on file   Number of children: 0   Years of education: 16   Highest education level: Bachelor's degree (e.g., BA, AB, BS)  Occupational History    Comment: unemployed  Tobacco Use   Smoking status:  Never   Smokeless tobacco: Never  Vaping Use   Vaping status: Never Used  Substance and Sexual Activity   Alcohol use: Not Currently    Comment: rarely   Drug use: No   Sexual activity: Yes    Partners: Male    Birth control/protection: None  Other Topics Concern   Not on file  Social History Narrative   Lives with husband   Caffeine -tea 1-2 daily   Right handed    Wears contacts    Social Drivers of Health   Tobacco Use: Low Risk (03/04/2024)   Patient History    Smoking Tobacco Use: Never    Smokeless Tobacco Use: Never    Passive Exposure: Not on file  Financial Resource Strain: Low Risk (11/14/2023)   Overall Financial Resource Strain (CARDIA)    Difficulty of Paying Living Expenses: Not hard at all  Food Insecurity: No Food Insecurity (11/14/2023)   Epic    Worried About Programme Researcher, Broadcasting/film/video in the Last Year: Never true    Ran Out of Food in the Last Year: Never true  Transportation Needs: No Transportation Needs (11/14/2023)   Epic    Lack of Transportation (Medical): No    Lack of Transportation (Non-Medical): No  Physical Activity: Sufficiently Active (11/14/2023)   Exercise Vital Sign    Days of Exercise per Week: 5 days    Minutes of Exercise per Session: 30 min  Stress: Stress Concern Present (11/14/2023)   [employer] of Occupational Health - Occupational Stress Questionnaire    Feeling of Stress: To some extent  Social Connections: Socially Isolated (11/14/2023)   Social Connection and Isolation Panel    Frequency of Communication with Friends and Family: Once a week    Frequency of Social Gatherings with Friends and Family: Once a week    Attends Religious Services: Never    Database Administrator or Organizations: No    Attends Engineer, Structural: Not on file    Marital Status: Married  Intimate  Partner Violence: Unknown (05/11/2021)   Received from [facility]   HITS    Physically Hurt: Not on file    Insult or Talk Down To: Not  on file    Threaten Physical Harm: Not on file    Scream or Curse: Not on file  Depression (PHQ2-9): Low Risk (05/09/2023)   Depression (PHQ2-9)    PHQ-2 Score: 3  Alcohol Screen: Low Risk (05/05/2023)   Alcohol Screen    Last Alcohol Screening Score (AUDIT): 1  Housing: Unknown (11/14/2023)   Epic    Unable to Pay for Housing in the Last Year: No    Number of Times Moved in the Last Year: Not on file    Homeless in the Last Year: No  Utilities: Not on file  Health Literacy: Not on file      Modena Callander, M.D. Ph.D.  [facility] [address], [address] [place], [place] [address] Ph: [phone] Fax: [phone]  CC:  Mercer [name] SAUNDERS, MD [address] [place],  [place] [address]  Mercer [name] SAUNDERS, MD   "

## 2024-03-13 ENCOUNTER — Ambulatory Visit

## 2024-03-22 ENCOUNTER — Ambulatory Visit: Payer: 59 | Admitting: Neurology

## 2024-04-10 ENCOUNTER — Ambulatory Visit

## 2025-03-15 ENCOUNTER — Ambulatory Visit: Admitting: Neurology
